# Patient Record
Sex: Male | Born: 1980 | Race: White | Hispanic: No | Marital: Married | Smoking: Never smoker
Health system: Southern US, Community
[De-identification: ages and names within clinical notes are randomized; demographics above are authoritative.]

## PROBLEM LIST (undated history)

## (undated) DIAGNOSIS — I341 Nonrheumatic mitral (valve) prolapse: Secondary | ICD-10-CM

## (undated) DIAGNOSIS — J45990 Exercise induced bronchospasm: Secondary | ICD-10-CM

## (undated) DIAGNOSIS — I493 Ventricular premature depolarization: Secondary | ICD-10-CM

## (undated) DIAGNOSIS — J111 Influenza due to unidentified influenza virus with other respiratory manifestations: Secondary | ICD-10-CM

## (undated) DIAGNOSIS — G43909 Migraine, unspecified, not intractable, without status migrainosus: Secondary | ICD-10-CM

## (undated) DIAGNOSIS — K635 Polyp of colon: Secondary | ICD-10-CM

## (undated) HISTORY — PX: HERNIA REPAIR: SHX51

## (undated) HISTORY — DX: Polyp of colon: K63.5

## (undated) HISTORY — DX: Exercise induced bronchospasm: J45.990

## (undated) HISTORY — DX: Nonrheumatic mitral (valve) prolapse: I34.1

## (undated) HISTORY — PX: COLONOSCOPY: SHX174

## (undated) HISTORY — DX: Migraine, unspecified, not intractable, without status migrainosus: G43.909

## (undated) HISTORY — PX: WISDOM TOOTH EXTRACTION: SHX21

## (undated) HISTORY — DX: Ventricular premature depolarization: I49.3

---

## 2009-01-29 ENCOUNTER — Emergency Department (HOSPITAL_BASED_OUTPATIENT_CLINIC_OR_DEPARTMENT_OTHER): Admission: EM | Admit: 2009-01-29 | Discharge: 2009-01-29 | Payer: Self-pay | Admitting: Emergency Medicine

## 2009-01-29 ENCOUNTER — Ambulatory Visit: Payer: Self-pay | Admitting: Diagnostic Radiology

## 2009-02-02 DIAGNOSIS — R002 Palpitations: Secondary | ICD-10-CM | POA: Insufficient documentation

## 2009-05-21 ENCOUNTER — Emergency Department (HOSPITAL_BASED_OUTPATIENT_CLINIC_OR_DEPARTMENT_OTHER): Admission: EM | Admit: 2009-05-21 | Discharge: 2009-05-21 | Payer: Self-pay | Admitting: Emergency Medicine

## 2009-05-21 ENCOUNTER — Ambulatory Visit: Payer: Self-pay | Admitting: Diagnostic Radiology

## 2011-03-18 LAB — URINALYSIS, ROUTINE W REFLEX MICROSCOPIC
Glucose, UA: NEGATIVE mg/dL
Hgb urine dipstick: NEGATIVE
Ketones, ur: 15 mg/dL — AB
Protein, ur: NEGATIVE mg/dL
Urobilinogen, UA: 1 mg/dL (ref 0.0–1.0)

## 2011-03-26 LAB — COMPREHENSIVE METABOLIC PANEL
Albumin: 4.4 g/dL (ref 3.5–5.2)
Alkaline Phosphatase: 48 U/L (ref 39–117)
BUN: 23 mg/dL (ref 6–23)
Calcium: 9.4 mg/dL (ref 8.4–10.5)
Creatinine, Ser: 1.2 mg/dL (ref 0.4–1.5)
Glucose, Bld: 117 mg/dL — ABNORMAL HIGH (ref 70–99)
Total Protein: 7.1 g/dL (ref 6.0–8.3)

## 2011-03-26 LAB — CBC
HCT: 42.6 % (ref 39.0–52.0)
Hemoglobin: 14.4 g/dL (ref 13.0–17.0)
MCHC: 33.9 g/dL (ref 30.0–36.0)
MCV: 93.4 fL (ref 78.0–100.0)
Platelets: 205 10*3/uL (ref 150–400)
RDW: 11.8 % (ref 11.5–15.5)

## 2011-03-26 LAB — POCT TOXICOLOGY PANEL

## 2011-03-26 LAB — DIFFERENTIAL
Basophils Relative: 1 % (ref 0–1)
Lymphocytes Relative: 22 % (ref 12–46)
Lymphs Abs: 1.6 10*3/uL (ref 0.7–4.0)
Monocytes Absolute: 0.4 10*3/uL (ref 0.1–1.0)
Monocytes Relative: 6 % (ref 3–12)
Neutro Abs: 5.3 10*3/uL (ref 1.7–7.7)
Neutrophils Relative %: 70 % (ref 43–77)

## 2011-03-26 LAB — POCT CARDIAC MARKERS
CKMB, poc: <1 ng/mL — ABNORMAL LOW (ref 1.0–8.0)
Myoglobin, poc: 51 ng/mL (ref 12–200)
Troponin i, poc: <0.05 ng/mL (ref 0.00–0.09)

## 2012-06-22 DIAGNOSIS — R42 Dizziness and giddiness: Secondary | ICD-10-CM | POA: Insufficient documentation

## 2012-06-22 DIAGNOSIS — Z9109 Other allergy status, other than to drugs and biological substances: Secondary | ICD-10-CM

## 2012-06-22 DIAGNOSIS — Z8669 Personal history of other diseases of the nervous system and sense organs: Secondary | ICD-10-CM

## 2012-06-22 DIAGNOSIS — B9689 Other specified bacterial agents as the cause of diseases classified elsewhere: Secondary | ICD-10-CM | POA: Insufficient documentation

## 2012-06-22 DIAGNOSIS — L0889 Other specified local infections of the skin and subcutaneous tissue: Secondary | ICD-10-CM

## 2012-06-22 HISTORY — DX: Other allergy status, other than to drugs and biological substances: Z91.09

## 2012-06-22 HISTORY — DX: Personal history of other diseases of the nervous system and sense organs: Z86.69

## 2012-10-09 LAB — CBC AND DIFFERENTIAL: Hemoglobin: 15.7 g/dL (ref 13.5–17.5)

## 2012-10-09 LAB — HEPATIC FUNCTION PANEL
ALT: 25 U/L (ref 10–40)
AST: 20 U/L (ref 14–40)

## 2012-10-09 LAB — LIPID PANEL
Cholesterol: 185 mg/dL (ref 0–200)
HDL: 48 mg/dL (ref 35–70)
LDL CALC: 119 mg/dL
Triglycerides: 90 mg/dL (ref 40–160)

## 2012-10-09 LAB — TSH: TSH: 1.82 u[IU]/mL (ref 0.41–5.90)

## 2012-10-09 LAB — BASIC METABOLIC PANEL: CREATININE: 1.1 mg/dL (ref 0.6–1.3)

## 2016-03-11 DIAGNOSIS — M9902 Segmental and somatic dysfunction of thoracic region: Secondary | ICD-10-CM | POA: Diagnosis not present

## 2016-03-11 DIAGNOSIS — J301 Allergic rhinitis due to pollen: Secondary | ICD-10-CM | POA: Diagnosis not present

## 2016-03-11 DIAGNOSIS — M47898 Other spondylosis, sacral and sacrococcygeal region: Secondary | ICD-10-CM | POA: Diagnosis not present

## 2016-03-11 DIAGNOSIS — M9904 Segmental and somatic dysfunction of sacral region: Secondary | ICD-10-CM | POA: Diagnosis not present

## 2016-03-11 DIAGNOSIS — M9903 Segmental and somatic dysfunction of lumbar region: Secondary | ICD-10-CM | POA: Diagnosis not present

## 2016-03-11 DIAGNOSIS — J3089 Other allergic rhinitis: Secondary | ICD-10-CM | POA: Diagnosis not present

## 2016-03-12 DIAGNOSIS — M47898 Other spondylosis, sacral and sacrococcygeal region: Secondary | ICD-10-CM | POA: Diagnosis not present

## 2016-03-12 DIAGNOSIS — M9903 Segmental and somatic dysfunction of lumbar region: Secondary | ICD-10-CM | POA: Diagnosis not present

## 2016-03-12 DIAGNOSIS — M9902 Segmental and somatic dysfunction of thoracic region: Secondary | ICD-10-CM | POA: Diagnosis not present

## 2016-03-12 DIAGNOSIS — M9904 Segmental and somatic dysfunction of sacral region: Secondary | ICD-10-CM | POA: Diagnosis not present

## 2016-03-19 DIAGNOSIS — M9902 Segmental and somatic dysfunction of thoracic region: Secondary | ICD-10-CM | POA: Diagnosis not present

## 2016-03-19 DIAGNOSIS — M9904 Segmental and somatic dysfunction of sacral region: Secondary | ICD-10-CM | POA: Diagnosis not present

## 2016-03-19 DIAGNOSIS — M47898 Other spondylosis, sacral and sacrococcygeal region: Secondary | ICD-10-CM | POA: Diagnosis not present

## 2016-03-19 DIAGNOSIS — M9903 Segmental and somatic dysfunction of lumbar region: Secondary | ICD-10-CM | POA: Diagnosis not present

## 2016-03-28 DIAGNOSIS — M47898 Other spondylosis, sacral and sacrococcygeal region: Secondary | ICD-10-CM | POA: Diagnosis not present

## 2016-03-28 DIAGNOSIS — J3089 Other allergic rhinitis: Secondary | ICD-10-CM | POA: Diagnosis not present

## 2016-03-28 DIAGNOSIS — M9902 Segmental and somatic dysfunction of thoracic region: Secondary | ICD-10-CM | POA: Diagnosis not present

## 2016-03-28 DIAGNOSIS — M9904 Segmental and somatic dysfunction of sacral region: Secondary | ICD-10-CM | POA: Diagnosis not present

## 2016-03-28 DIAGNOSIS — J301 Allergic rhinitis due to pollen: Secondary | ICD-10-CM | POA: Diagnosis not present

## 2016-03-28 DIAGNOSIS — M9903 Segmental and somatic dysfunction of lumbar region: Secondary | ICD-10-CM | POA: Diagnosis not present

## 2016-04-01 DIAGNOSIS — M9904 Segmental and somatic dysfunction of sacral region: Secondary | ICD-10-CM | POA: Diagnosis not present

## 2016-04-01 DIAGNOSIS — M47898 Other spondylosis, sacral and sacrococcygeal region: Secondary | ICD-10-CM | POA: Diagnosis not present

## 2016-04-01 DIAGNOSIS — M9902 Segmental and somatic dysfunction of thoracic region: Secondary | ICD-10-CM | POA: Diagnosis not present

## 2016-04-01 DIAGNOSIS — M9903 Segmental and somatic dysfunction of lumbar region: Secondary | ICD-10-CM | POA: Diagnosis not present

## 2016-04-08 DIAGNOSIS — M9903 Segmental and somatic dysfunction of lumbar region: Secondary | ICD-10-CM | POA: Diagnosis not present

## 2016-04-08 DIAGNOSIS — M9904 Segmental and somatic dysfunction of sacral region: Secondary | ICD-10-CM | POA: Diagnosis not present

## 2016-04-08 DIAGNOSIS — M9902 Segmental and somatic dysfunction of thoracic region: Secondary | ICD-10-CM | POA: Diagnosis not present

## 2016-04-08 DIAGNOSIS — M47898 Other spondylosis, sacral and sacrococcygeal region: Secondary | ICD-10-CM | POA: Diagnosis not present

## 2016-04-10 DIAGNOSIS — J3089 Other allergic rhinitis: Secondary | ICD-10-CM | POA: Diagnosis not present

## 2016-04-10 DIAGNOSIS — J301 Allergic rhinitis due to pollen: Secondary | ICD-10-CM | POA: Diagnosis not present

## 2016-04-25 DIAGNOSIS — M9904 Segmental and somatic dysfunction of sacral region: Secondary | ICD-10-CM | POA: Diagnosis not present

## 2016-04-25 DIAGNOSIS — M47898 Other spondylosis, sacral and sacrococcygeal region: Secondary | ICD-10-CM | POA: Diagnosis not present

## 2016-04-25 DIAGNOSIS — M9902 Segmental and somatic dysfunction of thoracic region: Secondary | ICD-10-CM | POA: Diagnosis not present

## 2016-04-25 DIAGNOSIS — M9903 Segmental and somatic dysfunction of lumbar region: Secondary | ICD-10-CM | POA: Diagnosis not present

## 2016-04-26 DIAGNOSIS — J209 Acute bronchitis, unspecified: Secondary | ICD-10-CM | POA: Diagnosis not present

## 2016-04-26 DIAGNOSIS — J0140 Acute pansinusitis, unspecified: Secondary | ICD-10-CM | POA: Diagnosis not present

## 2016-05-02 DIAGNOSIS — M47898 Other spondylosis, sacral and sacrococcygeal region: Secondary | ICD-10-CM | POA: Diagnosis not present

## 2016-05-02 DIAGNOSIS — M9902 Segmental and somatic dysfunction of thoracic region: Secondary | ICD-10-CM | POA: Diagnosis not present

## 2016-05-02 DIAGNOSIS — M9903 Segmental and somatic dysfunction of lumbar region: Secondary | ICD-10-CM | POA: Diagnosis not present

## 2016-05-02 DIAGNOSIS — M9904 Segmental and somatic dysfunction of sacral region: Secondary | ICD-10-CM | POA: Diagnosis not present

## 2016-05-13 DIAGNOSIS — R05 Cough: Secondary | ICD-10-CM | POA: Diagnosis not present

## 2016-05-13 DIAGNOSIS — J209 Acute bronchitis, unspecified: Secondary | ICD-10-CM | POA: Diagnosis not present

## 2016-05-21 DIAGNOSIS — M9903 Segmental and somatic dysfunction of lumbar region: Secondary | ICD-10-CM | POA: Diagnosis not present

## 2016-05-21 DIAGNOSIS — M47898 Other spondylosis, sacral and sacrococcygeal region: Secondary | ICD-10-CM | POA: Diagnosis not present

## 2016-05-21 DIAGNOSIS — M9904 Segmental and somatic dysfunction of sacral region: Secondary | ICD-10-CM | POA: Diagnosis not present

## 2016-05-21 DIAGNOSIS — M9902 Segmental and somatic dysfunction of thoracic region: Secondary | ICD-10-CM | POA: Diagnosis not present

## 2016-06-19 DIAGNOSIS — L738 Other specified follicular disorders: Secondary | ICD-10-CM | POA: Diagnosis not present

## 2016-06-19 DIAGNOSIS — L0889 Other specified local infections of the skin and subcutaneous tissue: Secondary | ICD-10-CM | POA: Diagnosis not present

## 2016-06-20 DIAGNOSIS — J3089 Other allergic rhinitis: Secondary | ICD-10-CM | POA: Diagnosis not present

## 2016-06-20 DIAGNOSIS — J301 Allergic rhinitis due to pollen: Secondary | ICD-10-CM | POA: Diagnosis not present

## 2016-07-04 DIAGNOSIS — M47898 Other spondylosis, sacral and sacrococcygeal region: Secondary | ICD-10-CM | POA: Diagnosis not present

## 2016-07-04 DIAGNOSIS — J301 Allergic rhinitis due to pollen: Secondary | ICD-10-CM | POA: Diagnosis not present

## 2016-07-04 DIAGNOSIS — J3089 Other allergic rhinitis: Secondary | ICD-10-CM | POA: Diagnosis not present

## 2016-07-04 DIAGNOSIS — M9904 Segmental and somatic dysfunction of sacral region: Secondary | ICD-10-CM | POA: Diagnosis not present

## 2016-07-04 DIAGNOSIS — M9903 Segmental and somatic dysfunction of lumbar region: Secondary | ICD-10-CM | POA: Diagnosis not present

## 2016-07-04 DIAGNOSIS — M9902 Segmental and somatic dysfunction of thoracic region: Secondary | ICD-10-CM | POA: Diagnosis not present

## 2016-07-29 DIAGNOSIS — M9904 Segmental and somatic dysfunction of sacral region: Secondary | ICD-10-CM | POA: Diagnosis not present

## 2016-07-29 DIAGNOSIS — J301 Allergic rhinitis due to pollen: Secondary | ICD-10-CM | POA: Diagnosis not present

## 2016-07-29 DIAGNOSIS — J3089 Other allergic rhinitis: Secondary | ICD-10-CM | POA: Diagnosis not present

## 2016-07-29 DIAGNOSIS — M9903 Segmental and somatic dysfunction of lumbar region: Secondary | ICD-10-CM | POA: Diagnosis not present

## 2016-07-29 DIAGNOSIS — M47898 Other spondylosis, sacral and sacrococcygeal region: Secondary | ICD-10-CM | POA: Diagnosis not present

## 2016-07-29 DIAGNOSIS — M9902 Segmental and somatic dysfunction of thoracic region: Secondary | ICD-10-CM | POA: Diagnosis not present

## 2016-08-05 DIAGNOSIS — J4520 Mild intermittent asthma, uncomplicated: Secondary | ICD-10-CM | POA: Diagnosis not present

## 2016-08-05 DIAGNOSIS — J3089 Other allergic rhinitis: Secondary | ICD-10-CM | POA: Diagnosis not present

## 2016-08-05 DIAGNOSIS — H1010 Acute atopic conjunctivitis, unspecified eye: Secondary | ICD-10-CM | POA: Diagnosis not present

## 2016-08-05 DIAGNOSIS — J301 Allergic rhinitis due to pollen: Secondary | ICD-10-CM | POA: Diagnosis not present

## 2016-08-13 DIAGNOSIS — J3089 Other allergic rhinitis: Secondary | ICD-10-CM | POA: Diagnosis not present

## 2016-08-13 DIAGNOSIS — J301 Allergic rhinitis due to pollen: Secondary | ICD-10-CM | POA: Diagnosis not present

## 2016-09-04 DIAGNOSIS — M2241 Chondromalacia patellae, right knee: Secondary | ICD-10-CM | POA: Diagnosis not present

## 2016-09-05 ENCOUNTER — Encounter: Payer: Self-pay | Admitting: Family Medicine

## 2016-09-05 ENCOUNTER — Ambulatory Visit (INDEPENDENT_AMBULATORY_CARE_PROVIDER_SITE_OTHER): Payer: BLUE CROSS/BLUE SHIELD | Admitting: Family Medicine

## 2016-09-05 ENCOUNTER — Other Ambulatory Visit: Payer: Self-pay | Admitting: Family Medicine

## 2016-09-05 VITALS — BP 137/82 | HR 75 | Ht >= 80 in | Wt 223.0 lb

## 2016-09-05 DIAGNOSIS — Z114 Encounter for screening for human immunodeficiency virus [HIV]: Secondary | ICD-10-CM

## 2016-09-05 DIAGNOSIS — I341 Nonrheumatic mitral (valve) prolapse: Secondary | ICD-10-CM | POA: Insufficient documentation

## 2016-09-05 DIAGNOSIS — I493 Ventricular premature depolarization: Secondary | ICD-10-CM

## 2016-09-05 DIAGNOSIS — Z9852 Vasectomy status: Secondary | ICD-10-CM

## 2016-09-05 DIAGNOSIS — Z23 Encounter for immunization: Secondary | ICD-10-CM | POA: Diagnosis not present

## 2016-09-05 DIAGNOSIS — Z0000 Encounter for general adult medical examination without abnormal findings: Secondary | ICD-10-CM

## 2016-09-05 DIAGNOSIS — IMO0001 Reserved for inherently not codable concepts without codable children: Secondary | ICD-10-CM | POA: Insufficient documentation

## 2016-09-05 HISTORY — DX: Ventricular premature depolarization: I49.3

## 2016-09-05 HISTORY — DX: Nonrheumatic mitral (valve) prolapse: I34.1

## 2016-09-05 NOTE — Patient Instructions (Signed)
Thank you for coming in today. Return in 1 year or sooner if needed.  Get fasting labs soon.  You should hear from [facility] soon.

## 2016-09-05 NOTE — Progress Notes (Signed)
Travis Li is a 35 y.o. male who presents to [facility] today to establish care.  He has several minor health issues, but is overall doing very well. Patient works as a Geophysical data processor for [employer].  He is married with a 24 year old son.  He is interested in a referral for vasectomy.   1.  History of migraines:  Well controlled.  Not taking any prophylactic therapy. He was previously on verapamil and rizatriptan which made his headaches worse.    2.  Bilateral knee pain: Secondary to lack of cartilage per patient.  He is seeing an orthopedist for this.  His symptoms are mild at this time.  3.  Health maintenance:  Due for flu shot and HIV screening.  Of note, patient has a family history of diabetes and cholesterol problems.     No past medical history on file. No past surgical history on file. Social History  Substance Use Topics  . Smoking status: Never Smoker  . Smokeless tobacco: Never Used  . Alcohol use Yes   family history includes Diabetes in his father; Hypercholesterolemia in his father and mother.  ROS as above: No headache, visual changes, nausea, vomiting, diarrhea, constipation, dizziness, abdominal pain, skin rash, fevers, chills, night sweats, weight loss, swollen lymph nodes, body aches, joint swelling, muscle aches, chest pain, shortness of breath, mood changes, visual or auditory hallucinations.   Medications: Current Outpatient Prescriptions  Medication Sig Dispense Refill  . Albuterol Sulfate 108 (90 Base) MCG/ACT AEPB Inhale into the lungs.    . fexofenadine (ALLEGRA) 180 MG tablet Take by mouth.     No current facility-administered medications for this visit.    Allergies  Allergen Reactions  . Minocycline Hives    Lips swell     Exam:  BP 137/82   Pulse 75   Ht 6\' 8"  (2.032 m)   Wt 223 lb (101.2 kg)   BMI 24.50 kg/m  Gen: Well  NAD HEENT: EOMI,  MMM Lungs: Normal work of breathing. CTABL Heart: RRR no MRG Abd: NABS, Soft. Nondistended, Nontender Exts: Brisk capillary refill, warm and well perfused.   No results found for this or any previous visit (from the past 24 hour(s)). No results found.    Assessment and Plan: 35 y.o. male presents to establish care and for a wellness visit.  Patient is doing well.  He has no current complaints.  There were several minor health issues addressed:  1.  History of migraines: well controlled without medication.    2.  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 Encouraged patient to maintain good healthy weight to prevent osteoarthritis.  He can follow up with [place] for injections if his pain worsens  3.  Health maintenance: - Flu shot given - CBC, CMP. Lipids, A1c, TSH, Vitamin D - HIV screening   Orders Placed This Encounter  Procedures  . Flu Vaccine QUAD 36+ mos IM  . CBC  . COMPLETE METABOLIC PANEL WITH GFR  . Hemoglobin A1c  . Lipid panel  . TSH  . VITAMIN D 25 Hydroxy (Vit-D Deficiency, Fractures)  . HIV antibody  . Ambulatory referral to Urology    Referral Priority:   Routine    Referral Type:   Consultation    Referral Reason:   Specialty Services Required    Requested Specialty:   Urology    Number of Visits Requested:   1    Discussed  warning signs or symptoms. Please see discharge instructions. Patient expresses understanding.

## 2016-09-11 DIAGNOSIS — J301 Allergic rhinitis due to pollen: Secondary | ICD-10-CM | POA: Diagnosis not present

## 2016-09-11 DIAGNOSIS — J3089 Other allergic rhinitis: Secondary | ICD-10-CM | POA: Diagnosis not present

## 2016-09-18 DIAGNOSIS — M9902 Segmental and somatic dysfunction of thoracic region: Secondary | ICD-10-CM | POA: Diagnosis not present

## 2016-09-18 DIAGNOSIS — M9904 Segmental and somatic dysfunction of sacral region: Secondary | ICD-10-CM | POA: Diagnosis not present

## 2016-09-18 DIAGNOSIS — J301 Allergic rhinitis due to pollen: Secondary | ICD-10-CM | POA: Diagnosis not present

## 2016-09-18 DIAGNOSIS — M9903 Segmental and somatic dysfunction of lumbar region: Secondary | ICD-10-CM | POA: Diagnosis not present

## 2016-09-18 DIAGNOSIS — M47898 Other spondylosis, sacral and sacrococcygeal region: Secondary | ICD-10-CM | POA: Diagnosis not present

## 2016-09-18 DIAGNOSIS — J3089 Other allergic rhinitis: Secondary | ICD-10-CM | POA: Diagnosis not present

## 2016-09-25 DIAGNOSIS — H6693 Otitis media, unspecified, bilateral: Secondary | ICD-10-CM | POA: Diagnosis not present

## 2016-09-25 DIAGNOSIS — J3489 Other specified disorders of nose and nasal sinuses: Secondary | ICD-10-CM | POA: Diagnosis not present

## 2016-09-27 DIAGNOSIS — H65191 Other acute nonsuppurative otitis media, right ear: Secondary | ICD-10-CM | POA: Diagnosis not present

## 2016-10-03 DIAGNOSIS — J301 Allergic rhinitis due to pollen: Secondary | ICD-10-CM | POA: Diagnosis not present

## 2016-10-03 DIAGNOSIS — J3089 Other allergic rhinitis: Secondary | ICD-10-CM | POA: Diagnosis not present

## 2016-10-10 ENCOUNTER — Encounter: Payer: Self-pay | Admitting: Family Medicine

## 2017-01-06 DIAGNOSIS — M9904 Segmental and somatic dysfunction of sacral region: Secondary | ICD-10-CM | POA: Diagnosis not present

## 2017-01-06 DIAGNOSIS — M9902 Segmental and somatic dysfunction of thoracic region: Secondary | ICD-10-CM | POA: Diagnosis not present

## 2017-01-06 DIAGNOSIS — M47898 Other spondylosis, sacral and sacrococcygeal region: Secondary | ICD-10-CM | POA: Diagnosis not present

## 2017-01-06 DIAGNOSIS — M9903 Segmental and somatic dysfunction of lumbar region: Secondary | ICD-10-CM | POA: Diagnosis not present

## 2017-03-11 DIAGNOSIS — M47898 Other spondylosis, sacral and sacrococcygeal region: Secondary | ICD-10-CM | POA: Diagnosis not present

## 2017-03-11 DIAGNOSIS — M9903 Segmental and somatic dysfunction of lumbar region: Secondary | ICD-10-CM | POA: Diagnosis not present

## 2017-03-11 DIAGNOSIS — M9904 Segmental and somatic dysfunction of sacral region: Secondary | ICD-10-CM | POA: Diagnosis not present

## 2017-03-11 DIAGNOSIS — M9902 Segmental and somatic dysfunction of thoracic region: Secondary | ICD-10-CM | POA: Diagnosis not present

## 2017-03-27 DIAGNOSIS — L738 Other specified follicular disorders: Secondary | ICD-10-CM | POA: Diagnosis not present

## 2017-03-27 DIAGNOSIS — L739 Follicular disorder, unspecified: Secondary | ICD-10-CM | POA: Diagnosis not present

## 2017-03-31 DIAGNOSIS — M47898 Other spondylosis, sacral and sacrococcygeal region: Secondary | ICD-10-CM | POA: Diagnosis not present

## 2017-03-31 DIAGNOSIS — M9904 Segmental and somatic dysfunction of sacral region: Secondary | ICD-10-CM | POA: Diagnosis not present

## 2017-03-31 DIAGNOSIS — M9903 Segmental and somatic dysfunction of lumbar region: Secondary | ICD-10-CM | POA: Diagnosis not present

## 2017-03-31 DIAGNOSIS — M9902 Segmental and somatic dysfunction of thoracic region: Secondary | ICD-10-CM | POA: Diagnosis not present

## 2017-04-24 DIAGNOSIS — M47898 Other spondylosis, sacral and sacrococcygeal region: Secondary | ICD-10-CM | POA: Diagnosis not present

## 2017-04-24 DIAGNOSIS — M9902 Segmental and somatic dysfunction of thoracic region: Secondary | ICD-10-CM | POA: Diagnosis not present

## 2017-04-24 DIAGNOSIS — M9904 Segmental and somatic dysfunction of sacral region: Secondary | ICD-10-CM | POA: Diagnosis not present

## 2017-04-24 DIAGNOSIS — M9903 Segmental and somatic dysfunction of lumbar region: Secondary | ICD-10-CM | POA: Diagnosis not present

## 2017-05-19 DIAGNOSIS — M9903 Segmental and somatic dysfunction of lumbar region: Secondary | ICD-10-CM | POA: Diagnosis not present

## 2017-05-19 DIAGNOSIS — M9904 Segmental and somatic dysfunction of sacral region: Secondary | ICD-10-CM | POA: Diagnosis not present

## 2017-05-19 DIAGNOSIS — M47898 Other spondylosis, sacral and sacrococcygeal region: Secondary | ICD-10-CM | POA: Diagnosis not present

## 2017-05-19 DIAGNOSIS — M9902 Segmental and somatic dysfunction of thoracic region: Secondary | ICD-10-CM | POA: Diagnosis not present

## 2017-06-17 DIAGNOSIS — M9904 Segmental and somatic dysfunction of sacral region: Secondary | ICD-10-CM | POA: Diagnosis not present

## 2017-06-17 DIAGNOSIS — M9903 Segmental and somatic dysfunction of lumbar region: Secondary | ICD-10-CM | POA: Diagnosis not present

## 2017-06-17 DIAGNOSIS — M9902 Segmental and somatic dysfunction of thoracic region: Secondary | ICD-10-CM | POA: Diagnosis not present

## 2017-06-17 DIAGNOSIS — M47898 Other spondylosis, sacral and sacrococcygeal region: Secondary | ICD-10-CM | POA: Diagnosis not present

## 2017-07-23 DIAGNOSIS — M47898 Other spondylosis, sacral and sacrococcygeal region: Secondary | ICD-10-CM | POA: Diagnosis not present

## 2017-07-23 DIAGNOSIS — M9902 Segmental and somatic dysfunction of thoracic region: Secondary | ICD-10-CM | POA: Diagnosis not present

## 2017-07-23 DIAGNOSIS — M9904 Segmental and somatic dysfunction of sacral region: Secondary | ICD-10-CM | POA: Diagnosis not present

## 2017-07-23 DIAGNOSIS — M9903 Segmental and somatic dysfunction of lumbar region: Secondary | ICD-10-CM | POA: Diagnosis not present

## 2017-09-01 DIAGNOSIS — M47898 Other spondylosis, sacral and sacrococcygeal region: Secondary | ICD-10-CM | POA: Diagnosis not present

## 2017-09-01 DIAGNOSIS — M9904 Segmental and somatic dysfunction of sacral region: Secondary | ICD-10-CM | POA: Diagnosis not present

## 2017-09-01 DIAGNOSIS — M9903 Segmental and somatic dysfunction of lumbar region: Secondary | ICD-10-CM | POA: Diagnosis not present

## 2017-09-01 DIAGNOSIS — M9902 Segmental and somatic dysfunction of thoracic region: Secondary | ICD-10-CM | POA: Diagnosis not present

## 2017-09-22 DIAGNOSIS — M9903 Segmental and somatic dysfunction of lumbar region: Secondary | ICD-10-CM | POA: Diagnosis not present

## 2017-09-22 DIAGNOSIS — M9904 Segmental and somatic dysfunction of sacral region: Secondary | ICD-10-CM | POA: Diagnosis not present

## 2017-09-22 DIAGNOSIS — M47898 Other spondylosis, sacral and sacrococcygeal region: Secondary | ICD-10-CM | POA: Diagnosis not present

## 2017-09-22 DIAGNOSIS — M9902 Segmental and somatic dysfunction of thoracic region: Secondary | ICD-10-CM | POA: Diagnosis not present

## 2017-09-29 DIAGNOSIS — M9904 Segmental and somatic dysfunction of sacral region: Secondary | ICD-10-CM | POA: Diagnosis not present

## 2017-09-29 DIAGNOSIS — M9903 Segmental and somatic dysfunction of lumbar region: Secondary | ICD-10-CM | POA: Diagnosis not present

## 2017-09-29 DIAGNOSIS — M9902 Segmental and somatic dysfunction of thoracic region: Secondary | ICD-10-CM | POA: Diagnosis not present

## 2017-09-29 DIAGNOSIS — M47898 Other spondylosis, sacral and sacrococcygeal region: Secondary | ICD-10-CM | POA: Diagnosis not present

## 2017-10-11 ENCOUNTER — Encounter: Payer: Self-pay | Admitting: Emergency Medicine

## 2017-10-11 ENCOUNTER — Emergency Department (INDEPENDENT_AMBULATORY_CARE_PROVIDER_SITE_OTHER)
Admission: EM | Admit: 2017-10-11 | Discharge: 2017-10-11 | Disposition: A | Discharge status: Other Acute Inpatient Hospital | Payer: BLUE CROSS/BLUE SHIELD | Source: Home / Self Care | Attending: Family Medicine | Admitting: Family Medicine

## 2017-10-11 DIAGNOSIS — Z883 Allergy status to other anti-infective agents status: Secondary | ICD-10-CM | POA: Diagnosis not present

## 2017-10-11 DIAGNOSIS — J9811 Atelectasis: Secondary | ICD-10-CM | POA: Diagnosis not present

## 2017-10-11 DIAGNOSIS — R079 Chest pain, unspecified: Secondary | ICD-10-CM

## 2017-10-11 DIAGNOSIS — R5383 Other fatigue: Secondary | ICD-10-CM

## 2017-10-11 DIAGNOSIS — R058 Other specified cough: Secondary | ICD-10-CM

## 2017-10-11 DIAGNOSIS — Z79899 Other long term (current) drug therapy: Secondary | ICD-10-CM | POA: Diagnosis not present

## 2017-10-11 DIAGNOSIS — J984 Other disorders of lung: Secondary | ICD-10-CM | POA: Diagnosis not present

## 2017-10-11 DIAGNOSIS — R918 Other nonspecific abnormal finding of lung field: Secondary | ICD-10-CM | POA: Diagnosis not present

## 2017-10-11 DIAGNOSIS — R05 Cough: Secondary | ICD-10-CM

## 2017-10-11 DIAGNOSIS — I341 Nonrheumatic mitral (valve) prolapse: Secondary | ICD-10-CM | POA: Diagnosis not present

## 2017-10-11 NOTE — ED Triage Notes (Signed)
Patient presents to [facility] with C/O non productive cough while flying home today. He states that his chest hurt, that he had a headache and nasal drainage. Denies fever. History of Asthma, he states that he has missed taking allergy medication while on vacation.

## 2017-10-11 NOTE — ED Provider Notes (Signed)
[name]    CSN: [id number] Arrival date & time: 10/11/17  1643     History   Chief Complaint Chief Complaint  Patient presents with  . Cough  . Chest Pain    HPI Travis Li is a 36 y.o. male.   HPI  Travis Li is a 36 y.o. male presenting to UC with wife c/o centralized chest pain that started while he was flying back from [place] today, associated dry cough.  Chest pain is described as intermittent sharp in nature.  3/10 at worst.  Associated HA and nasal drainage.  He does have a hx of asthma and allergies. He missed taking his allergy medication while on vacation but used his inhaler with minimal relief during the flight. Denies hx of blood clots. No leg pain or swelling.  He did have a stress test about 7-8 years ago due to palpitations but was advised "everything was fine."  Pt's wife encouraged him to be evaluated for the cough and chest pain because he "slept all day yesterday" which is unusual for him.  Pt leaves for [place] on Monday 10/13/17 for business.    History reviewed. No pertinent past medical history.  Patient Active Problem List   Diagnosis Date Noted  . Mitral valve prolapse 09/05/2016  . PVC's (premature ventricular contractions) 09/05/2016  . Vasectomy planned 09/05/2016  . Chronic bacterial skin infection 06/22/2012  . Dizziness 06/22/2012  . Environmental allergies 06/22/2012  . History of migraine headaches 06/22/2012  . Personal history of other diseases of the nervous system and sense organs 06/22/2012  . Palpitations 02/02/2009    History reviewed. No pertinent surgical history.     Home Medications    Prior to Admission medications   Medication Sig Start Date End Date Taking? Authorizing Provider  Albuterol Sulfate 108 (90 Base) MCG/ACT AEPB Inhale into the lungs. 01/26/16 01/25/17  [provider]  fexofenadine (ALLEGRA) 180 MG tablet Take by mouth.    [provider]    Family History Family  History  Problem Relation Age of Onset  . Hypercholesterolemia Mother   . Diabetes Father   . Hypercholesterolemia Father     Social History Social History  Substance Use Topics  . Smoking status: Never Smoker  . Smokeless tobacco: Never Used  . Alcohol use Yes     Allergies   Minocycline   Review of Systems Review of Systems  Constitutional: Positive for fatigue. Negative for chills, diaphoresis and fever.  HENT: Positive for congestion and rhinorrhea. Negative for sore throat.   Respiratory: Positive for cough and chest tightness ( centralized). Negative for shortness of breath and wheezing.   Cardiovascular: Positive for chest pain. Negative for palpitations and leg swelling.  Gastrointestinal: Negative for diarrhea, nausea and vomiting.  Neurological: Negative for dizziness, light-headedness and headaches.     Physical Exam Triage Vital Signs ED Triage Vitals  Enc Vitals Group     BP 10/11/17 1704 134/87     Pulse Rate 10/11/17 1704 86     Resp 10/11/17 1704 16     Temp 10/11/17 1704 98.4 F (36.9 C)     Temp Source 10/11/17 1704 Oral     SpO2 10/11/17 1704 96 %     Weight 10/11/17 1705 215 lb (97.5 kg)     Height 10/11/17 1705 6\' 7"  (2.007 m)     Head Circumference --      Peak Flow --      Pain Score --  Pain Loc --      Pain Edu? --      Excl. in [place]? --    No data found.   Updated Vital Signs BP 134/87 (BP Location: Left Arm)   Pulse 86   Temp 98.4 F (36.9 C) (Oral)   Resp 16   Ht 6\' 7"  (2.007 m)   Wt 215 lb (97.5 kg)   SpO2 96%   BMI 24.22 kg/m   Visual Acuity Right Eye Distance:   Left Eye Distance:   Bilateral Distance:    Right Eye Near:   Left Eye Near:    Bilateral Near:     Physical Exam  Constitutional: He is oriented to person, place, and time. He appears well-developed and well-nourished. No distress.  HENT:  Head: Normocephalic and atraumatic.  Mouth/Throat: Oropharynx is clear and moist.  Eyes: EOM are normal.    Neck: Normal range of motion. Neck supple.  Cardiovascular: Normal rate and regular rhythm.   Pulmonary/Chest: Effort normal and breath sounds normal. No respiratory distress. He has no wheezes. He has no rales. He exhibits no tenderness.  Musculoskeletal: Normal range of motion.  Neurological: He is alert and oriented to person, place, and time.  Skin: Skin is warm and dry. He is not diaphoretic.  Psychiatric: He has a normal mood and affect. His behavior is normal.  Nursing note and vitals reviewed.    UC Treatments / Results  Labs (all labs ordered are listed, but only abnormal results are displayed) Labs Reviewed - No data to display  EKG  EKG Interpretation None       Radiology No results found.  Procedures Procedures (including critical care time)  Medications Ordered in UC Medications - No data to display   Initial Impression / Assessment and Plan / UC Course  I have reviewed the triage vital signs and the nursing notes.  Pertinent labs & imaging results that were available during my care of the patient were reviewed by me and considered in my medical decision making (see chart for details).     Pt c/o chest pain and dry cough that started while he was on a flight back home today. Pt travels a lot for work.  Pt also has hx of palpitations and MVP. Vitals: WNL and lungs: CTAB However, due to symptoms starting mid-flight and some cardiac hx and wife reporting pt more fatigued yesterday than normal, recommended pt go to emergency department for further evaluation. Pt and wife agreeable. Pt stable to go to [place] with wife driving to [facility].   Final Clinical Impressions(s) / UC Diagnoses   Final diagnoses:  Chest pain in adult  Dry cough  Fatigue, unspecified type    New Prescriptions Discharge Medication List as of 10/11/2017  5:07 PM       Controlled Substance Prescriptions [place] Controlled Substance Registry consulted? Not  Applicable   Tyrell Antonio 10/11/17 1745

## 2017-11-13 ENCOUNTER — Other Ambulatory Visit: Payer: Self-pay

## 2017-11-13 ENCOUNTER — Emergency Department (INDEPENDENT_AMBULATORY_CARE_PROVIDER_SITE_OTHER)
Admission: EM | Admit: 2017-11-13 | Discharge: 2017-11-13 | Disposition: A | Discharge status: Home / Self Care | Payer: BLUE CROSS/BLUE SHIELD | Source: Home / Self Care | Attending: Family Medicine | Admitting: Family Medicine

## 2017-11-13 DIAGNOSIS — J069 Acute upper respiratory infection, unspecified: Secondary | ICD-10-CM | POA: Diagnosis not present

## 2017-11-13 MED ORDER — AZITHROMYCIN 250 MG PO TABS: 250.0000 mg | ORAL_TABLET | Freq: Every day | ORAL | 0 refills | Status: DC

## 2017-11-13 MED ORDER — BENZONATATE 100 MG PO CAPS: 100.0000 mg | ORAL_CAPSULE | Freq: Three times a day (TID) | ORAL | 0 refills | Status: DC

## 2017-11-13 NOTE — ED Triage Notes (Signed)
Started a week ago tomorrow with a cough, the last few days productive with greenish mucous.  Coughing at night, denies fever.

## 2017-11-13 NOTE — ED Provider Notes (Signed)
[name]    CSN: [id number] Arrival date & time: 11/13/17  1935     History   Chief Complaint Chief Complaint  Patient presents with  . Cough    HPI Travis Li is a 36 y.o. male.   HPI Travis Li is a 36 y.o. male presenting to UC with c/o 1 week of gradually worsening cough, congestion with greens sputum production over the last 2-3 days.He has not tried anything for his symptoms. He does travel often for work and traveled to [place] recently for Thanksgiving and got sick shortly after that.  Denies fever, chills, n/v/d.    History reviewed. No pertinent past medical history.  Patient Active Problem List   Diagnosis Date Noted  . Mitral valve prolapse 09/05/2016  . PVC's (premature ventricular contractions) 09/05/2016  . Vasectomy planned 09/05/2016  . Chronic bacterial skin infection 06/22/2012  . Dizziness 06/22/2012  . Environmental allergies 06/22/2012  . History of migraine headaches 06/22/2012  . Personal history of other diseases of the nervous system and sense organs 06/22/2012  . Palpitations 02/02/2009    History reviewed. No pertinent surgical history.     Home Medications    Prior to Admission medications   Medication Sig Start Date End Date Taking? Authorizing Provider  Albuterol Sulfate 108 (90 Base) MCG/ACT AEPB Inhale into the lungs. 01/26/16 01/25/17  [provider]  azithromycin (ZITHROMAX) 250 MG tablet Take 1 tablet (250 mg total) by mouth daily. Take first 2 tablets together, then 1 every day until finished. 11/13/17   Noe Gens, PA-C  benzonatate (TESSALON) 100 MG capsule Take 1-2 capsules (100-200 mg total) by mouth every 8 (eight) hours. 11/13/17   Noe Gens, PA-C  fexofenadine (ALLEGRA) 180 MG tablet Take by mouth.    [provider]    Family History Family History  Problem Relation Age of Onset  . Hypercholesterolemia Mother   . Diabetes Father   . Hypercholesterolemia Father      Social History Social History   Tobacco Use  . Smoking status: Never Smoker  . Smokeless tobacco: Never Used  Substance Use Topics  . Alcohol use: Yes  . Drug use: No     Allergies   Minocycline   Review of Systems Review of Systems  Constitutional: Negative for chills and fever.  HENT: Positive for congestion, ear pain (bilateral pressure), postnasal drip, rhinorrhea and sore throat. Negative for trouble swallowing and voice change.   Respiratory: Positive for cough. Negative for shortness of breath.   Cardiovascular: Negative for chest pain and palpitations.  Gastrointestinal: Negative for abdominal pain, diarrhea, nausea and vomiting.  Musculoskeletal: Negative for arthralgias, back pain and myalgias.  Skin: Negative for rash.  Neurological: Positive for headaches. Negative for dizziness and light-headedness.     Physical Exam Triage Vital Signs ED Triage Vitals  Enc Vitals Group     BP 11/13/17 1945 (!) 138/91     Pulse Rate 11/13/17 1945 68     Resp --      Temp 11/13/17 1945 (!) 97.5 F (36.4 C)     Temp Source 11/13/17 1945 Oral     SpO2 11/13/17 1945 95 %     Weight 11/13/17 1945 228 lb (103.4 kg)     Height 11/13/17 1945 6\' 8"  (2.032 m)     Head Circumference --      Peak Flow --      Pain Score 11/13/17 1946 0     Pain Loc --  Pain Edu? --      Excl. in [place]? --    No data found.  Updated Vital Signs BP (!) 138/91 (BP Location: Right Arm)   Pulse 68   Temp (!) 97.5 F (36.4 C) (Oral)   Ht 6\' 8"  (2.032 m)   Wt 228 lb (103.4 kg)   SpO2 95%   BMI 25.05 kg/m   Visual Acuity Right Eye Distance:   Left Eye Distance:   Bilateral Distance:    Right Eye Near:   Left Eye Near:    Bilateral Near:     Physical Exam  Constitutional: He is oriented to person, place, and time. He appears well-developed and well-nourished. No distress.  HENT:  Head: Normocephalic and atraumatic.  Right Ear: Tympanic membrane normal.  Left Ear: Tympanic  membrane normal.  Nose: Mucosal edema present. Right sinus exhibits no maxillary sinus tenderness and no frontal sinus tenderness. Left sinus exhibits no maxillary sinus tenderness and no frontal sinus tenderness.  Mouth/Throat: Uvula is midline, oropharynx is clear and moist and mucous membranes are normal.  Eyes: EOM are normal.  Neck: Normal range of motion. Neck supple.  Cardiovascular: Normal rate and regular rhythm.  Pulmonary/Chest: Effort normal and breath sounds normal. No stridor. No respiratory distress. He has no wheezes. He has no rales.  Musculoskeletal: Normal range of motion.  Lymphadenopathy:    He has no cervical adenopathy.  Neurological: He is alert and oriented to person, place, and time.  Skin: Skin is warm and dry. He is not diaphoretic.  Psychiatric: He has a normal mood and affect. His behavior is normal.  Nursing note and vitals reviewed.    UC Treatments / Results  Labs (all labs ordered are listed, but only abnormal results are displayed) Labs Reviewed - No data to display  EKG  EKG Interpretation None       Radiology No results found.  Procedures Procedures (including critical care time)  Medications Ordered in UC Medications - No data to display   Initial Impression / Assessment and Plan / UC Course  I have reviewed the triage vital signs and the nursing notes.  Pertinent labs & imaging results that were available during my care of the patient were reviewed by me and considered in my medical decision making (see chart for details).     Worsening URI symptoms for 1 week. Due to frequent travel, will cover for atypical bacteria.  F/u with PCP in 1 week if not improving.   Final Clinical Impressions(s) / UC Diagnoses   Final diagnoses:  Upper respiratory tract infection, unspecified type    ED Discharge Orders        Ordered    azithromycin (ZITHROMAX) 250 MG tablet  Daily     11/13/17 2000    benzonatate (TESSALON) 100 MG capsule   Every 8 hours     11/13/17 2000       Controlled Substance Prescriptions [place] Controlled Substance Registry consulted? Not Applicable   Tyrell Antonio 11/13/17 2013

## 2017-11-13 NOTE — Discharge Instructions (Signed)
°  You may take 500mg acetaminophen every 4-6 hours or in combination with ibuprofen 400-600mg every 6-8 hours as needed for pain, inflammation, and fever. ° °Be sure to drink at least eight 8oz glasses of water to stay well hydrated and get at least 8 hours of sleep at night, preferably more while sick.  ° °Please take antibiotics as prescribed and be sure to complete entire course even if you start to feel better to ensure infection does not come back. ° °

## 2017-11-15 ENCOUNTER — Telehealth: Payer: Self-pay | Admitting: Emergency Medicine

## 2017-11-15 NOTE — Telephone Encounter (Signed)
LM for patient, advised to call back with questions or concerns. APeterman, CMA

## 2017-12-30 ENCOUNTER — Telehealth: Payer: Self-pay | Admitting: Family Medicine

## 2017-12-30 DIAGNOSIS — Z0000 Encounter for general adult medical examination without abnormal findings: Secondary | ICD-10-CM

## 2017-12-30 NOTE — Telephone Encounter (Signed)
Patient called scheduled physical for 01/30/18 and request to get basic labs done before appointment. Adv pt pcp will be out of office for a couple weeks and will send to nurse to put labs in. Pt req a call bac to know that labs have been ordered so he can go downstairs. Thanks

## 2017-12-31 NOTE — Telephone Encounter (Signed)
Labs been added called patient  And left a vm to advise. Rhonda Cunningham,CMA

## 2018-01-16 DIAGNOSIS — Z0000 Encounter for general adult medical examination without abnormal findings: Secondary | ICD-10-CM | POA: Diagnosis not present

## 2018-01-18 LAB — HEMOGLOBIN A1C
Hgb A1c MFr Bld: 5.2 % of total Hgb (ref <5.7)
Mean Plasma Glucose: 103 (calc)
eAG (mmol/L): 5.7 (calc)

## 2018-01-18 LAB — LIPID PANEL
Cholesterol: 210 mg/dL — ABNORMAL HIGH (ref <200)
HDL: 44 mg/dL (ref >40)
LDL Cholesterol (Calc): 136 mg/dL (calc) — ABNORMAL HIGH
Non-HDL Cholesterol (Calc): 166 mg/dL (calc) — ABNORMAL HIGH (ref <130)
Total CHOL/HDL Ratio: 4.8 (calc) (ref <5.0)
Triglycerides: 164 mg/dL — ABNORMAL HIGH (ref <150)

## 2018-01-18 LAB — COMPLETE METABOLIC PANEL WITH GFR
AG Ratio: 1.9 (calc) (ref 1.0–2.5)
ALBUMIN MSPROF: 4.9 g/dL (ref 3.6–5.1)
ALT: 26 U/L (ref 9–46)
AST: 19 U/L (ref 10–40)
Alkaline phosphatase (APISO): 48 U/L (ref 40–115)
BUN: 20 mg/dL (ref 7–25)
CO2: 29 mmol/L (ref 20–32)
Calcium: 10.1 mg/dL (ref 8.6–10.3)
Chloride: 103 mmol/L (ref 98–110)
Creat: 1.11 mg/dL (ref 0.60–1.35)
GFR, EST NON AFRICAN AMERICAN: 84 mL/min/{1.73_m2} (ref >=60)
GFR, Est African American: 98 mL/min/{1.73_m2} (ref >=60)
GLOBULIN: 2.6 g/dL (ref 1.9–3.7)
GLUCOSE: 91 mg/dL (ref 65–99)
Potassium: 4.4 mmol/L (ref 3.5–5.3)
Sodium: 140 mmol/L (ref 135–146)
TOTAL PROTEIN: 7.5 g/dL (ref 6.1–8.1)
Total Bilirubin: 0.8 mg/dL (ref 0.2–1.2)

## 2018-01-18 LAB — TSH: TSH: 1.38 m[IU]/L (ref 0.40–4.50)

## 2018-01-18 LAB — CBC
HEMATOCRIT: 47.3 % (ref 38.5–50.0)
HEMOGLOBIN: 16.6 g/dL (ref 13.2–17.1)
MCH: 31.7 pg (ref 27.0–33.0)
MCHC: 35.1 g/dL (ref 32.0–36.0)
MCV: 90.4 fL (ref 80.0–100.0)
MPV: 10.3 fL (ref 7.5–12.5)
Platelets: 234 10*3/uL (ref 140–400)
RBC: 5.23 10*6/uL (ref 4.20–5.80)
RDW: 11.9 % (ref 11.0–15.0)
WBC: 5.7 10*3/uL (ref 3.8–10.8)

## 2018-01-18 LAB — VITAMIN D 25 HYDROXY (VIT D DEFICIENCY, FRACTURES): Vit D, 25-Hydroxy: 36 ng/mL (ref 30–100)

## 2018-01-18 LAB — HIV ANTIBODY (ROUTINE TESTING W REFLEX): HIV 1&2 Ab, 4th Generation: NONREACTIVE

## 2018-01-30 ENCOUNTER — Encounter: Payer: Self-pay | Admitting: Family Medicine

## 2018-01-30 ENCOUNTER — Ambulatory Visit (INDEPENDENT_AMBULATORY_CARE_PROVIDER_SITE_OTHER): Payer: BLUE CROSS/BLUE SHIELD | Admitting: Family Medicine

## 2018-01-30 VITALS — BP 118/81 | HR 82 | Ht >= 80 in | Wt 224.0 lb

## 2018-01-30 DIAGNOSIS — Z0000 Encounter for general adult medical examination without abnormal findings: Secondary | ICD-10-CM

## 2018-01-30 DIAGNOSIS — Z6824 Body mass index (BMI) 24.0-24.9, adult: Secondary | ICD-10-CM

## 2018-01-30 DIAGNOSIS — E785 Hyperlipidemia, unspecified: Secondary | ICD-10-CM

## 2018-01-30 DIAGNOSIS — J45990 Exercise induced bronchospasm: Secondary | ICD-10-CM | POA: Diagnosis not present

## 2018-01-30 HISTORY — DX: Hyperlipidemia, unspecified: E78.5

## 2018-01-30 HISTORY — DX: Exercise induced bronchospasm: J45.990

## 2018-01-30 NOTE — Patient Instructions (Signed)
Thank you for coming in today. Recheck yearly.  Labs look good.  Think about Krill oil daily.  Continue exercise and self care.  Use a bike helmet.  Return sooner if needed.

## 2018-01-30 NOTE — Progress Notes (Signed)
Travis Li is a 37 y.o. male who presents to [facility]: [facility] today for well adult visit.  [name] is doing well.  He notes increased stress at work but notes that he is handling it pretty well.  He denies any symptoms today no fevers chills nausea vomiting or diarrhea.  He notes that he is trying to eat a healthy diet and exercise regularly.  He takes only Human resources officer for allergies.  He has exercise-induced asthma but does not require the use of albuterol inhaler.  He was considering a vasectomy a few years ago but is holding off on that now.  He denies any significant depression symptoms.  He feels satisfied at home and at work.  He notes that his mother was treated for endometrial cancer earlier this year.   Past Medical History:  Diagnosis Date  . 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 Mitral valve prolapse 09/05/2016  . PVC's (premature ventricular contractions) 09/05/2016   History reviewed. No pertinent surgical history. Social History   Tobacco Use  . Smoking status: Never Smoker  . Smokeless tobacco: Never Used  Substance Use Topics  . Alcohol use: Yes   family history includes Cancer in his mother; Diabetes in his father; Hypercholesterolemia in his father and mother.  ROS as above:  Medications: Current Outpatient Medications  Medication Sig Dispense Refill  . fexofenadine (ALLEGRA) 180 MG tablet Take by mouth.     No current facility-administered medications for this visit.    Allergies  Allergen Reactions  . Minocycline Hives    Lips swell    Health Maintenance Health Maintenance  Topic Date Due  . TETANUS/TDAP  07/15/2021  . INFLUENZA VACCINE  Completed  . HIV Screening  Completed     Exam:  BP 118/81   Pulse 82   Ht 6\' 8"  (2.032 m)   Wt 224 lb (101.6 kg)   BMI 24.61 kg/m  Gen: Well NAD HEENT: EOMI,  MMM Lungs: Normal work of breathing.  CTABL Heart: RRR no MRG Abd: NABS, Soft. Nondistended, Nontender Exts: Brisk capillary refill, warm and well perfused.  Psych: Alert and oriented normal speech thought process and affect. Skin: Few benign-appearing nondysplastic appearing nevi on back   Depression screen [facility] 2/9 01/30/2018  Decreased Interest 0  Down, Depressed, Hopeless 0  PHQ - 2 Score 0  Altered sleeping 0  Tired, decreased energy 0  Change in appetite 0  Feeling bad or failure about yourself  0  Trouble concentrating 0  Moving slowly or fidgety/restless 0  Suicidal thoughts 0  PHQ-9 Score 0  Difficult doing work/chores Not difficult at all     Chemistry      Component Value Date/Time   NA 140 01/16/2018 0809   K 4.4 01/16/2018 0809   CL 103 01/16/2018 0809   CO2 29 01/16/2018 0809   BUN 20 01/16/2018 0809   CREATININE 1.11 01/16/2018 0809      Component Value Date/Time   CALCIUM 10.1 01/16/2018 0809   ALKPHOS 48 01/29/2009 2002   AST 19 01/16/2018 0809   ALT 26 01/16/2018 0809   BILITOT 0.8 01/16/2018 0809     Lab Results  Component Value Date   CHOL 210 (H) 01/16/2018   HDL 44 01/16/2018   LDLCALC 119 10/09/2012   TRIG 164 (H) 01/16/2018   CHOLHDL 4.8 01/16/2018   Lab Results  Component Value Date   WBC 5.7 01/16/2018   HGB 16.6 01/16/2018  HCT 47.3 01/16/2018   MCV 90.4 01/16/2018   PLT 234 01/16/2018      Assessment and Plan: 38 y.o. male with well adult doing reasonably well.  Only issue is mild dyslipidemia.  Recommend continued careful diet exercise and healthy lifestyle.  Additionally recommend over-the-counter omega-3 fatty acids.  Otherwise recheck yearly.  Return sooner if needed.   No orders of the defined types were placed in this encounter.  No orders of the defined types were placed in this encounter.    Discussed warning signs or symptoms. Please see discharge instructions. Patient expresses understanding.

## 2018-04-20 DIAGNOSIS — L03012 Cellulitis of left finger: Secondary | ICD-10-CM | POA: Diagnosis not present

## 2018-05-04 DIAGNOSIS — L0390 Cellulitis, unspecified: Secondary | ICD-10-CM | POA: Diagnosis not present

## 2018-05-04 DIAGNOSIS — T07XXXA Unspecified multiple injuries, initial encounter: Secondary | ICD-10-CM | POA: Diagnosis not present

## 2018-05-04 DIAGNOSIS — W57XXXA Bitten or stung by nonvenomous insect and other nonvenomous arthropods, initial encounter: Secondary | ICD-10-CM | POA: Diagnosis not present

## 2018-06-10 DIAGNOSIS — R0789 Other chest pain: Secondary | ICD-10-CM | POA: Diagnosis not present

## 2018-09-02 ENCOUNTER — Encounter: Payer: Self-pay | Admitting: Family Medicine

## 2018-09-02 ENCOUNTER — Ambulatory Visit (INDEPENDENT_AMBULATORY_CARE_PROVIDER_SITE_OTHER): Payer: BLUE CROSS/BLUE SHIELD | Admitting: Family Medicine

## 2018-09-02 VITALS — BP 131/72 | HR 67 | Temp 97.6°F | Ht 79.0 in | Wt 224.0 lb

## 2018-09-02 DIAGNOSIS — J0101 Acute recurrent maxillary sinusitis: Secondary | ICD-10-CM | POA: Diagnosis not present

## 2018-09-02 MED ORDER — AZITHROMYCIN 250 MG PO TABS: 250.0000 mg | ORAL_TABLET | Freq: Every day | ORAL | 0 refills | Status: DC

## 2018-09-02 MED ORDER — IPRATROPIUM BROMIDE 0.06 % NA SOLN: 2.0000 | NASAL | 6 refills | Status: DC | PRN

## 2018-09-02 MED ORDER — BENZONATATE 200 MG PO CAPS: 200.0000 mg | ORAL_CAPSULE | Freq: Three times a day (TID) | ORAL | 0 refills | Status: DC | PRN

## 2018-09-02 MED ORDER — PREDNISONE 10 MG PO TABS: 30.0000 mg | ORAL_TABLET | Freq: Every day | ORAL | 0 refills | Status: DC

## 2018-09-02 NOTE — Patient Instructions (Signed)
Thank you for coming in today. Continue over the counter medicine.  Take prednisone for 5 days.  Use the nasal spray as needed.  Use tessalon pearles for cough as needed.  Fill and take azithromycin if worsening.  Call or go to the emergency room if you get worse, have trouble breathing, have chest pains, or palpitations.    Sinusitis, Adult Sinusitis is soreness and inflammation of your sinuses. Sinuses are hollow spaces in the bones around your face. They are located:  Around your eyes.  In the middle of your forehead.  Behind your nose.  In your cheekbones.  Your sinuses and nasal passages are lined with a stringy fluid (mucus). Mucus normally drains out of your sinuses. When your nasal tissues get inflamed or swollen, the mucus can get trapped or blocked so air cannot flow through your sinuses. This lets bacteria, viruses, and funguses grow, and that leads to infection. Follow these instructions at home: Medicines  Take, use, or apply over-the-counter and prescription medicines only as told by your doctor. These may include nasal sprays.  If you were prescribed an antibiotic medicine, take it as told by your doctor. Do not stop taking the antibiotic even if you start to feel better. Hydrate and Humidify  Drink enough water to keep your pee (urine) clear or pale yellow.  Use a cool mist humidifier to keep the humidity level in your home above 50%.  Breathe in steam for 10-15 minutes, 3-4 times a day or as told by your doctor. You can do this in the bathroom while a hot shower is running.  Try not to spend time in cool or dry air. Rest  Rest as much as possible.  Sleep with your head raised (elevated).  Make sure to get enough sleep each night. General instructions  Put a warm, moist washcloth on your face 3-4 times a day or as told by your doctor. This will help with discomfort.  Wash your hands often with soap and water. If there is no soap and water, use hand  sanitizer.  Do not smoke. Avoid being around people who are smoking (secondhand smoke).  Keep all follow-up visits as told by your doctor. This is important. Contact a doctor if:  You have a fever.  Your symptoms get worse.  Your symptoms do not get better within 10 days. Get help right away if:  You have a very bad headache.  You cannot stop throwing up (vomiting).  You have pain or swelling around your face or eyes.  You have trouble seeing.  You feel confused.  Your neck is stiff.  You have trouble breathing. This information is not intended to replace advice given to you by your health care provider. Make sure you discuss any questions you have with your health care provider. Document Released: 05/13/2008 Document Revised: 07/21/2016 Document Reviewed: 09/20/2015 Elsevier Interactive Patient Education  [employer].

## 2018-09-02 NOTE — Progress Notes (Signed)
Travis Li is a 37 y.o. male who presents to [place]: [place] today for nasal congestion pressure fatigue and cough.  Symptoms present for about 2 days now.  He has tried some over-the-counter medications which helped a little.  He is worried because in about a week he is traveling to [place] and worried about getting worse or sick on the airplane.  No vomiting diarrhea chest pain or palpitations.  He notes sick contacts at work.  He notes his symptoms are consistent with prior episodes of sinus infections.   ROS as above:  Exam:  BP 131/72   Pulse 67   Temp 97.6 F (36.4 C) (Oral)   Ht 6\' 7"  (2.007 m)   Wt 224 lb (101.6 kg)   SpO2 100%   BMI 25.23 kg/m  Wt Readings from Last 5 Encounters:  09/02/18 224 lb (101.6 kg)  01/30/18 224 lb (101.6 kg)  11/13/17 228 lb (103.4 kg)  10/11/17 215 lb (97.5 kg)  09/05/16 223 lb (101.2 kg)    Gen: Well NAD HEENT: EOMI,  MMM inflamed nasal turbinates bilaterally.  Posterior pharynx with mild cobblestoning.  Normal tympanic membranes bilaterally.  Tender palpation maxillary sinus bilaterally. Lungs: Normal work of breathing. CTABL Heart: RRR no MRG Abd: NABS, Soft. Nondistended, Nontender Exts: Brisk capillary refill, warm and well perfused.     Assessment and Plan: 37 y.o. male with sinusitis likely viral at this point.  Plan for symptomatic management with prednisone Tessalon Perles and Atrovent nasal spray.  Continue over-the-counter medications.  Printed back-up prescription of oral azithromycin antibiotic to use in case patient worsens.  Contact or return to clinic if not improving or worsening prior to trip.  Return sooner if needed.   No orders of the defined types were placed in this encounter.  Meds ordered this encounter  Medications  . predniSONE (DELTASONE) 10 MG tablet    Sig: Take 3 tablets (30 mg total) by  mouth daily with breakfast.    Dispense:  15 tablet    Refill:  0  . benzonatate (TESSALON) 200 MG capsule    Sig: Take 1 capsule (200 mg total) by mouth 3 (three) times daily as needed for cough.    Dispense:  45 capsule    Refill:  0  . ipratropium (ATROVENT) 0.06 % nasal spray    Sig: Place 2 sprays into both nostrils every 4 (four) hours as needed.    Dispense:  10 mL    Refill:  6  . azithromycin (ZITHROMAX) 250 MG tablet    Sig: Take 1 tablet (250 mg total) by mouth daily. Take first 2 tablets together, then 1 every day until finished.    Dispense:  6 tablet    Refill:  0     Historical information moved to improve visibility of documentation.  Past Medical History:  Diagnosis Date  . 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 Mitral valve prolapse 09/05/2016  . PVC's (premature ventricular contractions) 09/05/2016   No past surgical history on file. Social History   Tobacco Use  . Smoking status: Never Smoker  . Smokeless tobacco: Never Used  Substance Use Topics  . Alcohol use: Yes   family history includes Cancer in his mother; Diabetes in his father; Hypercholesterolemia in his father and mother.  Medications: Current Outpatient Medications  Medication Sig Dispense Refill  . fexofenadine (ALLEGRA) 180 MG tablet Take 180 mg by mouth daily.     Marland Kitchen  Multiple Vitamin (MULTIVITAMIN WITH MINERALS) TABS tablet Take 1 tablet by mouth daily.    . Omega-3 Fatty Acids (FISH OIL) 1000 MG CAPS Take 1 capsule by mouth daily.    Marland Kitchen azithromycin (ZITHROMAX) 250 MG tablet Take 1 tablet (250 mg total) by mouth daily. Take first 2 tablets together, then 1 every day until finished. 6 tablet 0  . benzonatate (TESSALON) 200 MG capsule Take 1 capsule (200 mg total) by mouth 3 (three) times daily as needed for cough. 45 capsule 0  . ipratropium (ATROVENT) 0.06 % nasal spray Place 2 sprays into both nostrils every 4 (four) hours as needed. 10 mL 6  . predniSONE (DELTASONE) 10 MG tablet Take 3  tablets (30 mg total) by mouth daily with breakfast. 15 tablet 0   No current facility-administered medications for this visit.    Allergies  Allergen Reactions  . Minocycline Hives    Lips swell     Discussed warning signs or symptoms. Please see discharge instructions. Patient expresses understanding.

## 2019-01-10 DIAGNOSIS — Z791 Long term (current) use of non-steroidal anti-inflammatories (NSAID): Secondary | ICD-10-CM | POA: Diagnosis not present

## 2019-01-10 DIAGNOSIS — Z883 Allergy status to other anti-infective agents status: Secondary | ICD-10-CM | POA: Diagnosis not present

## 2019-01-10 DIAGNOSIS — R079 Chest pain, unspecified: Secondary | ICD-10-CM | POA: Diagnosis not present

## 2019-01-10 DIAGNOSIS — Z79899 Other long term (current) drug therapy: Secondary | ICD-10-CM | POA: Diagnosis not present

## 2019-01-10 DIAGNOSIS — I10 Essential (primary) hypertension: Secondary | ICD-10-CM | POA: Diagnosis not present

## 2019-01-10 DIAGNOSIS — R0789 Other chest pain: Secondary | ICD-10-CM | POA: Diagnosis not present

## 2019-01-10 DIAGNOSIS — I341 Nonrheumatic mitral (valve) prolapse: Secondary | ICD-10-CM | POA: Diagnosis not present

## 2019-01-10 LAB — BASIC METABOLIC PANEL
Creatinine: 1.1 (ref 0.6–1.3)
GLUCOSE: 100
Potassium: 3.5 (ref 3.4–5.3)
Sodium: 143 (ref 137–147)

## 2019-01-10 LAB — HEPATIC FUNCTION PANEL
ALK PHOS: 54 (ref 25–125)
ALT: 22 (ref 10–40)
AST: 18 (ref 14–40)
BILIRUBIN, TOTAL: 0.3

## 2019-01-10 LAB — CBC AND DIFFERENTIAL
HCT: 42 (ref 41–53)
HEMOGLOBIN: 14.7 (ref 13.5–17.5)
PLATELETS: 228 (ref 150–399)

## 2019-01-12 ENCOUNTER — Encounter: Payer: Self-pay | Admitting: Family Medicine

## 2019-01-12 ENCOUNTER — Ambulatory Visit (INDEPENDENT_AMBULATORY_CARE_PROVIDER_SITE_OTHER): Payer: BLUE CROSS/BLUE SHIELD | Admitting: Family Medicine

## 2019-01-12 VITALS — BP 134/81 | HR 67 | Ht 79.0 in | Wt 222.0 lb

## 2019-01-12 DIAGNOSIS — R079 Chest pain, unspecified: Secondary | ICD-10-CM

## 2019-01-12 DIAGNOSIS — I493 Ventricular premature depolarization: Secondary | ICD-10-CM | POA: Diagnosis not present

## 2019-01-12 DIAGNOSIS — I10 Essential (primary) hypertension: Secondary | ICD-10-CM

## 2019-01-12 HISTORY — DX: Essential (primary) hypertension: I10

## 2019-01-12 MED ORDER — OMEPRAZOLE 40 MG PO CPDR: 40.0000 mg | DELAYED_RELEASE_CAPSULE | Freq: Every day | ORAL | 3 refills | Status: DC

## 2019-01-12 MED ORDER — LISINOPRIL 5 MG PO TABS: 5.0000 mg | ORAL_TABLET | Freq: Every day | ORAL | 1 refills | Status: DC

## 2019-01-12 NOTE — Progress Notes (Signed)
Travis Li is a 38 y.o. male who presents to [place]: [place] today for follow-up recent ED visit.  Patient was seen in the emergency department on February 2 for episode of chest pain.  He was hypertensive during the visit, however troponin, d-dimer, chest x-ray and EKG were normal.  He was discharged and advised to follow-up with me in the near future.  He notes that he had traveled internationally recently prior to the episode of chest pain.  He denies any leg swelling.  In the interim he feels well.  He notes a bit of residual chest soreness.  He denies any exertional symptoms.  He denies any pain or dysphasia with swallowing.  No vomiting or diarrhea.  He notes that about 10 years ago he had an episode of chest pain and lesions and had work-up at [facility] cardiology including stress test and Holter monitor that were reportedly normal.  Additionally he notes his blood pressure has been elevated in the past.  He notes a family history of hypertension.  ROS as above:  Exam:  BP 134/81   Pulse 67   Ht 6\' 7"  (2.007 m)   Wt 222 lb (100.7 kg)   BMI 25.01 kg/m  Wt Readings from Last 5 Encounters:  01/12/19 222 lb (100.7 kg)  09/02/18 224 lb (101.6 kg)  01/30/18 224 lb (101.6 kg)  11/13/17 228 lb (103.4 kg)  10/11/17 215 lb (97.5 kg)    Gen: Well NAD HEENT: EOMI,  MMM Lungs: Normal work of breathing. CTABL Heart: RRR no MRG Abd: NABS, Soft. Nondistended, Nontender Exts: Brisk capillary refill, warm and well perfused.   Lab and Radiology Results Results for orders placed or performed in visit on 01/12/19 (from the past 72 hour(s))  CBC and differential     Status: None   Collection Time: 01/10/19 12:00 AM  Result Value Ref Range   Hemoglobin 14.7 13.5 - 17.5   HCT 42 41 - 53   Platelets 228 150 - 270  Basic metabolic panel     Status: None   Collection Time:  01/10/19 12:00 AM  Result Value Ref Range   Glucose 100    Creatinine 1.1 0.6 - 1.3   Potassium 3.5 3.4 - 5.3   Sodium 143 137 - 147  Hepatic function panel     Status: None   Collection Time: 01/10/19 12:00 AM  Result Value Ref Range   Alkaline Phosphatase 54 25 - 125   ALT 22 10 - 40   AST 18 14 - 40   Bilirubin, Total 0.3    No results found.    Assessment and Plan: 38 y.o. male with  Chest pain: Unclear etiology.  Concern for esophagitis versus chest wall pain versus cardiac etiology.  Reasonable to follow back up with cardiology for stress test plan to refer back to [facility] cardiology as he has an existing care relationship.  Plan to start omeprazole and recheck in 1 month.  Hypertension: Blood pressure elevated today.  Based on recent review blood pressure has been persistently elevated.  Plan to start low-dose lisinopril recheck in 1 month.  We will recheck fasting labs at that point.   Orders Placed This Encounter  Procedures  . CBC and differential    This external order was created through the Results Console.  . Basic metabolic panel    This external order was created through the Results Console.  . Hepatic function  panel    This external order was created through the Results Console.  . Ambulatory referral to Cardiology    Referral Priority:   Routine    Referral Type:   Consultation    Referral Reason:   Specialty Services Required    Requested Specialty:   Cardiology    Number of Visits Requested:   1   Meds ordered this encounter  Medications  . omeprazole (PRILOSEC) 40 MG capsule    Sig: Take 1 capsule (40 mg total) by mouth daily.    Dispense:  30 capsule    Refill:  3  . lisinopril (PRINIVIL,ZESTRIL) 5 MG tablet    Sig: Take 1 tablet (5 mg total) by mouth daily.    Dispense:  30 tablet    Refill:  1     Historical information moved to improve visibility of documentation.  Past Medical History:  Diagnosis Date  . Exercise-induced asthma  01/30/2018  . Mitral valve prolapse 09/05/2016  . PVC's (premature ventricular contractions) 09/05/2016   History reviewed. No pertinent surgical history. Social History   Tobacco Use  . Smoking status: Never Smoker  . Smokeless tobacco: Never Used  Substance Use Topics  . Alcohol use: Yes   family history includes Cancer in his mother; Diabetes in his father; Hypercholesterolemia in his father and mother; Hypertension in his mother.  Medications: Current Outpatient Medications  Medication Sig Dispense Refill  . fexofenadine (ALLEGRA) 180 MG tablet Take 180 mg by mouth daily.     . Multiple Vitamin (MULTIVITAMIN WITH MINERALS) TABS tablet Take 1 tablet by mouth daily.    . Omega-3 Fatty Acids (FISH OIL) 1000 MG CAPS Take 1 capsule by mouth daily.    Marland Kitchen lisinopril (PRINIVIL,ZESTRIL) 5 MG tablet Take 1 tablet (5 mg total) by mouth daily. 30 tablet 1  . omeprazole (PRILOSEC) 40 MG capsule Take 1 capsule (40 mg total) by mouth daily. 30 capsule 3   No current facility-administered medications for this visit.    Allergies  Allergen Reactions  . Minocycline Hives    Lips swell     Discussed warning signs or symptoms. Please see discharge instructions. Patient expresses understanding.

## 2019-01-12 NOTE — Patient Instructions (Signed)
Thank you for coming in today. You should hear from Cardiology soon.  Start omeprazole for acid reflux.   Start lisinopril 5mg  daily for blood pressure.   Recheck in 1 month.   Return sooner if needed.   Return fasting at next visit.   Lisinopril tablets What is this medicine? LISINOPRIL (lyse IN oh pril) is an ACE inhibitor. This medicine is used to treat high blood pressure and heart failure. It is also used to protect the heart immediately after a heart attack. This medicine may be used for other purposes; ask your health care provider or pharmacist if you have questions. COMMON BRAND NAME(S): Prinivil, Zestril What should I tell my health care provider before I take this medicine? They need to know if you have any of these conditions: -diabetes -heart or blood vessel disease -kidney disease -low blood pressure -previous swelling of the tongue, face, or lips with difficulty breathing, difficulty swallowing, hoarseness, or tightening of the throat -an unusual or allergic reaction to lisinopril, other ACE inhibitors, insect venom, foods, dyes, or preservatives -pregnant or trying to get pregnant -breast-feeding How should I use this medicine? Take this medicine by mouth with a glass of water. Follow the directions on your prescription label. You may take this medicine with or without food. If it upsets your stomach, take it with food. Take your medicine at regular intervals. Do not take it more often than directed. Do not stop taking except on your doctor's advice. Talk to your pediatrician regarding the use of this medicine in children. Special care may be needed. While this drug may be prescribed for children as young as 88 years of age for selected conditions, precautions do apply. Overdosage: If you think you have taken too much of this medicine contact a poison control center or emergency room at once. NOTE: This medicine is only for you. Do not share this medicine with  others. What if I miss a dose? If you miss a dose, take it as soon as you can. If it is almost time for your next dose, take only that dose. Do not take double or extra doses. What may interact with this medicine? Do not take this medicine with any of the following medications: -hymenoptera venom -sacubitril; valsartan This medicines may also interact with the following medications: -aliskiren -angiotensin receptor blockers, like losartan or valsartan -certain medicines for diabetes -diuretics -everolimus -gold compounds -lithium -NSAIDs, medicines for pain and inflammation, like ibuprofen or naproxen -potassium salts or supplements -salt substitutes -sirolimus -temsirolimus This list may not describe all possible interactions. Give your health care provider a list of all the medicines, herbs, non-prescription drugs, or dietary supplements you use. Also tell them if you smoke, drink alcohol, or use illegal drugs. Some items may interact with your medicine. What should I watch for while using this medicine? Visit your doctor or health care professional for regular check ups. Check your blood pressure as directed. Ask your doctor what your blood pressure should be, and when you should contact him or her. Do not treat yourself for coughs, colds, or pain while you are using this medicine without asking your doctor or health care professional for advice. Some ingredients may increase your blood pressure. Women should inform their doctor if they wish to become pregnant or think they might be pregnant. There is a potential for serious side effects to an unborn child. Talk to your health care professional or pharmacist for more information. Check with your doctor or health care professional  if you get an attack of severe diarrhea, nausea and vomiting, or if you sweat a lot. The loss of too much body fluid can make it dangerous for you to take this medicine. You may get drowsy or dizzy. Do not  drive, use machinery, or do anything that needs mental alertness until you know how this drug affects you. Do not stand or sit up quickly, especially if you are an older patient. This reduces the risk of dizzy or fainting spells. Alcohol can make you more drowsy and dizzy. Avoid alcoholic drinks. Avoid salt substitutes unless you are told otherwise by your doctor or health care professional. What side effects may I notice from receiving this medicine? Side effects that you should report to your doctor or health care professional as soon as possible: -allergic reactions like skin rash, itching or hives, swelling of the hands, feet, face, lips, throat, or tongue -breathing problems -signs and symptoms of kidney injury like trouble passing urine or change in the amount of urine -signs and symptoms of increased potassium like muscle weakness; chest pain; or fast, irregular heartbeat -signs and symptoms of liver injury like dark yellow or brown urine; general ill feeling or flu-like symptoms; light-colored stools; loss of appetite; nausea; right upper belly pain; unusually weak or tired; yellowing of the eyes or skin -signs and symptoms of low blood pressure like dizziness; feeling faint or lightheaded, falls; unusually weak or tired -stomach pain with or without nausea and vomiting Side effects that usually do not require medical attention (report to your doctor or health care professional if they continue or are bothersome): -changes in taste -cough -dizziness -fever -headache -sensitivity to light This list may not describe all possible side effects. Call your doctor for medical advice about side effects. You may report side effects to FDA at 1-800-FDA-1088. Where should I keep my medicine? Keep out of the reach of children. Store at room temperature between 15 and 30 degrees C (59 and 86 degrees F). Protect from moisture. Keep container tightly closed. Throw away any unused medicine after the  expiration date. NOTE: This sheet is a summary. It may not cover all possible information. If you have questions about this medicine, talk to your doctor, pharmacist, or health care provider.  2019 Elsevier/Gold Standard (2016-01-15 12:52:35)

## 2019-01-20 ENCOUNTER — Encounter: Payer: Self-pay | Admitting: Family Medicine

## 2019-01-20 ENCOUNTER — Ambulatory Visit (INDEPENDENT_AMBULATORY_CARE_PROVIDER_SITE_OTHER): Payer: BLUE CROSS/BLUE SHIELD | Admitting: Family Medicine

## 2019-01-20 VITALS — BP 134/62 | HR 79 | Ht 79.0 in | Wt 224.0 lb

## 2019-01-20 DIAGNOSIS — T887XXA Unspecified adverse effect of drug or medicament, initial encounter: Secondary | ICD-10-CM | POA: Diagnosis not present

## 2019-01-20 DIAGNOSIS — I10 Essential (primary) hypertension: Secondary | ICD-10-CM | POA: Diagnosis not present

## 2019-01-20 DIAGNOSIS — R5383 Other fatigue: Secondary | ICD-10-CM

## 2019-01-20 NOTE — Progress Notes (Signed)
Travis Li is a 38 y.o. male who presents to [place]: [place] today for medication side effect.  [name] was prescribed lisinopril for hypertension on February 4.  He developed fatigue mental fogginess and lightheadedness and general whole body unwellness when she started taking lisinopril.  He has been taking 5 mg at bedtime.  He notes his symptoms have continued.  He last took the lisinopril yesterday.  He would like to discontinue the lisinopril if possible.  He denies any runny nose cough congestion fevers or chills shortness of breath.  He was seen on February 4 for an episode of chest pain.  He had work-up in the emergency department that was unremarkable.  He has yet to schedule with cardiology for subsequent stress testing and follow-up.  He notes his chest pain is significantly improved.  He did not check his blood pressure when he felt bad.  He does not have a home blood pressure cuff.  ROS as above:  Exam:  BP 134/62   Pulse 79   Ht 6\' 7"  (2.007 m)   Wt 224 lb (101.6 kg)   BMI 25.23 kg/m  Wt Readings from Last 5 Encounters:  01/20/19 224 lb (101.6 kg)  01/12/19 222 lb (100.7 kg)  09/02/18 224 lb (101.6 kg)  01/30/18 224 lb (101.6 kg)  11/13/17 228 lb (103.4 kg)    Gen: Well NAD HEENT: EOMI,  MMM Lungs: Normal work of breathing. CTABL Heart: RRR no MRG Abd: NABS, Soft. Nondistended, Nontender Exts: Brisk capillary refill, warm and well perfused.   Lab and Radiology Results   Chemistry      Component Value Date/Time   NA 143 01/10/2019   K 3.5 01/10/2019   CL 103 01/16/2018 0809   CO2 29 01/16/2018 0809   BUN 20 01/16/2018 0809   CREATININE 1.1 01/10/2019   CREATININE 1.11 01/16/2018 0809   GLU 100 01/10/2019      Component Value Date/Time   CALCIUM 10.1 01/16/2018 0809   ALKPHOS 54 01/10/2019   AST 18 01/10/2019   ALT 22 01/10/2019   BILITOT 0.8 01/16/2018 0809        Assessment and Plan: 38 y.o. male with fatigue likely secondary to lisinopril.  His symptoms are somewhat nonspecific and not typical for lisinopril.  Regardless he certainly has correlation of symptoms with the lisinopril.  I think it is reasonable to discontinue the lisinopril.  Plan for limited work-up with CBC and metabolic panel.  Will check creatinine and potassium.  Recheck if not improving off of lisinopril.  Additionally recommend home blood pressure log.  Additionally we discussed hypertension management strategies including regular exercise and a low-sodium diet.  We will continue to follow along.  PDMP not reviewed this encounter. Orders Placed This Encounter  Procedures  . COMPLETE METABOLIC PANEL WITH GFR  . CBC with Differential/Platelet   No orders of the defined types were placed in this encounter.    Historical information moved to improve visibility of documentation.  Past Medical History:  Diagnosis Date  . 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 Mitral valve prolapse 09/05/2016  . PVC's (premature ventricular contractions) 09/05/2016   No past surgical history on file. Social History   Tobacco Use  . Smoking status: Never Smoker  . Smokeless tobacco: Never Used  Substance Use Topics  . Alcohol use: Yes   family history includes Cancer in his mother; Diabetes in his father; Hypercholesterolemia in his father and mother;  Hypertension in his mother.  Medications: Current Outpatient Medications  Medication Sig Dispense Refill  . fexofenadine (ALLEGRA) 180 MG tablet Take 180 mg by mouth daily.     . Multiple Vitamin (MULTIVITAMIN WITH MINERALS) TABS tablet Take 1 tablet by mouth daily.    . Omega-3 Fatty Acids (FISH OIL) 1000 MG CAPS Take 1 capsule by mouth daily.    Marland Kitchen omeprazole (PRILOSEC) 40 MG capsule Take 1 capsule (40 mg total) by mouth daily. 30 capsule 3   No current facility-administered medications for this visit.     Allergies  Allergen Reactions  . Minocycline Hives    Lips swell  . Lisinopril Other (See Comments)    Fatigue     Discussed warning signs or symptoms. Please see discharge instructions. Patient expresses understanding.

## 2019-01-20 NOTE — Patient Instructions (Signed)
Thank you for coming in today. Make sure to follow up with cardiology.   STOP lisinopril.   Get labs now.   Let me know if you do not feel better.   Call or go to the emergency room if you get worse, have trouble breathing, have chest pains, or palpitations.    Sent referral with ov notes and insurance to [facility] at [phone] and [phone]. They will call and schedule with patient for good appointment time - CF  Get an automatic blood pressure cuff that goes on the upper arm.  Keep track of blood pressure especially if you feel bad.   Low sodium diet and exercise will help control blood pressure.    DASH Eating Plan DASH stands for "Dietary Approaches to Stop Hypertension." The DASH eating plan is a healthy eating plan that has been shown to reduce high blood pressure (hypertension). It may also reduce your risk for type 2 diabetes, heart disease, and stroke. The DASH eating plan may also help with weight loss. What are tips for following this plan?  General guidelines  Avoid eating more than 2,300 mg (milligrams) of salt (sodium) a day. If you have hypertension, you may need to reduce your sodium intake to 1,500 mg a day.  Limit alcohol intake to no more than 1 drink a day for nonpregnant women and 2 drinks a day for men. One drink equals 12 oz of beer, 5 oz of wine, or 1 oz of hard liquor.  Work with your health care provider to maintain a healthy body weight or to lose weight. Ask what an ideal weight is for you.  Get at least 30 minutes of exercise that causes your heart to beat faster (aerobic exercise) most days of the week. Activities may include walking, swimming, or biking.  Work with your health care provider or diet and nutrition specialist (dietitian) to adjust your eating plan to your individual calorie needs. Reading food labels   Check food labels for the amount of sodium per serving. Choose foods with less than 5 percent  of the Daily Value of sodium. Generally, foods with less than 300 mg of sodium per serving fit into this eating plan.  To find whole grains, look for the word "whole" as the first word in the ingredient list. Shopping  Buy products labeled as "low-sodium" or "no salt added."  Buy fresh foods. Avoid canned foods and premade or frozen meals. Cooking  Avoid adding salt when cooking. Use salt-free seasonings or herbs instead of table salt or sea salt. Check with your health care provider or pharmacist before using salt substitutes.  Do not fry foods. Cook foods using healthy methods such as baking, boiling, grilling, and broiling instead.  Cook with heart-healthy oils, such as olive, canola, soybean, or sunflower oil. Meal planning  Eat a balanced diet that includes: ? 5 or more servings of fruits and vegetables each day. At each meal, try to fill half of your plate with fruits and vegetables. ? Up to 6-8 servings of whole grains each day. ? Less than 6 oz of lean meat, poultry, or fish each day. A 3-oz serving of meat is about the same size as a deck of cards. One egg equals 1 oz. ? 2 servings of low-fat dairy each day. ? A serving of nuts, seeds, or beans 5 times each week. ? Heart-healthy fats. Healthy fats called Omega-3 fatty acids are found in foods such as flaxseeds and coldwater fish,  like sardines, salmon, and mackerel.  Limit how much you eat of the following: ? Canned or prepackaged foods. ? Food that is high in trans fat, such as fried foods. ? Food that is high in saturated fat, such as fatty meat. ? Sweets, desserts, sugary drinks, and other foods with added sugar. ? Full-fat dairy products.  Do not salt foods before eating.  Try to eat at least 2 vegetarian meals each week.  Eat more home-cooked food and less restaurant, buffet, and fast food.  When eating at a restaurant, ask that your food be prepared with less salt or no salt, if possible. What foods are  recommended? The items listed may not be a complete list. Talk with your dietitian about what dietary choices are best for you. Grains Whole-grain or whole-wheat bread. Whole-grain or whole-wheat pasta. Brown rice. [place]row. Bulgur. Whole-grain and low-sodium cereals. Pita bread. Low-fat, low-sodium crackers. Whole-wheat flour tortillas. Vegetables Fresh or frozen vegetables (raw, steamed, roasted, or grilled). Low-sodium or reduced-sodium tomato and vegetable juice. Low-sodium or reduced-sodium tomato sauce and tomato paste. Low-sodium or reduced-sodium canned vegetables. Fruits All fresh, dried, or frozen fruit. Canned fruit in natural juice (without added sugar). Meat and other protein foods Skinless chicken or [place]. Ground chicken or [place]. Pork with fat trimmed off. Fish and seafood. Egg whites. Dried beans, peas, or lentils. Unsalted nuts, nut butters, and seeds. Unsalted canned beans. Lean cuts of beef with fat trimmed off. Low-sodium, lean deli meat. Dairy Low-fat (1%) or fat-free (skim) milk. Fat-free, low-fat, or reduced-fat cheeses. Nonfat, low-sodium ricotta or cottage cheese. Low-fat or nonfat yogurt. Low-fat, low-sodium cheese. Fats and oils Soft margarine without trans fats. Vegetable oil. Low-fat, reduced-fat, or light mayonnaise and salad dressings (reduced-sodium). Canola, safflower, olive, soybean, and sunflower oils. Avocado. Seasoning and other foods Herbs. Spices. Seasoning mixes without salt. Unsalted popcorn and pretzels. Fat-free sweets. What foods are not recommended? The items listed may not be a complete list. Talk with your dietitian about what dietary choices are best for you. Grains Baked goods made with fat, such as croissants, muffins, or some breads. Dry pasta or rice meal packs. Vegetables Creamed or fried vegetables. Vegetables in a cheese sauce. Regular canned vegetables (not low-sodium or reduced-sodium). Regular canned tomato sauce and paste (not  low-sodium or reduced-sodium). Regular tomato and vegetable juice (not low-sodium or reduced-sodium). Angie Fava. Olives. Fruits Canned fruit in a light or heavy syrup. Fried fruit. Fruit in cream or butter sauce. Meat and other protein foods Fatty cuts of meat. Ribs. Fried meat. Berniece Salines. Sausage. Bologna and other processed lunch meats. Salami. Fatback. Hotdogs. Bratwurst. Salted nuts and seeds. Canned beans with added salt. Canned or smoked fish. Whole eggs or egg yolks. Chicken or [place] with skin. Dairy Whole or 2% milk, cream, and half-and-half. Whole or full-fat cream cheese. Whole-fat or sweetened yogurt. Full-fat cheese. Nondairy creamers. Whipped toppings. Processed cheese and cheese spreads. Fats and oils Butter. Stick margarine. Lard. Shortening. Ghee. Bacon fat. Tropical oils, such as coconut, palm kernel, or palm oil. Seasoning and other foods Salted popcorn and pretzels. Onion salt, garlic salt, seasoned salt, table salt, and sea salt. Worcestershire sauce. Tartar sauce. Barbecue sauce. Teriyaki sauce. Soy sauce, including reduced-sodium. Steak sauce. Canned and packaged gravies. Fish sauce. Oyster sauce. Cocktail sauce. Horseradish that you find on the shelf. Ketchup. Mustard. Meat flavorings and tenderizers. Bouillon cubes. Hot sauce and Tabasco sauce. Premade or packaged marinades. Premade or packaged taco seasonings. Relishes. Regular salad dressings. Where to find more information:  National Heart, Lung, and Blood Institute: https://wilson-eaton.com/  American Heart Association: www.heart.org Summary  The DASH eating plan is a healthy eating plan that has been shown to reduce high blood pressure (hypertension). It may also reduce your risk for type 2 diabetes, heart disease, and stroke.  With the DASH eating plan, you should limit salt (sodium) intake to 2,300 mg a day. If you have hypertension, you may need to reduce your sodium intake to 1,500 mg a day.  When on the DASH eating plan,  aim to eat more fresh fruits and vegetables, whole grains, lean proteins, low-fat dairy, and heart-healthy fats.  Work with your health care provider or diet and nutrition specialist (dietitian) to adjust your eating plan to your individual calorie needs. This information is not intended to replace advice given to you by your health care provider. Make sure you discuss any questions you have with your health care provider. Document Released: 11/14/2011 Document Revised: 11/18/2016 Document Reviewed: 11/18/2016 Elsevier Interactive Patient Education  2019 [employer].

## 2019-01-21 LAB — CBC WITH DIFFERENTIAL/PLATELET
Absolute Monocytes: 413 cells/uL (ref 200–950)
Basophils Absolute: 42 cells/uL (ref 0–200)
Basophils Relative: 0.6 %
Eosinophils Absolute: 308 cells/uL (ref 15–500)
Eosinophils Relative: 4.4 %
HEMATOCRIT: 47 % (ref 38.5–50.0)
Hemoglobin: 16.2 g/dL (ref 13.2–17.1)
LYMPHS ABS: 1414 {cells}/uL (ref 850–3900)
MCH: 32 pg (ref 27.0–33.0)
MCHC: 34.5 g/dL (ref 32.0–36.0)
MCV: 92.7 fL (ref 80.0–100.0)
MPV: 10.6 fL (ref 7.5–12.5)
Monocytes Relative: 5.9 %
NEUTROS PCT: 68.9 %
Neutro Abs: 4823 cells/uL (ref 1500–7800)
Platelets: 256 10*3/uL (ref 140–400)
RBC: 5.07 10*6/uL (ref 4.20–5.80)
RDW: 11.8 % (ref 11.0–15.0)
Total Lymphocyte: 20.2 %
WBC: 7 10*3/uL (ref 3.8–10.8)

## 2019-01-21 LAB — COMPLETE METABOLIC PANEL WITH GFR
AG Ratio: 1.9 (calc) (ref 1.0–2.5)
ALT: 26 U/L (ref 9–46)
AST: 16 U/L (ref 10–40)
Albumin: 4.6 g/dL (ref 3.6–5.1)
Alkaline phosphatase (APISO): 49 U/L (ref 36–130)
BUN: 21 mg/dL (ref 7–25)
CALCIUM: 10.2 mg/dL (ref 8.6–10.3)
CHLORIDE: 102 mmol/L (ref 98–110)
CO2: 31 mmol/L (ref 20–32)
CREATININE: 1.15 mg/dL (ref 0.60–1.35)
GFR, Est African American: 93 mL/min/{1.73_m2} (ref >=60)
GFR, Est Non African American: 80 mL/min/{1.73_m2} (ref >=60)
GLUCOSE: 89 mg/dL (ref 65–139)
Globulin: 2.4 g/dL (calc) (ref 1.9–3.7)
POTASSIUM: 4.8 mmol/L (ref 3.5–5.3)
SODIUM: 140 mmol/L (ref 135–146)
TOTAL PROTEIN: 7 g/dL (ref 6.1–8.1)
Total Bilirubin: 0.8 mg/dL (ref 0.2–1.2)

## 2019-02-09 ENCOUNTER — Encounter: Payer: Self-pay | Admitting: Family Medicine

## 2019-02-09 DIAGNOSIS — I493 Ventricular premature depolarization: Secondary | ICD-10-CM

## 2019-02-09 DIAGNOSIS — R079 Chest pain, unspecified: Secondary | ICD-10-CM

## 2019-02-09 DIAGNOSIS — R5383 Other fatigue: Secondary | ICD-10-CM

## 2019-02-10 ENCOUNTER — Ambulatory Visit: Payer: BLUE CROSS/BLUE SHIELD | Admitting: Family Medicine

## 2019-02-21 ENCOUNTER — Encounter: Payer: Self-pay | Admitting: Family Medicine

## 2019-02-22 ENCOUNTER — Ambulatory Visit (INDEPENDENT_AMBULATORY_CARE_PROVIDER_SITE_OTHER): Payer: BLUE CROSS/BLUE SHIELD | Admitting: Family Medicine

## 2019-02-22 ENCOUNTER — Encounter: Payer: Self-pay | Admitting: Family Medicine

## 2019-02-22 ENCOUNTER — Other Ambulatory Visit: Payer: Self-pay

## 2019-02-22 VITALS — BP 140/92 | Temp 98.2°F | Wt 215.0 lb

## 2019-02-22 DIAGNOSIS — J45990 Exercise induced bronchospasm: Secondary | ICD-10-CM

## 2019-02-22 DIAGNOSIS — Z6824 Body mass index (BMI) 24.0-24.9, adult: Secondary | ICD-10-CM | POA: Diagnosis not present

## 2019-02-22 DIAGNOSIS — Z0000 Encounter for general adult medical examination without abnormal findings: Secondary | ICD-10-CM | POA: Diagnosis not present

## 2019-02-22 DIAGNOSIS — I10 Essential (primary) hypertension: Secondary | ICD-10-CM | POA: Diagnosis not present

## 2019-02-22 LAB — LIPID PANEL W/REFLEX DIRECT LDL
Cholesterol: 164 mg/dL (ref <200)
HDL: 43 mg/dL (ref >=40)
LDL Cholesterol (Calc): 104 mg/dL (calc) — ABNORMAL HIGH
Non-HDL Cholesterol (Calc): 121 mg/dL (calc) (ref <130)
Total CHOL/HDL Ratio: 3.8 (calc) (ref <5.0)
Triglycerides: 79 mg/dL (ref <150)

## 2019-02-22 MED ORDER — ALBUTEROL SULFATE HFA 108 (90 BASE) MCG/ACT IN AERS: 2.0000 | INHALATION_SPRAY | Freq: Four times a day (QID) | RESPIRATORY_TRACT | 0 refills | Status: DC | PRN

## 2019-02-22 NOTE — Patient Instructions (Signed)
Thank you for coming in today.  Follow up with Cardiology,  Continue blood pressure log.  Get fasting lab today.  Recheck as needed

## 2019-02-22 NOTE — Progress Notes (Signed)
Travis Li is a 38 y.o. male who presents to [facility] today for well adult visit.  Patient with Hx of HTN, exercise-induced asthma, and migraines is feeling well with no complaints. Patient has been watching his diet and limiting his salt consumption. He has been performing resistance exercises approx 3 days a week. Patient reports a 10 lb weight loss. Patient has a stress test scheduled on 03/01/2019 to follow up on chest pain on 01/10/2019. Patient has not had any recurrent chest pain and evaluation at ER was normal. Patient was somewhat hypertensive in office at 140/92, at home measurements have been normal. Migraines occur quarterly and patient manages them effectively with Tylenol and caffeine.   ROS as above:  Past Medical History:  Diagnosis Date  . 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 Mitral valve prolapse 09/05/2016  . PVC's (premature ventricular contractions) 09/05/2016   No past surgical history on file. Social History   Tobacco Use  . Smoking status: Never Smoker  . Smokeless tobacco: Never Used  Substance Use Topics  . Alcohol use: Yes   family history includes Cancer in his mother; Diabetes in his father; Hypercholesterolemia in his father and mother; Hypertension in his mother.  Medications: Current Outpatient Medications  Medication Sig Dispense Refill  . albuterol (PROVENTIL HFA;VENTOLIN HFA) 108 (90 Base) MCG/ACT inhaler Inhale 2 puffs into the lungs every 6 (six) hours as needed for wheezing or shortness of breath. 1 Inhaler 0  . fexofenadine (ALLEGRA) 180 MG tablet Take 180 mg by mouth daily.     . Multiple Vitamin (MULTIVITAMIN WITH MINERALS) TABS tablet Take 1 tablet by mouth daily.    . Omega-3 Fatty Acids (FISH OIL) 1000 MG CAPS Take 1 capsule by mouth daily.     No current facility-administered medications for this visit.     Allergies  Allergen Reactions  . Minocycline Hives    Lips swell  . Lisinopril Other (See Comments)    Fatigue    Health Maintenance Health Maintenance  Topic Date Due  . INFLUENZA VACCINE  06/03/2019 (Originally 07/09/2018)  . TETANUS/TDAP  07/15/2021  . HIV Screening  Completed     Exam:  BP (!) 140/92   Temp 98.2 F (36.8 C) (Oral)   Wt 215 lb (97.5 kg)   BMI 24.22 kg/m  Wt Readings from Last 5 Encounters:  02/22/19 215 lb (97.5 kg)  01/20/19 224 lb (101.6 kg)  01/12/19 222 lb (100.7 kg)  09/02/18 224 lb (101.6 kg)  01/30/18 224 lb (101.6 kg)      Gen: Well NAD HEENT: EOMI,  MMM Lungs: Normal work of breathing. CTABL Heart: RRR no MRG Abd: NABS, Soft. Nondistended, Nontender Exts: Brisk capillary refill, warm and well perfused.  Psych: Patient reports good affect, denies mood changes and anhedonia.  Depression screen [facility] 2/9 02/22/2019 01/30/2018  Decreased Interest 0 0  Down, Depressed, Hopeless 0 0  PHQ - 2 Score 0 0  Altered sleeping - 0  Tired, decreased energy - 0  Change in appetite - 0  Feeling bad or failure about yourself  - 0  Trouble concentrating - 0  Moving slowly or fidgety/restless - 0  Suicidal thoughts - 0  PHQ-9 Score - 0  Difficult doing work/chores - Not difficult at all       Lab and Radiology Results No results found for this or any previous visit (from the past 72 hour(s)). No results found.  Assessment and Plan: 38 y.o. male with Hx of HTN, exercise-induced asthma, and migraines is here for a screening physical exam. Patient is feeling well with no complaints. Patient has a stress test scheduled on 03/01/2019 to follow up on past episode of chest pain on 01/10/2019. Patient was somewhat hypertensive in office at 140/92, at home measurements have been normal. Plan to follow-up on BP during stress test to assess for need for BP medication, consider HCTZ due to reaction to ACEi. Prescribed Albuterol inhaler refill to use PRN  for exercise-induced asthma.   PDMP not reviewed this encounter. Orders Placed This Encounter  Procedures  . Lipid Panel w/reflex Direct LDL   Meds ordered this encounter  Medications  . albuterol (PROVENTIL HFA;VENTOLIN HFA) 108 (90 Base) MCG/ACT inhaler    Sig: Inhale 2 puffs into the lungs every 6 (six) hours as needed for wheezing or shortness of breath.    Dispense:  1 Inhaler    Refill:  0     Discussed warning signs or symptoms. Please see discharge instructions. Patient expresses understanding.  I personally was present and performed or re-performed the history, physical exam and medical decision-making activities of this service and have verified that the service and findings are accurately documented in the student's note. ___________________________________________ Lynne Leader M.D., ABFM., CAQSM. Primary Care and Sports Medicine Adjunct Instructor of [employer]

## 2019-03-01 ENCOUNTER — Ambulatory Visit (INDEPENDENT_AMBULATORY_CARE_PROVIDER_SITE_OTHER): Payer: BLUE CROSS/BLUE SHIELD | Admitting: Cardiology

## 2019-03-01 ENCOUNTER — Other Ambulatory Visit: Payer: Self-pay

## 2019-03-01 ENCOUNTER — Encounter: Payer: Self-pay | Admitting: Cardiology

## 2019-03-01 VITALS — BP 122/68 | HR 69 | Ht 79.0 in | Wt 215.0 lb

## 2019-03-01 DIAGNOSIS — Z01812 Encounter for preprocedural laboratory examination: Secondary | ICD-10-CM

## 2019-03-01 DIAGNOSIS — I10 Essential (primary) hypertension: Secondary | ICD-10-CM | POA: Diagnosis not present

## 2019-03-01 DIAGNOSIS — R0789 Other chest pain: Secondary | ICD-10-CM

## 2019-03-01 NOTE — Patient Instructions (Addendum)
Medication Instructions:  Your physician recommends that you continue on your current medications as directed. Please refer to the Current Medication list given to you today.  If you need a refill on your cardiac medications before your next appointment, please call your pharmacy.   Lab work: NONE If you have labs (blood work) drawn today and your tests are completely normal, you will receive your results only by: Marland Kitchen MyChart Message (if you have MyChart) OR . A paper copy in the mail If you have any lab test that is abnormal or we need to change your treatment, we will call you to review the results.  Testing/Procedures: An EKG was performed today.  Your physician has requested that you have an echocardiogram. Echocardiography is a painless test that uses sound waves to create images of your heart. It provides your doctor with information about the size and shape of your heart and how well your heart's chambers and valves are working. This procedure takes approximately one hour. There are no restrictions for this procedure.   You have been referred for an exercise tolerance test to be done at our [facility] [place] [place] office. You will be contacted to schedule this appointment.  Follow-Up: At [facility], you and your health needs are our priority.  As part of our continuing mission to provide you with exceptional heart care, we have created designated Provider Care Teams.  These Care Teams include your primary Cardiologist (physician) and Advanced Practice Providers (APPs -  Physician Assistants and Nurse Practitioners) who all work together to provide you with the care you need, when you need it. You will need a follow up appointment in 4 months.    Any Other Special Instructions Will Be Listed Below  Exercise Stress Test An exercise stress test is a test to check how your heart works during exercise. You will need to walk on a treadmill or ride an exercise bike for this test. An  electrocardiogram (ECG) will record your heartbeat when you are at rest and when you are exercising. You may have an ultrasound or nuclear test after the exercise test. The test is done to check for coronary artery disease (CAD). It is also done to:  See how well you can exercise.  Watch for high blood pressure during exercise.  Test how well you can exercise after treatment.  Check the blood flow to your arms and legs. If your test result is not normal, more testing may be needed. What happens before the procedure?  Follow instructions from your doctor about what you cannot eat or drink. ? Do not have any drinks or foods that have caffeine in them for 24 hours before the test, or as told by your doctor. This includes coffee, tea (even decaf tea), sodas, chocolate, and cocoa.  Ask your doctor about changing or stopping your normal medicines. This is important if you: ? Take diabetes medicines. ? Take beta-blocker medicines. ? Wear a nitroglycerin patch.  If you use an inhaler, bring it with you to the test.  Do not put lotions, powders, creams, or oils on your chest before the test.  Wear comfortable shoes and clothing.  Do not use any products that have nicotine or tobacco in them, such as cigarettes and e-cigarettes. Stop using them at least 4 hours before the test. If you need help quitting, ask your doctor. What happens during the procedure?  Patches (electrodes) will be put on your chest.  Wires will be connected to the patches.  The wires will send signals to a machine to record your heartbeat.  Your heart rate will be watched while you are resting and while you are exercising. Your blood pressure will also be watched during the test.  You will walk on a treadmill or use a stationary bike. If you cannot use these, you may be asked to turn a crank with your hands.  The activity will get harder and will raise your heart rate.  You may be asked to breathe into a tube a few  times during the test. This measures the gases that you breathe out.  You will be asked how you are feeling throughout the test.  You will exercise until your heart reaches a target heart rate. You will stop early if: ? You feel dizzy. ? You have chest pain. ? You are out of breath. ? Your blood pressure is too high or too low. ? You have an irregular heartbeat. ? You have pain or aching in your arms or legs. The procedure may vary among doctors and hospitals. What happens after the procedure?  Your blood pressure, heart rate, breathing rate, and blood oxygen level will be watched after the test.  You may return to your normal diet and activities as told by your doctor.  It is up to you to get the results of your test. Ask your doctor, or the department that is doing the test, when your results will be ready. Summary  An exercise stress test is a test to check how your heart works during exercise.  This test is done to check for coronary artery disease.  Your heart rate will be watched while you are resting and while you are exercising.  Follow instructions from your doctor about what you cannot eat or drink before the test. This information is not intended to replace advice given to you by your health care provider. Make sure you discuss any questions you have with your health care provider. Document Released: 05/13/2008 Document Revised: 02/25/2017 Document Reviewed: 02/25/2017 Elsevier Interactive Patient Education  2019 [employer].    Echocardiogram An echocardiogram is a procedure that uses painless sound waves (ultrasound) to produce an image of the heart. Images from an echocardiogram can provide important information about:  Signs of coronary artery disease (CAD).  Aneurysm detection. An aneurysm is a weak or damaged part of an artery wall that bulges out from the normal force of blood pumping through the body.  Heart size and shape. Changes in the size or shape of  the heart can be associated with certain conditions, including heart failure, aneurysm, and CAD.  Heart muscle function.  Heart valve function.  Signs of a past heart attack.  Fluid buildup around the heart.  Thickening of the heart muscle.  A tumor or infectious growth around the heart valves. Tell a health care provider about:  Any allergies you have.  All medicines you are taking, including vitamins, herbs, eye drops, creams, and over-the-counter medicines.  Any blood disorders you have.  Any surgeries you have had.  Any medical conditions you have.  Whether you are pregnant or may be pregnant. What are the risks? Generally, this is a safe procedure. However, problems may occur, including:  Allergic reaction to dye (contrast) that may be used during the procedure. What happens before the procedure? No specific preparation is needed. You may eat and drink normally. What happens during the procedure?   An IV tube may be inserted into one of your  veins.  You may receive contrast through this tube. A contrast is an injection that improves the quality of the pictures from your heart.  A gel will be applied to your chest.  A wand-like tool (transducer) will be moved over your chest. The gel will help to transmit the sound waves from the transducer.  The sound waves will harmlessly bounce off of your heart to allow the heart images to be captured in real-time motion. The images will be recorded on a computer. The procedure may vary among health care providers and hospitals. What happens after the procedure?  You may return to your normal, everyday life, including diet, activities, and medicines, unless your health care provider tells you not to do that. Summary  An echocardiogram is a procedure that uses painless sound waves (ultrasound) to produce an image of the heart.  Images from an echocardiogram can provide important information about the size and shape of your  heart, heart muscle function, heart valve function, and fluid buildup around your heart.  You do not need to do anything to prepare before this procedure. You may eat and drink normally.  After the echocardiogram is completed, you may return to your normal, everyday life, unless your health care provider tells you not to do that. This information is not intended to replace advice given to you by your health care provider. Make sure you discuss any questions you have with your health care provider. Document Released: 11/22/2000 Document Revised: 12/28/2016 Document Reviewed: 12/28/2016 Elsevier Interactive Patient Education  2019 [employer].

## 2019-03-01 NOTE — Progress Notes (Signed)
Cardiology Office Note:    Date:  03/01/2019   ID:  Travis Li, DOB 02-15-1981, MRN [id number]  PCP:  Gregor Hams, MD  Cardiologist:  Jenean Lindau, MD   Referring MD: Gregor Hams, MD    ASSESSMENT:    1. Chest discomfort   2. Essential hypertension    PLAN:    In order of problems listed above:  1. Primary prevention stressed with the patient.  Importance of compliance with diet and medication stressed and he vocalized understanding.  His blood pressure is stable.  Diet was discussed for dyslipidemia.  His last lipids seem to be okay.  He is exercise pattern is good 2. In view of the above symptomatology he will have an exercise stress echo.  Echocardiogram will be done to assess murmur heard on a schedule.  That murmur appears significant.  He did not mention of any murmur history.  In view of the current coronavirus situation we will postpone this for the next few weeks.  He knows to call us if he has any significant symptoms.  He symptoms appear atypical for coronary etiology and he was reassured about it.  He knows to go to the nearest emergency room for any significant symptoms. 3. Patient will be seen in follow-up appointment in 4 months or earlier if the patient has any concerns   Medication Adjustments/Labs and Tests Ordered: Current medicines are reviewed at length with the patient today.  Concerns regarding medicines are outlined above.  Orders Placed This Encounter  Procedures   Exercise Tolerance Test   EKG 12-Lead   ECHOCARDIOGRAM COMPLETE   No orders of the defined types were placed in this encounter.    History of Present Illness:    Travis Li is a 38 y.o. male who is being seen today for the evaluation of chest discomfort at the request of Gregor Hams, MD.  Patient is a pleasant 38 year old male.  He has no significant past medical history.  He tells me that he has history of dyslipidemia.  The patient mentions to me that he had an episode of  chest discomfort when he was watching the Super Bowl.  This went up to the neck.  No orthopnea or PND.  Subsequently he is started exercising on a regular basis aerobic he has no symptoms.  At the time of my evaluation, the patient is alert awake oriented and in no distress.  Past Medical History:  Diagnosis Date   Exercise-induced asthma 01/30/2018   Mitral valve prolapse 09/05/2016   PVC's (premature ventricular contractions) 09/05/2016    History reviewed. No pertinent surgical history.  Current Medications: Current Meds  Medication Sig   albuterol (PROVENTIL HFA;VENTOLIN HFA) 108 (90 Base) MCG/ACT inhaler Inhale 2 puffs into the lungs every 6 (six) hours as needed for wheezing or shortness of breath.   fexofenadine (ALLEGRA) 180 MG tablet Take 180 mg by mouth daily.    Multiple Vitamin (MULTIVITAMIN WITH MINERALS) TABS tablet Take 1 tablet by mouth daily.   Omega-3 Fatty Acids (FISH OIL) 1000 MG CAPS Take 1 capsule by mouth daily.     Allergies:   Minocycline and Lisinopril   Social History   Socioeconomic History   Marital status: Married    Spouse name: Not on file   Number of children: Not on file   Years of education: Not on file   Highest education level: Not on file  Occupational History   Not on file  Social Needs  Financial resource strain: Not on file   Food insecurity:    Worry: Not on file    Inability: Not on file   Transportation needs:    Medical: Not on file    Non-medical: Not on file  Tobacco Use   Smoking status: Never Smoker   Smokeless tobacco: Never Used  Substance and Sexual Activity   Alcohol use: Yes   Drug use: No   Sexual activity: Yes    Partners: Female    Birth control/protection: Condom  Lifestyle   Physical activity:    Days per week: Not on file    Minutes per session: Not on file   Stress: Not on file  Relationships   Social connections:    Talks on phone: Not on file    Gets together: Not on file     Attends religious service: Not on file    Active member of club or organization: Not on file    Attends meetings of clubs or organizations: Not on file    Relationship status: Not on file  Other Topics Concern   Not on file  Social History Narrative   Not on file     Family History: The patient's family history includes Cancer in his mother; Diabetes in his father; Hypercholesterolemia in his father and mother; Hypertension in his mother.  ROS:   Please see the history of present illness.    All other systems reviewed and are negative.  EKGs/Labs/Other Studies Reviewed:    The following studies were reviewed today: EKG reveals sinus rhythm and nonspecific ST-T changes   Recent Labs: 01/20/2019: ALT 26; BUN 21; Creat 1.15; Hemoglobin 16.2; Platelets 256; Potassium 4.8; Sodium 140  Recent Lipid Panel    Component Value Date/Time   CHOL 164 02/22/2019 0807   TRIG 79 02/22/2019 0807   HDL 43 02/22/2019 0807   CHOLHDL 3.8 02/22/2019 0807   LDLCALC 104 (H) 02/22/2019 0807    Physical Exam:    VS:  BP 122/68 (BP Location: Left Arm, Patient Position: Sitting, Cuff Size: Normal)    Pulse 69    Ht 6\' 7"  (2.007 m)    Wt 215 lb (97.5 kg)    SpO2 99%    BMI 24.22 kg/m     Wt Readings from Last 3 Encounters:  03/01/19 215 lb (97.5 kg)  02/22/19 215 lb (97.5 kg)  01/20/19 224 lb (101.6 kg)     GEN: Patient is in no acute distress HEENT: Normal NECK: No JVD; No carotid bruits LYMPHATICS: No lymphadenopathy CARDIAC: S1 S2 regular, 2/6 systolic murmur at the apex. RESPIRATORY:  Clear to auscultation without rales, wheezing or rhonchi  ABDOMEN: Soft, non-tender, non-distended MUSCULOSKELETAL:  No edema; No deformity  SKIN: Warm and dry NEUROLOGIC:  Alert and oriented x 3 PSYCHIATRIC:  Normal affect    Signed, Jenean Lindau, MD  03/01/2019 9:39 AM    [facility]

## 2019-03-11 ENCOUNTER — Other Ambulatory Visit (HOSPITAL_BASED_OUTPATIENT_CLINIC_OR_DEPARTMENT_OTHER): Payer: Self-pay | Admitting: Family Medicine

## 2019-03-11 ENCOUNTER — Ambulatory Visit (INDEPENDENT_AMBULATORY_CARE_PROVIDER_SITE_OTHER): Payer: BLUE CROSS/BLUE SHIELD | Admitting: Family Medicine

## 2019-03-11 ENCOUNTER — Ambulatory Visit (HOSPITAL_BASED_OUTPATIENT_CLINIC_OR_DEPARTMENT_OTHER)
Admission: RE | Admit: 2019-03-11 | Discharge: 2019-03-11 | Disposition: A | Discharge status: Home / Self Care | Payer: BLUE CROSS/BLUE SHIELD | Source: Ambulatory Visit | Attending: Family Medicine | Admitting: Family Medicine

## 2019-03-11 ENCOUNTER — Encounter (HOSPITAL_BASED_OUTPATIENT_CLINIC_OR_DEPARTMENT_OTHER): Payer: Self-pay

## 2019-03-11 ENCOUNTER — Other Ambulatory Visit: Payer: Self-pay

## 2019-03-11 ENCOUNTER — Other Ambulatory Visit: Payer: Self-pay | Admitting: Family Medicine

## 2019-03-11 ENCOUNTER — Encounter: Payer: Self-pay | Admitting: Family Medicine

## 2019-03-11 VITALS — BP 129/77 | HR 72 | Ht 79.0 in | Wt 214.0 lb

## 2019-03-11 DIAGNOSIS — R1084 Generalized abdominal pain: Secondary | ICD-10-CM | POA: Insufficient documentation

## 2019-03-11 DIAGNOSIS — R109 Unspecified abdominal pain: Secondary | ICD-10-CM | POA: Diagnosis not present

## 2019-03-11 DIAGNOSIS — R1032 Left lower quadrant pain: Secondary | ICD-10-CM

## 2019-03-11 DIAGNOSIS — K388 Other specified diseases of appendix: Secondary | ICD-10-CM | POA: Diagnosis not present

## 2019-03-11 LAB — POCT URINALYSIS DIPSTICK
Bilirubin, UA: NEGATIVE
Blood, UA: NEGATIVE
Glucose, UA: NEGATIVE
Ketones, UA: NEGATIVE
Leukocytes, UA: NEGATIVE
Nitrite, UA: NEGATIVE
Protein, UA: NEGATIVE
Spec Grav, UA: 1.02 (ref 1.010–1.025)
Urobilinogen, UA: 0.2 E.U./dL
pH, UA: 7 (ref 5.0–8.0)

## 2019-03-11 MED ORDER — CIPROFLOXACIN HCL 500 MG PO TABS: 500.0000 mg | ORAL_TABLET | Freq: Two times a day (BID) | ORAL | 0 refills | Status: DC

## 2019-03-11 MED ORDER — IOHEXOL 300 MG/ML  SOLN
100.0000 mL | Freq: Once | INTRAMUSCULAR | Status: AC | PRN
  Administered 2019-03-11: 100 mL via INTRAVENOUS

## 2019-03-11 MED ORDER — METRONIDAZOLE 500 MG PO TABS: 500.0000 mg | ORAL_TABLET | Freq: Two times a day (BID) | ORAL | 0 refills | Status: DC

## 2019-03-11 MED FILL — CIPROFLOXACIN HCL 500 MG TA: 500 | 7 days supply | Qty: 14 | Fill #0

## 2019-03-11 MED FILL — metroNIDAZOLE 500 MG TABS: 500 | 7 days supply | Qty: 14 | Fill #0

## 2019-03-11 NOTE — Patient Instructions (Addendum)
Thank you for coming in today. Go to xray downstairs and get the oral contrast and drink it.  Go to [facility] and check into radiology and get medicine filled.  Keep me updated.  I will talk with you tomorrow morning.  If you get acutely worse go to the emergency room  If your belly pain worsens, or you have high fever, bad vomiting, blood in your stool or black tarry stool go to the Emergency Room.   My cell phone is [phone]  Diverticulitis  Diverticulitis is infection or inflammation of small pouches (diverticula) in the colon that form due to a condition called diverticulosis. Diverticula can trap stool (feces) and bacteria, causing infection and inflammation. Diverticulitis may cause severe stomach pain and diarrhea. It may lead to tissue damage in the colon that causes bleeding. The diverticula may also burst (rupture) and cause infected stool to enter other areas of the abdomen. Complications of diverticulitis can include:  Bleeding.  Severe infection.  Severe pain.  Rupture (perforation) of the colon.  Blockage (obstruction) of the colon. What are the causes? This condition is caused by stool becoming trapped in the diverticula, which allows bacteria to grow in the diverticula. This leads to inflammation and infection. What increases the risk? You are more likely to develop this condition if:  You have diverticulosis. The risk for diverticulosis increases if: ? You are overweight or obese. ? You use tobacco products. ? You do not get enough exercise.  You eat a diet that does not include enough fiber. High-fiber foods include fruits, vegetables, beans, nuts, and whole grains. What are the signs or symptoms? Symptoms of this condition may include:  Pain and tenderness in the abdomen. The pain is normally located on the left side of the abdomen, but it may occur in other areas.  Fever and chills.  Bloating.  Cramping.  Nausea.  Vomiting.  Changes  in bowel routines.  Blood in your stool. How is this diagnosed? This condition is diagnosed based on:  Your medical history.  A physical exam.  Tests to make sure there is nothing else causing your condition. These tests may include: ? Blood tests. ? Urine tests. ? Imaging tests of the abdomen, including X-rays, ultrasounds, MRIs, or CT scans. How is this treated? Most cases of this condition are mild and can be treated at home. Treatment may include:  Taking over-the-counter pain medicines.  Following a clear liquid diet.  Taking antibiotic medicines by mouth.  Rest. More severe cases may need to be treated at a hospital. Treatment may include:  Not eating or drinking.  Taking prescription pain medicine.  Receiving antibiotic medicines through an IV tube.  Receiving fluids and nutrition through an IV tube.  Surgery. When your condition is under control, your health care provider may recommend that you have a colonoscopy. This is an exam to look at the entire large intestine. During the exam, a lubricated, bendable tube is inserted into the anus and then passed into the rectum, colon, and other parts of the large intestine. A colonoscopy can show how severe your diverticula are and whether something else may be causing your symptoms. Follow these instructions at home: Medicines  Take over-the-counter and prescription medicines only as told by your health care provider. These include fiber supplements, probiotics, and stool softeners.  If you were prescribed an antibiotic medicine, take it as told by your health care provider. Do not stop taking the antibiotic even if you start to feel better.  Do not drive or use heavy machinery while taking prescription pain medicine. General instructions   Follow a full liquid diet or another diet as directed by your health care provider. After your symptoms improve, your health care provider may tell you to change your diet. He or  she may recommend that you eat a diet that contains at least 25 g (25 grams) of fiber daily. Fiber makes it easier to pass stool. Healthy sources of fiber include: ? Berries. One cup contains 4-8 grams of fiber. ? Beans or lentils. One half cup contains 5-8 grams of fiber. ? Green vegetables. One cup contains 4 grams of fiber.  Exercise for at least 30 minutes, 3 times each week. You should exercise hard enough to raise your heart rate and break a sweat.  Keep all follow-up visits as told by your health care provider. This is important. You may need a colonoscopy. Contact a health care provider if:  Your pain does not improve.  You have a hard time drinking or eating food.  Your bowel movements do not return to normal. Get help right away if:  Your pain gets worse.  Your symptoms do not get better with treatment.  Your symptoms suddenly get worse.  You have a fever.  You vomit more than one time.  You have stools that are bloody, black, or tarry. Summary  Diverticulitis is infection or inflammation of small pouches (diverticula) in the colon that form due to a condition called diverticulosis. Diverticula can trap stool (feces) and bacteria, causing infection and inflammation.  You are at higher risk for this condition if you have diverticulosis and you eat a diet that does not include enough fiber.  Most cases of this condition are mild and can be treated at home. More severe cases may need to be treated at a hospital.  When your condition is under control, your health care provider may recommend that you have an exam called a colonoscopy. This exam can show how severe your diverticula are and whether something else may be causing your symptoms. This information is not intended to replace advice given to you by your health care provider. Make sure you discuss any questions you have with your health care provider. Document Released: 09/04/2005 Document Revised: 12/28/2016  Document Reviewed: 12/28/2016 Elsevier Interactive Patient Education  2019 [employer].

## 2019-03-11 NOTE — Progress Notes (Signed)
Travis Li is a 38 y.o. male who presents to [place]: [place] today for left lower quadrant abdominal pain.  Travis Li was in his normal state of health when he developed mild to moderate left lower quadrant abdominal pain.  The pain became worse today and is become severe.  He notes difficulty walking at times because of the pain.  He denies any fevers chills cough congestion nausea vomiting or diarrhea.  He denies any exposures to people suspected of or known to have COVID-19.  He denies any history of diverticulitis or appendicitis.  He has no abdominal surgical history.  Is not tried much treatment yet for his pain.   ROS as above:  Exam:  BP 129/77   Pulse 72   Ht 6\' 7"  (2.007 m)   Wt 214 lb (97.1 kg)   BMI 24.11 kg/m  Wt Readings from Last 5 Encounters:  03/11/19 214 lb (97.1 kg)  03/01/19 215 lb (97.5 kg)  02/22/19 215 lb (97.5 kg)  01/20/19 224 lb (101.6 kg)  01/12/19 222 lb (100.7 kg)    Gen: Well NAD HEENT: EOMI,  MMM Lungs: Normal work of breathing. CTABL Heart: RRR no MRG Abd: NABS, Soft. Nondistended, tender to palpation left lower quadrant.  Guarding with palpation rebound also present.  Negative psoas sign. Exts: Brisk capillary refill, warm and well perfused.   Lab and Radiology Results Results for orders placed or performed in visit on 03/11/19 (from the past 72 hour(s))  POCT Urinalysis Dipstick     Status: None   Collection Time: 03/11/19  4:08 PM  Result Value Ref Range   Color, UA yellow    Clarity, UA clear    Glucose, UA Negative Negative   Bilirubin, UA negative    Ketones, UA negative    Spec Grav, UA 1.020 1.010 - 1.025   Blood, UA negative    pH, UA 7.0 5.0 - 8.0   Protein, UA Negative Negative   Urobilinogen, UA 0.2 0.2 or 1.0 E.U./dL   Nitrite, UA negative    Leukocytes, UA Negative Negative   Appearance     Odor     No  results found.    Assessment and Plan: 38 y.o. male with left lower quadrant abdominal pain worsening over 1 day.  Highly concerning for diverticulitis or other severe acute abdominal process.  Plan for stat CT scan of the abdomen.  We will go ahead and start empiric treatment now with Cipro and Flagyl and recheck tomorrow morning.  Precautions reviewed.  If worsening next step would be emergency room.  Contact information provided to patient.  PDMP not reviewed this encounter. Orders Placed This Encounter  Procedures  . CT ABDOMEN PELVIS W CONTRAST    Standing Status:   Future    Standing Expiration Date:   06/09/2020    Order Specific Question:   ** REASON FOR EXAM (FREE TEXT)    Answer:   eval left lower quadrent abd pain    Order Specific Question:   If indicated for the ordered procedure, I authorize the administration of contrast media per Radiology protocol    Answer:   Yes    Order Specific Question:   Preferred imaging location?    Answer:   [facility] Specific Question:   Is Oral Contrast requested for this exam?    Answer:   Yes, Per Radiology protocol    Order Specific  Question:   Radiology Contrast Protocol - do NOT remove file path    Answer:   [email or web address]  . POCT Urinalysis Dipstick   Meds ordered this encounter  Medications  . DISCONTD: ciprofloxacin (CIPRO) 500 MG tablet    Sig: Take 1 tablet (500 mg total) by mouth 2 (two) times daily.    Dispense:  14 tablet    Refill:  0  . DISCONTD: metroNIDAZOLE (FLAGYL) 500 MG tablet    Sig: Take 1 tablet (500 mg total) by mouth 2 (two) times daily.    Dispense:  14 tablet    Refill:  0  . ciprofloxacin (CIPRO) 500 MG tablet    Sig: Take 1 tablet (500 mg total) by mouth 2 (two) times daily.    Dispense:  14 tablet    Refill:  0  . metroNIDAZOLE (FLAGYL) 500 MG tablet    Sig: Take 1 tablet (500 mg total) by mouth 2 (two) times daily.    Dispense:  14 tablet    Refill:  0      Historical information moved to improve visibility of documentation.  Past Medical History:  Diagnosis Date  . 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 Mitral valve prolapse 09/05/2016  . PVC's (premature ventricular contractions) 09/05/2016   No past surgical history on file. Social History   Tobacco Use  . Smoking status: Never Smoker  . Smokeless tobacco: Never Used  Substance Use Topics  . Alcohol use: Yes   family history includes Cancer in his mother; Diabetes in his father; Hypercholesterolemia in his father and mother; Hypertension in his mother.  Medications: Current Outpatient Medications  Medication Sig Dispense Refill  . albuterol (PROVENTIL HFA;VENTOLIN HFA) 108 (90 Base) MCG/ACT inhaler Inhale 2 puffs into the lungs every 6 (six) hours as needed for wheezing or shortness of breath. 1 Inhaler 0  . fexofenadine (ALLEGRA) 180 MG tablet Take 180 mg by mouth daily.     . Multiple Vitamin (MULTIVITAMIN WITH MINERALS) TABS tablet Take 1 tablet by mouth daily.    . Omega-3 Fatty Acids (FISH OIL) 1000 MG CAPS Take 1 capsule by mouth daily.    . ciprofloxacin (CIPRO) 500 MG tablet Take 1 tablet (500 mg total) by mouth 2 (two) times daily. 14 tablet 0  . metroNIDAZOLE (FLAGYL) 500 MG tablet Take 1 tablet (500 mg total) by mouth 2 (two) times daily. 14 tablet 0   No current facility-administered medications for this visit.    Allergies  Allergen Reactions  . Minocycline Hives    Lips swell  . Lisinopril Other (See Comments)    Fatigue     Discussed warning signs or symptoms. Please see discharge instructions. Patient expresses understanding.

## 2019-03-12 ENCOUNTER — Other Ambulatory Visit: Payer: BLUE CROSS/BLUE SHIELD

## 2019-06-25 ENCOUNTER — Telehealth: Payer: Self-pay

## 2019-06-25 ENCOUNTER — Ambulatory Visit (HOSPITAL_COMMUNITY): Payer: BC Managed Care – PPO | Attending: Internal Medicine

## 2019-06-25 ENCOUNTER — Other Ambulatory Visit: Payer: Self-pay

## 2019-06-25 DIAGNOSIS — R0789 Other chest pain: Secondary | ICD-10-CM | POA: Insufficient documentation

## 2019-06-25 NOTE — Telephone Encounter (Signed)
CVD19 screening ordered left vm with instruction for patient.

## 2019-06-25 NOTE — Telephone Encounter (Signed)
Left message for patient to call back for echo results 

## 2019-06-25 NOTE — Telephone Encounter (Signed)
-----   Message from Marcine Matar sent at 06/24/2019 11:56 AM EDT ----- Regarding: COVID Test Good Day,   The patient Travis Li is schedule at [facility] for ETT/GXT on 7/31 @ 8am   The patient is aware of COVID test and self isolate before the ETT/GXT can be perform.   Can you please call the patient and order/schedule him for the COVID testing.   Jari Sportsman

## 2019-06-25 NOTE — Telephone Encounter (Signed)
-----   Message from Jenean Lindau, MD sent at 06/25/2019  2:29 PM EDT ----- The results of the study is unremarkable. Please inform patient. I will discuss in detail at next appointment. Cc  primary care/referring physician Jenean Lindau, MD 06/25/2019 2:29 PM

## 2019-06-25 NOTE — Addendum Note (Signed)
Addended by: Beckey Rutter on: 06/25/2019 12:13 PM   Modules accepted: Orders

## 2019-06-29 NOTE — Telephone Encounter (Signed)
Patient informed of results and covid screening protocols. Copy of result sent to Dr. Georgina Snell per Dr. Docia Furl request.

## 2019-07-02 ENCOUNTER — Telehealth: Payer: Self-pay | Admitting: Cardiology

## 2019-07-02 NOTE — Telephone Encounter (Signed)
Virtual Visit Pre-Appointment Phone Call  "(Name), I am calling you today to discuss your upcoming appointment. We are currently trying to limit exposure to the virus that causes COVID-19 by seeing patients at home rather than in the office."  1. "What is the BEST phone number to call the day of the visit?" - include this in appointment notes  2. Do you have or have access to (through a family member/friend) a smartphone with video capability that we can use for your visit?" a. If yes - list this number in appt notes as cell (if different from BEST phone #) and list the appointment type as a VIDEO visit in appointment notes b. If no - list the appointment type as a PHONE visit in appointment notes  Confirm consent - "In the setting of the current Covid19 crisis, you are scheduled for a (phone or video) visit with your provider on (date) at (time).  Just as we do with many in-office visits, in order for you to participate in this visit, we must obtain consent.  If you'd like, I can send this to your mychart (if signed up) or email for you to review.  Otherwise, I can obtain your verbal consent now.  All virtual visits are billed to your insurance company just like a normal visit would be.  By agreeing to a virtual visit, we'd like you to understand that the technology does not allow for your provider to perform an examination, and thus may limit your provider's ability to fully assess your condition. If your provider identifies any concerns that need to be evaluated in person, we will make arrangements to do so.  Finally, though the technology is pretty good, we cannot assure that it will always work on either your or our end, and in the setting of a video visit, we may have to convert it to a phone-only visit.  In either situation, we cannot ensure that we have a secure connection.  Are you willing to proceed?" STAFF: Did the patient verbally acknowledge consent to telehealth visit? Document  YES/NO here: yes 3. Advise patient to be prepared - "Two hours prior to your appointment, go ahead and check your blood pressure, pulse, oxygen saturation, and your weight (if you have the equipment to check those) and write them all down. When your visit starts, your provider will ask you for this information. If you have an Apple Watch or Kardia device, please plan to have heart rate information ready on the day of your appointment. Please have a pen and paper handy nearby the day of the visit as well."  4. Give patient instructions for MyChart download to smartphone OR Doximity/Doxy.me as below if video visit (depending on what platform provider is using)  5. Inform patient they will receive a phone call 15 minutes prior to their appointment time (may be from unknown caller ID) so they should be prepared to answer    TELEPHONE CALL NOTE  Travis Li has been deemed a candidate for a follow-up tele-health visit to limit community exposure during the Covid-19 pandemic. I spoke with the patient via phone to ensure availability of phone/video source, confirm preferred email & phone number, and discuss instructions and expectations.  I reminded Travis Li to be prepared with any vital sign and/or heart rhythm information that could potentially be obtained via home monitoring, at the time of his visit. I reminded Travis Li to expect a phone call prior to his visit.  Travis Li  Judeth Horn 07/02/2019 12:04 PM   INSTRUCTIONS FOR DOWNLOADING THE MYCHART APP TO SMARTPHONE  - The patient must first make sure to have activated MyChart and know their login information - If Apple, go to [employer] and type in MyChart in the search bar and download the app. If Android, ask patient to go to [employer] and type in [place] in the search bar and download the app. The app is free but as with any other app downloads, their phone may require them to verify saved payment information or Apple/Android password.    - The patient will need to then log into the app with their MyChart username and password, and select [facility] as their healthcare provider to link the account. When it is time for your visit, go to the MyChart app, find appointments, and click Begin Video Visit. Be sure to Select Allow for your device to access the Microphone and Camera for your visit. You will then be connected, and your provider will be with you shortly.  **If they have any issues connecting, or need assistance please contact MyChart service desk [phone])**  **If using a computer, in order to ensure the best quality for their visit they will need to use either of the following Internet Browsers: [employer], or Google Chrome**  IF USING DOXIMITY or DOXY.ME - The patient will receive a link just prior to their visit by text.     FULL LENGTH CONSENT FOR TELE-HEALTH VISIT   I hereby voluntarily request, consent and authorize [facility] and its employed or contracted physicians, physician assistants, nurse practitioners or other licensed health care professionals (the Practitioner), to provide me with telemedicine health care services (the Services") as deemed necessary by the treating Practitioner. I acknowledge and consent to receive the Services by the Practitioner via telemedicine. I understand that the telemedicine visit will involve communicating with the Practitioner through live audiovisual communication technology and the disclosure of certain medical information by electronic transmission. I acknowledge that I have been given the opportunity to request an in-person assessment or other available alternative prior to the telemedicine visit and am voluntarily participating in the telemedicine visit.  I understand that I have the right to withhold or withdraw my consent to the use of telemedicine in the course of my care at any time, without affecting my right to future care or treatment, and that  the Practitioner or I may terminate the telemedicine visit at any time. I understand that I have the right to inspect all information obtained and/or recorded in the course of the telemedicine visit and may receive copies of available information for a reasonable fee.  I understand that some of the potential risks of receiving the Services via telemedicine include:   Delay or interruption in medical evaluation due to technological equipment failure or disruption;  Information transmitted may not be sufficient (e.g. poor resolution of images) to allow for appropriate medical decision making by the Practitioner; and/or   In rare instances, security protocols could fail, causing a breach of personal health information.  Furthermore, I acknowledge that it is my responsibility to provide information about my medical history, conditions and care that is complete and accurate to the best of my ability. I acknowledge that Practitioner's advice, recommendations, and/or decision may be based on factors not within their control, such as incomplete or inaccurate data provided by me or distortions of diagnostic images or specimens that may result from electronic transmissions. I understand that the practice  of medicine is not an Chief Strategy Officer and that Practitioner makes no warranties or guarantees regarding treatment outcomes. I acknowledge that I will receive a copy of this consent concurrently upon execution via email to the email address I last provided but may also request a printed copy by calling the office of [facility].    I understand that my insurance will be billed for this visit.   I have read or had this consent read to me.  I understand the contents of this consent, which adequately explains the benefits and risks of the Services being provided via telemedicine.   I have been provided ample opportunity to ask questions regarding this consent and the Services and have had my questions answered to  my satisfaction.  I give my informed consent for the services to be provided through the use of telemedicine in my medical care  By participating in this telemedicine visit I agree to the above.

## 2019-07-05 ENCOUNTER — Other Ambulatory Visit (HOSPITAL_COMMUNITY): Payer: BC Managed Care – PPO

## 2019-07-06 ENCOUNTER — Other Ambulatory Visit (HOSPITAL_COMMUNITY)
Admission: RE | Admit: 2019-07-06 | Discharge: 2019-07-06 | Disposition: A | Discharge status: Home / Self Care | Payer: BC Managed Care – PPO | Source: Ambulatory Visit | Attending: Cardiology | Admitting: Cardiology

## 2019-07-06 DIAGNOSIS — Z20828 Contact with and (suspected) exposure to other viral communicable diseases: Secondary | ICD-10-CM | POA: Insufficient documentation

## 2019-07-06 LAB — SARS CORONAVIRUS 2 (TAT 6-24 HRS): SARS Coronavirus 2: NEGATIVE

## 2019-07-07 ENCOUNTER — Telehealth (HOSPITAL_COMMUNITY): Payer: Self-pay | Admitting: *Deleted

## 2019-07-07 ENCOUNTER — Ambulatory Visit: Payer: BLUE CROSS/BLUE SHIELD | Admitting: Cardiology

## 2019-07-07 NOTE — Telephone Encounter (Signed)
Close encounter 

## 2019-07-09 ENCOUNTER — Ambulatory Visit (HOSPITAL_COMMUNITY)
Admission: RE | Admit: 2019-07-09 | Discharge: 2019-07-09 | Disposition: A | Discharge status: Home / Self Care | Payer: BC Managed Care – PPO | Source: Ambulatory Visit | Attending: Cardiology | Admitting: Cardiology

## 2019-07-09 ENCOUNTER — Other Ambulatory Visit: Payer: Self-pay

## 2019-07-09 DIAGNOSIS — R0789 Other chest pain: Secondary | ICD-10-CM | POA: Diagnosis not present

## 2019-07-09 LAB — EXERCISE TOLERANCE TEST
Estimated workload: 13.7 METS
Exercise duration (min): 12 min
Exercise duration (sec): 12 s
MPHR: 181 {beats}/min
Peak HR: 164 {beats}/min
Percent HR: 90 %
Rest HR: 78 {beats}/min

## 2019-07-13 ENCOUNTER — Telehealth: Payer: Self-pay

## 2019-07-13 NOTE — Telephone Encounter (Signed)
Results for exercise stress test and CVD 19 screening relayed to patient. No further questions. Copy sent to Dr. Georgina Snell per Dr. Docia Furl request.

## 2019-07-13 NOTE — Telephone Encounter (Signed)
-----   Message from Jenean Lindau, MD sent at 07/10/2019 11:56 PM EDT ----- The results of the study is unremarkable. Please inform patient. I will discuss in detail at next appointment. [facility]c  primary care/referring physician Jenean Lindau, MD 07/10/2019 11:56 PM

## 2019-07-20 ENCOUNTER — Encounter: Payer: Self-pay | Admitting: Cardiology

## 2019-07-20 ENCOUNTER — Telehealth (INDEPENDENT_AMBULATORY_CARE_PROVIDER_SITE_OTHER): Payer: BC Managed Care – PPO | Admitting: Cardiology

## 2019-07-20 VITALS — BP 156/84 | HR 70 | Ht 79.0 in | Wt 223.0 lb

## 2019-07-20 DIAGNOSIS — E785 Hyperlipidemia, unspecified: Secondary | ICD-10-CM

## 2019-07-20 DIAGNOSIS — I493 Ventricular premature depolarization: Secondary | ICD-10-CM

## 2019-07-20 DIAGNOSIS — I10 Essential (primary) hypertension: Secondary | ICD-10-CM

## 2019-07-20 NOTE — Progress Notes (Signed)
Virtual Visit via Video Note   This visit type was conducted due to national recommendations for restrictions regarding the COVID-19 Pandemic (e.g. social distancing) in an effort to limit this patient's exposure and mitigate transmission in our community.  Due to his co-morbid illnesses, this patient is at least at moderate risk for complications without adequate follow up.  This format is felt to be most appropriate for this patient at this time.  All issues noted in this document were discussed and addressed.  A limited physical exam was performed with this format.  Please refer to the patient's chart for his consent to telehealth for [facility].   Date:  07/20/2019   ID:  Travis Li, DOB [birth year], MRN [id number]  Patient Location: Home Provider Location: Office  PCP:  Gregor Hams, MD  Cardiologist:  No primary care provider on file.  Electrophysiologist:  None   Evaluation Performed:  Follow-Up Visit  Chief Complaint: Essential hypertension and chest discomfort  History of Present Illness:    Travis Li is a 38 y.o. male with past medical history of elevated blood pressure without a diagnosis of hypertension.  He was evaluated for mitral valve prolapse.  Echocardiogram did not reveal any evidence of mitral valve prolapse.  His blood pressure subsequently was fine but then he mentions to me that he is under significant amount of stress at work and has led a sedentary lifestyle and is been not very compliant with food.  At the time of my evaluation, the patient is alert awake oriented and in no distress.  The patient does not have symptoms concerning for COVID-19 infection (fever, chills, cough, or new shortness of breath).    Past Medical History:  Diagnosis Date  . 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 Mitral valve prolapse 09/05/2016  . PVC's (premature ventricular contractions) 09/05/2016   No past surgical history on file.   Current Meds  Medication Sig  .  albuterol (PROVENTIL HFA;VENTOLIN HFA) 108 (90 Base) MCG/ACT inhaler Inhale 2 puffs into the lungs every 6 (six) hours as needed for wheezing or shortness of breath.  . fexofenadine (ALLEGRA) 180 MG tablet Take 180 mg by mouth daily.   . Multiple Vitamin (MULTIVITAMIN WITH MINERALS) TABS tablet Take 1 tablet by mouth daily.  . Omega-3 Fatty Acids (FISH OIL) 1000 MG CAPS Take 1 capsule by mouth daily.     Allergies:   Minocycline and Lisinopril   Social History   Tobacco Use  . Smoking status: Never Smoker  . Smokeless tobacco: Never Used  Substance Use Topics  . Alcohol use: Yes  . Drug use: No     Family Hx: The patient's family history includes Cancer in his mother; Diabetes in his father; Hypercholesterolemia in his father and mother; Hypertension in his mother.  ROS:   Please see the history of present illness.    None significant All other systems reviewed and are negative.   Prior CV studies:   The following studies were reviewed today:  SUMMARY   LVEF 60-65%, mild LVH, normal wall motion, normal diastolic function, mild mitral leaflet thickening and elongation with end-systolic bowing (no frank prolapse) and trace to mild MR, normal biatrial size, normal IVC, no pericardial effusion  Labs/Other Tests and Data Reviewed:    EKG:  No ECG reviewed.  Recent Labs: 01/20/2019: ALT 26; BUN 21; Creat 1.15; Hemoglobin 16.2; Platelets 256; Potassium 4.8; Sodium 140   Recent Lipid Panel Lab Results  Component Value Date/Time   CHOL 164  02/22/2019 08:07 AM   TRIG 79 02/22/2019 08:07 AM   HDL 43 02/22/2019 08:07 AM   CHOLHDL 3.8 02/22/2019 08:07 AM   LDLCALC 104 (H) 02/22/2019 08:07 AM    Wt Readings from Last 3 Encounters:  07/20/19 223 lb (101.2 kg)  03/11/19 214 lb (97.1 kg)  03/01/19 215 lb (97.5 kg)     Objective:    Vital Signs:  Ht 6\' 7"  (2.007 m)   Wt 223 lb (101.2 kg)   BMI 25.12 kg/m    VITAL SIGNS:  reviewed  ASSESSMENT & PLAN:    1. Elevated  blood pressure without a diagnosis of essential hypertension: I advised the patient about compliance with diet and exercise.  He will keep a track of his blood pressures twice a day and send it in the mail for me.  I will review this and start him on medications as necessary.  Diet was discussed salt intake issues were discussed. 2. Stress test report and blood work from last time was reviewed including lipids and questions were answered to his satisfaction 3. Patient will be seen in follow-up appointment in 6 months or earlier if the patient has any concerns   COVID-19 Education: The signs and symptoms of COVID-19 were discussed with the patient and how to seek care for testing (follow up with PCP or arrange E-visit).  The importance of social distancing was discussed today.  Time:   Today, I have spent 15 minutes with the patient with telehealth technology discussing the above problems.     Medication Adjustments/Labs and Tests Ordered: Current medicines are reviewed at length with the patient today.  Concerns regarding medicines are outlined above.   Tests Ordered: No orders of the defined types were placed in this encounter.   Medication Changes: No orders of the defined types were placed in this encounter.   Follow Up:  Virtual Visit or In Person in 6 month(s)  Signed, Jenean Lindau, MD  07/20/2019 3:37 PM    [place]

## 2019-07-22 NOTE — Patient Instructions (Signed)

## 2019-08-09 DIAGNOSIS — Z20828 Contact with and (suspected) exposure to other viral communicable diseases: Secondary | ICD-10-CM | POA: Diagnosis not present

## 2019-08-27 ENCOUNTER — Emergency Department (INDEPENDENT_AMBULATORY_CARE_PROVIDER_SITE_OTHER)
Admission: EM | Admit: 2019-08-27 | Discharge: 2019-08-27 | Disposition: A | Discharge status: Home / Self Care | Payer: BC Managed Care – PPO | Source: Home / Self Care

## 2019-08-27 ENCOUNTER — Other Ambulatory Visit: Payer: Self-pay

## 2019-08-27 DIAGNOSIS — J019 Acute sinusitis, unspecified: Secondary | ICD-10-CM

## 2019-08-27 DIAGNOSIS — J029 Acute pharyngitis, unspecified: Secondary | ICD-10-CM

## 2019-08-27 DIAGNOSIS — B9689 Other specified bacterial agents as the cause of diseases classified elsewhere: Secondary | ICD-10-CM

## 2019-08-27 DIAGNOSIS — R0981 Nasal congestion: Secondary | ICD-10-CM | POA: Diagnosis not present

## 2019-08-27 DIAGNOSIS — R05 Cough: Secondary | ICD-10-CM

## 2019-08-27 DIAGNOSIS — J069 Acute upper respiratory infection, unspecified: Secondary | ICD-10-CM | POA: Diagnosis not present

## 2019-08-27 LAB — POC SARS CORONAVIRUS 2 AG -  ED: SARS Coronavirus 2 Ag: NEGATIVE

## 2019-08-27 MED ORDER — FLUTICASONE PROPIONATE 50 MCG/ACT NA SUSP: 2.0000 | Freq: Every day | NASAL | 2 refills | Status: DC

## 2019-08-27 MED ORDER — AMOXICILLIN 500 MG PO CAPS: 500.0000 mg | ORAL_CAPSULE | Freq: Three times a day (TID) | ORAL | 0 refills | Status: DC

## 2019-08-27 NOTE — ED Provider Notes (Signed)
[name]    CSN: [id number] Arrival date & time: 08/27/19  1112      History   Chief Complaint Chief Complaint  Patient presents with  . Sinus Issues    HPI Travis Li is a 38 y.o. male.   HPI Travis Li is a 38 y.o. male presenting to UC with c/o 6 days of worsening sinus congestion that has developed into a mildly productive cough and sinus pressure and pain. Mild sore throat. He has used nyquil and Mucinex with mild relief. Denies fever, chills, n/v/d. He notes he did recently return from [place] and has been quarantining in his house separate from the rest of his family and would like to be tested for Covid but believes symptoms are otherwise typical of prior sinus infections.    Past Medical History:  Diagnosis Date  . 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 Mitral valve prolapse 09/05/2016  . PVC's (premature ventricular contractions) 09/05/2016    Patient Active Problem List   Diagnosis Date Noted  . Essential hypertension 01/12/2019  . 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 Dyslipidemia 01/30/2018  . Mitral valve prolapse 09/05/2016  . PVC's (premature ventricular contractions) 09/05/2016  . Environmental allergies 06/22/2012  . History of migraine headaches 06/22/2012    No past surgical history on file.     Home Medications    Prior to Admission medications   Medication Sig Start Date End Date Taking? Authorizing Provider  albuterol (PROVENTIL HFA;VENTOLIN HFA) 108 (90 Base) MCG/ACT inhaler Inhale 2 puffs into the lungs every 6 (six) hours as needed for wheezing or shortness of breath. 02/22/19   Gregor Hams, MD  amoxicillin (AMOXIL) 500 MG capsule Take 1 capsule (500 mg total) by mouth 3 (three) times daily. 08/27/19   Noe Gens, PA-C  fexofenadine (ALLEGRA) 180 MG tablet Take 180 mg by mouth daily.     [provider]  fluticasone (FLONASE) 50 MCG/ACT nasal spray Place 2 sprays into both nostrils daily. 08/27/19   Noe Gens, PA-C  Multiple Vitamin (MULTIVITAMIN WITH MINERALS) TABS tablet Take 1 tablet by mouth daily.    [provider]  Omega-3 Fatty Acids (FISH OIL) 1000 MG CAPS Take 1 capsule by mouth daily.    [provider]    Family History Family History  Problem Relation Age of Onset  . Hypercholesterolemia Mother   . Cancer Mother        Endometrial cancer  . Hypertension Mother   . Diabetes Father   . Hypercholesterolemia Father     Social History Social History   Tobacco Use  . Smoking status: Never Smoker  . Smokeless tobacco: Never Used  Substance Use Topics  . Alcohol use: Yes  . Drug use: No     Allergies   Minocycline and Lisinopril   Review of Systems Review of Systems  Constitutional: Negative for chills and fever.  HENT: Positive for congestion, sinus pressure, sinus pain and sore throat. Negative for ear pain, trouble swallowing and voice change.   Respiratory: Positive for cough. Negative for shortness of breath.   Cardiovascular: Negative for chest pain and palpitations.  Gastrointestinal: Negative for abdominal pain, diarrhea, nausea and vomiting.  Musculoskeletal: Negative for arthralgias, back pain and myalgias.  Skin: Negative for rash.  Neurological: Positive for headaches (frontal). Negative for dizziness and light-headedness.     Physical Exam Triage Vital Signs ED Triage Vitals  Enc Vitals Group     BP 08/27/19 1150 (!) 145/90  Pulse Rate 08/27/19 1150 84     Resp 08/27/19 1150 18     Temp 08/27/19 1150 98.6 F (37 C)     Temp Source 08/27/19 1150 Oral     SpO2 08/27/19 1150 96 %     Weight --      Height 08/27/19 1151 6\' 7"  (2.007 m)     Head Circumference --      Peak Flow --      Pain Score 08/27/19 1151 0     Pain Loc --      Pain Edu? --      Excl. in [employer]? --    No data found.  Updated Vital Signs BP (!) 145/90 (BP Location: Left Arm)   Pulse 84   Temp 98.6 F (37 C) (Oral)   Resp 18   Ht 6\' 7"  (2.007  m)   SpO2 96%   BMI 25.12 kg/m   Visual Acuity Right Eye Distance:   Left Eye Distance:   Bilateral Distance:    Right Eye Near:   Left Eye Near:    Bilateral Near:     Physical Exam Vitals signs and nursing note reviewed.  Constitutional:      Appearance: Normal appearance. He is well-developed.  HENT:     Head: Normocephalic and atraumatic.     Right Ear: Tympanic membrane normal.     Left Ear: Tympanic membrane normal.     Nose: Mucosal edema present.     Right Sinus: Maxillary sinus tenderness and frontal sinus tenderness present.     Left Sinus: Maxillary sinus tenderness and frontal sinus tenderness present.     Mouth/Throat:     Lips: Pink.     Mouth: Mucous membranes are moist.     Pharynx: Oropharynx is clear. Uvula midline.  Neck:     Musculoskeletal: Normal range of motion.  Cardiovascular:     Rate and Rhythm: Normal rate and regular rhythm.  Pulmonary:     Effort: Pulmonary effort is normal. No respiratory distress.     Breath sounds: Normal breath sounds. No stridor. No wheezing, rhonchi or rales.  Musculoskeletal: Normal range of motion.  Skin:    General: Skin is warm and dry.  Neurological:     Mental Status: He is alert and oriented to person, place, and time.  Psychiatric:        Behavior: Behavior normal.      UC Treatments / Results  Labs (all labs ordered are listed, but only abnormal results are displayed) Labs Reviewed  POC SARS CORONAVIRUS 2 ED    EKG   Radiology No results found.  Procedures Procedures (including critical care time)  Medications Ordered in UC Medications - No data to display  Initial Impression / Assessment and Plan / UC Course  I have reviewed the triage vital signs and the nursing notes.  Pertinent labs & imaging results that were available during my care of the patient were reviewed by me and considered in my medical decision making (see chart for details).     Rapid Covid: NEGATIVE Will tx as  bacterial sinusitis AVS provided.  Final Clinical Impressions(s) / UC Diagnoses   Final diagnoses:  Acute upper respiratory infection  Acute bacterial sinusitis     Discharge Instructions      You may take 500mg  acetaminophen every 4-6 hours or in combination with ibuprofen 400-600mg  every 6-8 hours as needed for pain, inflammation, and fever.  Be sure to well hydrated with clear  liquids and get at least 8 hours of sleep at night, preferably more while sick.   Please follow up with family medicine in 1 week if needed.  Please take antibiotics as prescribed and be sure to complete entire course even if you start to feel better to ensure infection does not come back.     ED Prescriptions    Medication Sig Dispense Auth. Provider   amoxicillin (AMOXIL) 500 MG capsule Take 1 capsule (500 mg total) by mouth 3 (three) times daily. 21 capsule Gerarda Fraction, Jermey Closs O, PA-C   fluticasone (FLONASE) 50 MCG/ACT nasal spray Place 2 sprays into both nostrils daily. 16 g Noe Gens, [place]     PDMP not reviewed this encounter.   Noe Gens, [place] 08/28/19 1042

## 2019-08-27 NOTE — Discharge Instructions (Signed)
You may take 500mg acetaminophen every 4-6 hours or in combination with ibuprofen 400-600mg every 6-8 hours as needed for pain, inflammation, and fever. ° °Be sure to well hydrated with clear liquids and get at least 8 hours of sleep at night, preferably more while sick.  ° °Please follow up with family medicine in 1 week if needed. ° °Please take antibiotics as prescribed and be sure to complete entire course even if you start to feel better to ensure infection does not come back. ° °

## 2019-08-27 NOTE — ED Triage Notes (Signed)
Pt c/o sinus congestion since Sunday. Taking mucinex and nyquil prn. Hx of seasonal allergies. Productive cough.

## 2019-10-30 ENCOUNTER — Telehealth: Payer: BC Managed Care – PPO | Admitting: Nurse Practitioner

## 2019-10-30 DIAGNOSIS — J0100 Acute maxillary sinusitis, unspecified: Secondary | ICD-10-CM

## 2019-10-30 MED ORDER — FLUTICASONE PROPIONATE 50 MCG/ACT NA SUSP: 2.0000 | Freq: Every day | NASAL | 6 refills | Status: DC

## 2019-10-30 NOTE — Progress Notes (Signed)
We are sorry that you are not feeling well.  Here is how we plan to help!  Based on what you have shared with me it looks like you have sinusitis.  Sinusitis is inflammation and infection in the sinus cavities of the head.  Based on your presentation I believe you most likely have Acute Viral Sinusitis.This is an infection most likely caused by a virus. There is not specific treatment for viral sinusitis other than to help you with the symptoms until the infection runs its course.  You may use an oral decongestant such as Mucinex D or if you have glaucoma or high blood pressure use plain Mucinex. Saline nasal spray help and can safely be used as often as needed for congestion, I have prescribed: Fluticasone nasal spray two sprays in each nostril once a day : You can go to one of the  testing sites listed below, while they are opened (see hours). You do not need a doctors order to be tested for covid.You do need to self-isolate until your results return and if positive 14 days from when your symptoms started and until you are 3 days symptom free.   Testing Locations (Monday - Friday, 10a.m. - 3:30 p.m.)   [place]: [facility] Entrance), [address] [place], [place]: [address], [place], [place], [place] (entrance off [address] (Closed each Monday): [address] [place], [place] - the short stay covered drive at [facility] (Use the [employer] entrance to [facility] next to [facility].) -    * if you are not improving you may need to be tested for covid. Here is information about testing Some authorities believe that zinc sprays or the use of Echinacea may shorten the course of your symptoms.  Sinus infections are not as easily transmitted as other respiratory infection, however we still recommend that you avoid close contact with loved  ones, especially the very young and elderly.  Remember to wash your hands thoroughly throughout the day as this is the number one way to prevent the spread of infection!  Home Care:  Only take medications as instructed by your medical team.  Do not take these medications with alcohol.  A steam or ultrasonic humidifier can help congestion.  You can place a towel over your head and breathe in the steam from hot water coming from a faucet.  Avoid close contacts especially the very young and the elderly.  Cover your mouth when you cough or sneeze.  Always remember to wash your hands.  Get Help Right Away If:  You develop worsening fever or sinus pain.  You develop a severe head ache or visual changes.  Your symptoms persist after you have completed your treatment plan.  Make sure you  Understand these instructions.  Will watch your condition.  Will get help right away if you are not doing well or get worse.  Your e-visit answers were reviewed by a board certified advanced clinical practitioner to complete your personal care plan.  Depending on the condition, your plan could have included both over the counter or prescription medications.  If there is a problem please reply  once you have received a response from your provider.  Your safety is important to [place].  If you have drug allergies check your prescription carefully.    You can use MyChart to ask  questions about today's visit, request a non-urgent call back, or ask for a work or school excuse for 24 hours related to this e-Visit. If it has been greater than 24 hours you will need to follow up with your provider, or enter a new e-Visit to address those concerns.  You will get an e-mail in the next two days asking about your experience.  I hope that your e-visit has been valuable and will speed your recovery. Thank you for using e-visits.  5-10 minutes spent reviewing and documenting in chart.

## 2020-02-08 ENCOUNTER — Encounter: Payer: Self-pay | Admitting: Cardiology

## 2020-02-08 ENCOUNTER — Ambulatory Visit (INDEPENDENT_AMBULATORY_CARE_PROVIDER_SITE_OTHER): Payer: BC Managed Care – PPO | Admitting: Cardiology

## 2020-02-08 ENCOUNTER — Other Ambulatory Visit: Payer: Self-pay

## 2020-02-08 VITALS — BP 134/90 | HR 79 | Ht 79.0 in | Wt 222.0 lb

## 2020-02-08 DIAGNOSIS — E785 Hyperlipidemia, unspecified: Secondary | ICD-10-CM | POA: Diagnosis not present

## 2020-02-08 DIAGNOSIS — Z1329 Encounter for screening for other suspected endocrine disorder: Secondary | ICD-10-CM

## 2020-02-08 DIAGNOSIS — I341 Nonrheumatic mitral (valve) prolapse: Secondary | ICD-10-CM

## 2020-02-08 DIAGNOSIS — I493 Ventricular premature depolarization: Secondary | ICD-10-CM | POA: Diagnosis not present

## 2020-02-08 DIAGNOSIS — I10 Essential (primary) hypertension: Secondary | ICD-10-CM

## 2020-02-08 NOTE — Patient Instructions (Signed)
Medication Instructions:  No medication changes *If you need a refill on your cardiac medications before your next appointment, please call your pharmacy*   Lab Work: You need to have labs done when you are fasting.  You can come Monday through Friday 8:30 am to 12:00 pm and 1:15 to 4:30. You do not need to make an appointment as the order has already been placed. The labs you are going to have done are BMET, CBC, TSH, LFT and Lipids. If you have labs (blood work) drawn today and your tests are completely normal, you will receive your results only by: Marland Kitchen MyChart Message (if you have MyChart) OR . A paper copy in the mail If you have any lab test that is abnormal or we need to change your treatment, we will call you to review the results.   Testing/Procedures: None ordered   Follow-Up: At [facility], you and your health needs are our priority.  As part of our continuing mission to provide you with exceptional heart care, we have created designated Provider Care Teams.  These Care Teams include your primary Cardiologist (physician) and Advanced Practice Providers (APPs -  Physician Assistants and Nurse Practitioners) who all work together to provide you with the care you need, when you need it.  We recommend signing up for the patient portal called "MyChart".  Sign up information is provided on this After Visit Summary.  MyChart is used to connect with patients for Virtual Visits (Telemedicine).  Patients are able to view lab/test results, encounter notes, upcoming appointments, etc.  Non-urgent messages can be sent to your provider as well.   To learn more about what you can do with MyChart, go to [email or web address].    Your next appointment:   9 month(s)  The format for your next appointment:   In Person  Provider:   Jyl Heinz, MD   Other Instructions NA

## 2020-02-08 NOTE — Progress Notes (Signed)
Cardiology Office Note:    Date:  02/08/2020   ID:  Travis Li, DOB 1981/01/17, MRN [id number]  PCP:  Gregor Hams, MD  Cardiologist:  Jenean Lindau, MD   Referring MD: Gregor Hams, MD    ASSESSMENT:    1. PVC's (premature ventricular contractions)   2. Thyroid disorder screen   3. Dyslipidemia   4. Mitral valve prolapse   5. Essential hypertension    PLAN:    In order of problems listed above:  1. Palpitations: He has had history of PVCs.  These have resolved.  He does not feel palpitations anymore 2. PVCs: Clinically resolution has been obtained I think.  He has no more symptoms of this sort 3. Mitral valve prolapse: This did not become evident on his echo only the mitral valve was bowing.  I discussed this with him and he is assured about it. 4. He will be back in the next few days for fasting blood work including lipids and will be seen in follow-up appointment in 9 months or earlier if he has any concerns.   Medication Adjustments/Labs and Tests Ordered: Current medicines are reviewed at length with the patient today.  Concerns regarding medicines are outlined above.  Orders Placed This Encounter  Procedures  . Basic Metabolic Panel (BMET)  . CBC w/Diff  . TSH  . Hepatic function panel  . Lipid Profile   No orders of the defined types were placed in this encounter.    Chief Complaint  Patient presents with  . Follow-up     History of Present Illness:    Travis Li is a 39 y.o. male.  Patient has past medical history of palpitations and was given the diagnosis of mitral valve prolapse in the remote past.  Echocardiogram revealed mitral valve bowing with no prolapse.  He is doing fine at this time.  He denies any chest pain orthopnea or PND.  At the time of my evaluation, the patient is alert awake oriented and in no distress.  He is exercising on a very regular basis and meticulously.  Past Medical History:  Diagnosis Date  . Exercise-induced  asthma 01/30/2018  . Mitral valve prolapse 09/05/2016  . PVC's (premature ventricular contractions) 09/05/2016    History reviewed. No pertinent surgical history.  Current Medications: Current Meds  Medication Sig  . albuterol (PROVENTIL HFA;VENTOLIN HFA) 108 (90 Base) MCG/ACT inhaler Inhale 2 puffs into the lungs every 6 (six) hours as needed for wheezing or shortness of breath.  . fexofenadine (ALLEGRA) 180 MG tablet Take 180 mg by mouth daily.   . Multiple Vitamin (MULTIVITAMIN WITH MINERALS) TABS tablet Take 1 tablet by mouth daily.  . Omega-3 Fatty Acids (FISH OIL) 1000 MG CAPS Take 1 capsule by mouth daily.     Allergies:   Minocycline and Lisinopril   Social History   Socioeconomic History  . Marital status: Married    Spouse name: Not on file  . Number of children: Not on file  . Years of education: Not on file  . Highest education level: Not on file  Occupational History  . Not on file  Tobacco Use  . Smoking status: Never Smoker  . Smokeless tobacco: Never Used  Substance and Sexual Activity  . Alcohol use: Yes  . Drug use: No  . Sexual activity: Yes    Partners: Female    Birth control/protection: Condom  Other Topics Concern  . Not on file  Social History Narrative  .  Not on file   Social Determinants of Health   Financial Resource Strain:   . Difficulty of Paying Living Expenses: Not on file  Food Insecurity:   . Worried About Charity fundraiser in the Last Year: Not on file  . Ran Out of Food in the Last Year: Not on file  Transportation Needs:   . Lack of Transportation (Medical): Not on file  . Lack of Transportation (Non-Medical): Not on file  Physical Activity:   . Days of Exercise per Week: Not on file  . Minutes of Exercise per Session: Not on file  Stress:   . Feeling of Stress : Not on file  Social Connections:   . Frequency of Communication with Friends and Family: Not on file  . Frequency of Social Gatherings with Friends and Family:  Not on file  . Attends Religious Services: Not on file  . Active Member of Clubs or Organizations: Not on file  . Attends Archivist Meetings: Not on file  . Marital Status: Not on file     Family History: The patient's family history includes Cancer in his mother; Diabetes in his father; Hypercholesterolemia in his father and mother; Hypertension in his mother.  ROS:   Please see the history of present illness.    All other systems reviewed and are negative.  EKGs/Labs/Other Studies Reviewed:    The following studies were reviewed today: I discussed my findings with the patient at extensive length   Recent Labs: No results found for requested labs within last 8760 hours.  Recent Lipid Panel    Component Value Date/Time   CHOL 164 02/22/2019 0807   TRIG 79 02/22/2019 0807   HDL 43 02/22/2019 0807   CHOLHDL 3.8 02/22/2019 0807   LDLCALC 104 (H) 02/22/2019 0807    Physical Exam:    VS:  BP 134/90   Pulse 79   Ht 6\' 7"  (2.007 m)   Wt 222 lb (100.7 kg)   SpO2 96%   BMI 25.01 kg/m     Wt Readings from Last 3 Encounters:  02/08/20 222 lb (100.7 kg)  07/20/19 223 lb (101.2 kg)  03/11/19 214 lb (97.1 kg)     GEN: Patient is in no acute distress HEENT: Normal NECK: No JVD; No carotid bruits LYMPHATICS: No lymphadenopathy CARDIAC: Hear sounds regular, 2/6 systolic murmur at the apex. RESPIRATORY:  Clear to auscultation without rales, wheezing or rhonchi  ABDOMEN: Soft, non-tender, non-distended MUSCULOSKELETAL:  No edema; No deformity  SKIN: Warm and dry NEUROLOGIC:  Alert and oriented x 3 PSYCHIATRIC:  Normal affect   Signed, Jenean Lindau, MD  02/08/2020 1:44 PM    [facility]

## 2020-02-15 DIAGNOSIS — E785 Hyperlipidemia, unspecified: Secondary | ICD-10-CM | POA: Diagnosis not present

## 2020-02-15 DIAGNOSIS — I10 Essential (primary) hypertension: Secondary | ICD-10-CM | POA: Diagnosis not present

## 2020-02-15 DIAGNOSIS — Z1329 Encounter for screening for other suspected endocrine disorder: Secondary | ICD-10-CM | POA: Diagnosis not present

## 2020-02-15 DIAGNOSIS — I341 Nonrheumatic mitral (valve) prolapse: Secondary | ICD-10-CM | POA: Diagnosis not present

## 2020-02-16 ENCOUNTER — Encounter: Payer: Self-pay | Admitting: *Deleted

## 2020-02-16 ENCOUNTER — Telehealth: Payer: Self-pay | Admitting: *Deleted

## 2020-02-16 LAB — BASIC METABOLIC PANEL
BUN/Creatinine Ratio: 16 (ref 9–20)
BUN: 20 mg/dL (ref 6–20)
CO2: 24 mmol/L (ref 20–29)
Calcium: 10.3 mg/dL — ABNORMAL HIGH (ref 8.7–10.2)
Chloride: 100 mmol/L (ref 96–106)
Creatinine, Ser: 1.24 mg/dL (ref 0.76–1.27)
GFR calc Af Amer: 84 mL/min/{1.73_m2} (ref >59)
GFR calc non Af Amer: 73 mL/min/{1.73_m2} (ref >59)
Glucose: 100 mg/dL — ABNORMAL HIGH (ref 65–99)
Potassium: 4.8 mmol/L (ref 3.5–5.2)
Sodium: 145 mmol/L — ABNORMAL HIGH (ref 134–144)

## 2020-02-16 LAB — HEPATIC FUNCTION PANEL
ALT: 31 IU/L (ref 0–44)
AST: 23 IU/L (ref 0–40)
Albumin: 4.9 g/dL (ref 4.0–5.0)
Alkaline Phosphatase: 70 IU/L (ref 39–117)
Bilirubin Total: 0.6 mg/dL (ref 0.0–1.2)
Bilirubin, Direct: 0.13 mg/dL (ref 0.00–0.40)
Total Protein: 7.3 g/dL (ref 6.0–8.5)

## 2020-02-16 LAB — LIPID PANEL
Chol/HDL Ratio: 4.4 ratio (ref 0.0–5.0)
Cholesterol, Total: 189 mg/dL (ref 100–199)
HDL: 43 mg/dL (ref >39)
LDL Chol Calc (NIH): 120 mg/dL — ABNORMAL HIGH (ref 0–99)
Triglycerides: 144 mg/dL (ref 0–149)
VLDL Cholesterol Cal: 26 mg/dL (ref 5–40)

## 2020-02-16 LAB — CBC WITH DIFFERENTIAL/PLATELET
Basophils Absolute: 0 10*3/uL (ref 0.0–0.2)
Basos: 1 %
EOS (ABSOLUTE): 0.2 10*3/uL (ref 0.0–0.4)
Eos: 3 %
Hematocrit: 49.3 % (ref 37.5–51.0)
Hemoglobin: 16.3 g/dL (ref 13.0–17.7)
Immature Grans (Abs): 0 10*3/uL (ref 0.0–0.1)
Immature Granulocytes: 1 %
Lymphocytes Absolute: 1.6 10*3/uL (ref 0.7–3.1)
Lymphs: 29 %
MCH: 31.4 pg (ref 26.6–33.0)
MCHC: 33.1 g/dL (ref 31.5–35.7)
MCV: 95 fL (ref 79–97)
Monocytes Absolute: 0.5 10*3/uL (ref 0.1–0.9)
Monocytes: 9 %
Neutrophils Absolute: 3.2 10*3/uL (ref 1.4–7.0)
Neutrophils: 57 %
Platelets: 231 10*3/uL (ref 150–450)
RBC: 5.19 x10E6/uL (ref 4.14–5.80)
RDW: 11.8 % (ref 11.6–15.4)
WBC: 5.4 10*3/uL (ref 3.4–10.8)

## 2020-02-16 LAB — TSH: TSH: 2.66 u[IU]/mL (ref 0.450–4.500)

## 2020-02-16 NOTE — Telephone Encounter (Signed)
-----   Message from Jenean Lindau, MD sent at 02/16/2020  9:28 AM EST ----- The results of the study is unremarkable. Please inform patient. I will discuss in detail at next appointment. Cc  primary care/referring physician Jenean Lindau, MD 02/16/2020 9:28 AM

## 2020-02-16 NOTE — Telephone Encounter (Signed)
Left message per dpr with lab results and that they were on MY CHART to view. Pt to call with any questions and copy sent to pCP.

## 2020-05-09 ENCOUNTER — Encounter: Payer: Self-pay | Admitting: Family Medicine

## 2020-05-09 ENCOUNTER — Ambulatory Visit (INDEPENDENT_AMBULATORY_CARE_PROVIDER_SITE_OTHER): Payer: BC Managed Care – PPO | Admitting: Family Medicine

## 2020-05-09 DIAGNOSIS — B353 Tinea pedis: Secondary | ICD-10-CM

## 2020-05-09 HISTORY — DX: Tinea pedis: B35.3

## 2020-05-09 MED ORDER — FLUCONAZOLE 150 MG PO TABS: 150.0000 mg | ORAL_TABLET | ORAL | 0 refills | Status: DC

## 2020-05-09 MED ORDER — CEPHALEXIN 500 MG PO CAPS: 500.0000 mg | ORAL_CAPSULE | Freq: Four times a day (QID) | ORAL | 0 refills | Status: AC

## 2020-05-09 NOTE — Patient Instructions (Signed)
Great to meet you today! Start fluconazole once weekly for 3 weeks.  Start cephalexin 500mg  four times per day for 1 week.      Athlete's Foot  Athlete's foot (tinea pedis) is a fungal infection of the skin on your feet. It often occurs on the skin that is between or underneath the toes. It can also occur on the soles of your feet. The infection can spread from person to person (is contagious). It can also spread when a person's bare feet come in contact with the fungus on shower floors or on items such as shoes. What are the causes? This condition is caused by a fungus that grows in warm, moist places. You can get athlete's foot by sharing shoes, shower stalls, towels, and wet floors with someone who is infected. Not washing your feet or changing your socks often enough can also lead to athlete's foot. What increases the risk? This condition is more likely to develop in:  Men.  People who have a weak body defense system (immune system).  People who have diabetes.  People who use public showers, such as at a gym.  People who wear heavy-duty shoes, such as Environmental manager.  Seasons with warm, humid weather. What are the signs or symptoms? Symptoms of this condition include:  Itchy areas between your toes or on the soles of your feet.  White, flaky, or scaly areas between your toes or on the soles of your feet.  Very itchy small blisters between your toes or on the soles of your feet.  Small cuts in your skin. These cuts can become infected.  Thick or discolored toenails. How is this diagnosed? This condition may be diagnosed with a physical exam and a review of your medical history. Your health care provider may also take a skin or toenail sample to examine under a microscope. How is this treated? This condition is treated with antifungal medicines. These may be applied as powders, ointments, or creams. In severe cases, an oral antifungal medicine may be  given. Follow these instructions at home: Medicines  Apply or take over-the-counter and prescription medicines only as told by your health care provider.  Apply your antifungal medicine as told by your health care provider. Do not stop using the antifungal even if your condition improves. Foot care  Do not scratch your feet.  Keep your feet dry: ? Wear cotton or wool socks. Change your socks every day or if they become wet. ? Wear shoes that allow air to flow, such as sandals or canvas tennis shoes.  Wash and dry your feet, including the area between your toes. Also, wash and dry your feet: ? Every day or as told by your health care provider. ? After exercising. General instructions  Do not let others use towels, shoes, nail clippers, or other personal items that touch your feet.  Protect your feet by wearing sandals in wet areas, such as locker rooms and shared showers.  Keep all follow-up visits as told by your health care provider. This is important.  If you have diabetes, keep your blood sugar under control. Contact a health care provider if:  You have a fever.  You have swelling, soreness, warmth, or redness in your foot.  Your feet are not getting better with treatment.  Your symptoms get worse.  You have new symptoms. Summary  Athlete's foot (tinea pedis) is a fungal infection of the skin on your feet. It often occurs on skin that is  between or underneath the toes.  This condition is caused by a fungus that grows in warm, moist places.  Symptoms include white, flaky, or scaly areas between your toes or on the soles of your feet.  This condition is treated with antifungal medicines.  Keep your feet clean. Always dry them thoroughly. This information is not intended to replace advice given to you by your health care provider. Make sure you discuss any questions you have with your health care provider. Document Revised: 11/20/2017 Document Reviewed:  09/15/2017 Elsevier Patient Education  [place].

## 2020-05-09 NOTE — Assessment & Plan Note (Signed)
Recurrent tinea, now with maceration and cracking of webbing not responsive to OTC topical antifungals.  Will treat with fluconazole 150mg  weekly x3 weeks.  Add cephalexin as he appears to also have bacterial infection as well.  Instructed to let me know if not resolving with this.

## 2020-05-09 NOTE — Progress Notes (Signed)
Travis Li - 39 y.o. male MRN [id number]  Date of birth: Jun 21, 1981  Subjective Chief Complaint  Patient presents with  . Rash    HPI Robbert Bendall is a 39 y.o. male here today with complain of rash and pain between toes.  Current symptoms started a couple of weeks ago and if located between 4th and 5th digit of R foot.  He has had some redness with pain.  He also reports intermittent numb/tingly feeling to area.  He has has problems with athletes foot for several years and reports that this comes and goes.  Typically managed with OTC powders or creams, however has not resolved with this treatment this time.  He denies fever, chills, fatigue.   ROS:  A comprehensive ROS was completed and negative except as noted per HPI  Allergies  Allergen Reactions  . Minocycline Hives    Lips swell  . Lisinopril Other (See Comments)    Fatigue    Past Medical History:  Diagnosis Date  . 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 Mitral valve prolapse 09/05/2016  . PVC's (premature ventricular contractions) 09/05/2016    History reviewed. No pertinent surgical history.  Social History   Socioeconomic History  . Marital status: Married    Spouse name: Not on file  . Number of children: Not on file  . Years of education: Not on file  . Highest education level: Not on file  Occupational History  . Not on file  Tobacco Use  . Smoking status: Never Smoker  . Smokeless tobacco: Never Used  Substance and Sexual Activity  . Alcohol use: Yes  . Drug use: No  . Sexual activity: Yes    Partners: Female    Birth control/protection: Condom  Other Topics Concern  . Not on file  Social History Narrative  . Not on file   Social Determinants of Health   Financial Resource Strain:   . Difficulty of Paying Living Expenses:   Food Insecurity:   . Worried About Charity fundraiser in the Last Year:   . Arboriculturist in the Last Year:   Transportation Needs:   . Film/video editor  (Medical):   Marland Kitchen Lack of Transportation (Non-Medical):   Physical Activity:   . Days of Exercise per Week:   . Minutes of Exercise per Session:   Stress:   . Feeling of Stress :   Social Connections:   . Frequency of Communication with Friends and Family:   . Frequency of Social Gatherings with Friends and Family:   . Attends Religious Services:   . Active Member of Clubs or Organizations:   . Attends Archivist Meetings:   Marland Kitchen Marital Status:     Family History  Problem Relation Age of Onset  . Hypercholesterolemia Mother   . Cancer Mother        Endometrial cancer  . Hypertension Mother   . Diabetes Father   . Hypercholesterolemia Father     Health Maintenance  Topic Date Due  . INFLUENZA VACCINE  07/09/2020  . TETANUS/TDAP  07/15/2021  . COVID-19 Vaccine  Completed  . HIV Screening  Completed     ----------------------------------------------------------------------------------------------------------------------------------------------------------------------------------------------------------------- Physical Exam BP 124/82 (BP Location: Left Arm, Patient Position: Sitting, Cuff Size: Normal)   Pulse 68   Ht 6\' 7"  (2.007 m)   Wt 222 lb 9.6 oz (101 kg)   SpO2 100%   BMI 25.08 kg/m   Physical Exam Constitutional:      Appearance:  Normal appearance.  Musculoskeletal:     Cervical back: Neck supple.  Skin:    Comments: Interdigital maceration between 4th and 5th digits of R foot.  There is some cracking in the webbing with surrounding erythema.   Neurological:     General: No focal deficit present.     Mental Status: He is alert.  Psychiatric:        Mood and Affect: Mood normal.        Behavior: Behavior normal.     ------------------------------------------------------------------------------------------------------------------------------------------------------------------------------------------------------------------- Assessment and  Plan  Tinea pedis Recurrent tinea, now with maceration and cracking of webbing not responsive to OTC topical antifungals.  Will treat with fluconazole 150mg  weekly x3 weeks.  Add cephalexin as he appears to also have bacterial infection as well.  Instructed to let me know if not resolving with this.     Meds ordered this encounter  Medications  . fluconazole (DIFLUCAN) 150 MG tablet    Sig: Take 1 tablet (150 mg total) by mouth once a week. Take weekly x3 weeks.    Dispense:  3 tablet    Refill:  0  . cephALEXin (KEFLEX) 500 MG capsule    Sig: Take 1 capsule (500 mg total) by mouth 4 (four) times daily for 7 days.    Dispense:  28 capsule    Refill:  0    No follow-ups on file.    This visit occurred during the SARS-CoV-2 public health emergency.  Safety protocols were in place, including screening questions prior to the visit, additional usage of staff PPE, and extensive cleaning of exam room while observing appropriate contact time as indicated for disinfecting solutions.

## 2020-06-14 ENCOUNTER — Encounter: Payer: Self-pay | Admitting: Family Medicine

## 2020-06-14 ENCOUNTER — Ambulatory Visit (INDEPENDENT_AMBULATORY_CARE_PROVIDER_SITE_OTHER): Payer: BC Managed Care – PPO | Admitting: Family Medicine

## 2020-06-14 DIAGNOSIS — R040 Epistaxis: Secondary | ICD-10-CM

## 2020-06-14 HISTORY — DX: Epistaxis: R04.0

## 2020-06-14 MED ORDER — FLUCONAZOLE 150 MG PO TABS: 150.0000 mg | ORAL_TABLET | ORAL | 0 refills | Status: DC

## 2020-06-14 MED ORDER — MUPIROCIN 2 % EX OINT: 1.0000 "application " | TOPICAL_OINTMENT | Freq: Two times a day (BID) | CUTANEOUS | 0 refills | Status: DC

## 2020-06-14 NOTE — Assessment & Plan Note (Signed)
Recommend taking fluconazole 150mg  once weekly for 4 weeks.   Add mupirocin area and recommend using absorbent antifungal foot powder.

## 2020-06-14 NOTE — Assessment & Plan Note (Signed)
Will have him try mupirocin bid x1 week with nasal saline throughout the day.

## 2020-06-14 NOTE — Patient Instructions (Signed)
Use mupirocin in nose and between toes twice daily for 1 week.   Take fluconazole once weekly for the next 4 weeks (complete current course + additional 3 weeks)  Try zeasorb antifungal foot powder.

## 2020-06-14 NOTE — Progress Notes (Signed)
Travis Li - 39 y.o. male MRN [id number]  Date of birth: September 25, 1981  Subjective Chief Complaint  Patient presents with  . Epistaxis  . Recurrent Skin Infections    HPI Travis Li is a 39 y.o. male here today for follow up of athletes foot.  He reports that it seemed to clearing up until a few days ago when he started to have increased pain and irritation between 4th and 5th digits of R foot.  He has been taking fluconazole but is only taking once per month.  States that this is how the pharmacy labeled this.    He also has had some recurrent nose bleeds.  He has seen ENT in the past and had to have cauterization.  Most recent was this morning but it has stopped at this point.   ROS:  A comprehensive ROS was completed and negative except as noted per HPI  Allergies  Allergen Reactions  . Minocycline Hives    Lips swell  . Lisinopril Other (See Comments)    Fatigue    Past Medical History:  Diagnosis Date  . 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 Mitral valve prolapse 09/05/2016  . PVC's (premature ventricular contractions) 09/05/2016    History reviewed. No pertinent surgical history.  Social History   Socioeconomic History  . Marital status: Married    Spouse name: Not on file  . Number of children: Not on file  . Years of education: Not on file  . Highest education level: Not on file  Occupational History  . Not on file  Tobacco Use  . Smoking status: Never Smoker  . Smokeless tobacco: Never Used  Vaping Use  . Vaping Use: Never used  Substance and Sexual Activity  . Alcohol use: Yes  . Drug use: No  . Sexual activity: Yes    Partners: Female    Birth control/protection: Condom  Other Topics Concern  . Not on file  Social History Narrative  . Not on file   Social Determinants of Health   Financial Resource Strain:   . Difficulty of Paying Living Expenses:   Food Insecurity:   . Worried About Charity fundraiser in the Last Year:   . Arboriculturist  in the Last Year:   Transportation Needs:   . Film/video editor (Medical):   Marland Kitchen Lack of Transportation (Non-Medical):   Physical Activity:   . Days of Exercise per Week:   . Minutes of Exercise per Session:   Stress:   . Feeling of Stress :   Social Connections:   . Frequency of Communication with Friends and Family:   . Frequency of Social Gatherings with Friends and Family:   . Attends Religious Services:   . Active Member of Clubs or Organizations:   . Attends Archivist Meetings:   Marland Kitchen Marital Status:     Family History  Problem Relation Age of Onset  . Hypercholesterolemia Mother   . Cancer Mother        Endometrial cancer  . Hypertension Mother   . Diabetes Father   . Hypercholesterolemia Father     Health Maintenance  Topic Date Due  . Hepatitis C Screening  Never done  . INFLUENZA VACCINE  07/09/2020  . TETANUS/TDAP  07/15/2021  . COVID-19 Vaccine  Completed  . HIV Screening  Completed     ----------------------------------------------------------------------------------------------------------------------------------------------------------------------------------------------------------------- Physical Exam BP 121/76 (BP Location: Left Arm, Patient Position: Sitting, Cuff Size: Normal)   Pulse 74   Temp  97.6 F (36.4 C) (Temporal)   Ht 6' 7.13" (2.01 m)   Wt 226 lb 14.4 oz (102.9 kg)   SpO2 100%   BMI 25.47 kg/m   Physical Exam Constitutional:      Appearance: Normal appearance.  HENT:     Nose:     Comments: Superficial vessels noted along anterior septum.   Cardiovascular:     Rate and Rhythm: Normal rate and regular rhythm.  Musculoskeletal:     Cervical back: Neck supple.     Comments: Interdigital maceration noted between 4th and 5th digit of R foot.  There is surrounding erythema and scaly skin.    Neurological:     Mental Status: He is alert.  Psychiatric:        Mood and Affect: Mood normal.        Behavior: Behavior  normal.     ------------------------------------------------------------------------------------------------------------------------------------------------------------------------------------------------------------------- Assessment and Plan  Tinea pedis Recommend taking fluconazole 150mg  once weekly for 4 weeks.   Add mupirocin area and recommend using absorbent antifungal foot powder.    Epistaxis Will have him try mupirocin bid x1 week with nasal saline throughout the day.    Meds ordered this encounter  Medications  . fluconazole (DIFLUCAN) 150 MG tablet    Sig: Take 1 tablet (150 mg total) by mouth once a week. Take weekly x3 weeks.    Dispense:  3 tablet    Refill:  0  . mupirocin ointment (BACTROBAN) 2 %    Sig: Apply 1 application topically 2 (two) times daily.    Dispense:  22 g    Refill:  0    No follow-ups on file.    This visit occurred during the SARS-CoV-2 public health emergency.  Safety protocols were in place, including screening questions prior to the visit, additional usage of staff PPE, and extensive cleaning of exam room while observing appropriate contact time as indicated for disinfecting solutions.

## 2020-09-12 ENCOUNTER — Encounter: Payer: Self-pay | Admitting: Family Medicine

## 2020-09-12 ENCOUNTER — Other Ambulatory Visit: Payer: Self-pay

## 2020-09-12 ENCOUNTER — Ambulatory Visit (INDEPENDENT_AMBULATORY_CARE_PROVIDER_SITE_OTHER): Payer: BC Managed Care – PPO | Admitting: Family Medicine

## 2020-09-12 DIAGNOSIS — R1032 Left lower quadrant pain: Secondary | ICD-10-CM

## 2020-09-12 DIAGNOSIS — R1030 Lower abdominal pain, unspecified: Secondary | ICD-10-CM | POA: Insufficient documentation

## 2020-09-12 HISTORY — DX: Lower abdominal pain, unspecified: R10.30

## 2020-09-12 MED ORDER — METRONIDAZOLE 500 MG PO TABS: 500.0000 mg | ORAL_TABLET | Freq: Three times a day (TID) | ORAL | 0 refills | Status: DC

## 2020-09-12 MED ORDER — CIPROFLOXACIN HCL 500 MG PO TABS: 500.0000 mg | ORAL_TABLET | Freq: Two times a day (BID) | ORAL | 0 refills | Status: AC

## 2020-09-12 NOTE — Assessment & Plan Note (Signed)
Concern for diverticulitis with worsening of pain since onset and changes to bowel habits.  Will start cipro and flagyl for empiric coverage of diverticulitis.  Urgent CT ordered and checking CMP and CBC today.

## 2020-09-12 NOTE — Patient Instructions (Signed)

## 2020-09-12 NOTE — Progress Notes (Signed)
Travis Li - 39 y.o. male MRN [id number]  Date of birth: 06/16/1981  Subjective Chief Complaint  Patient presents with  . Abdominal Pain    HPI Travis Li is a 39 y.o. male here today with complaint of abdominal pain.  Pain started about a week ago. Initially thought he had pulled a muscle but didn't recall anything that would have caused this.  Started out as mild pain in the LLQ but has been worsening over the past few days.  Pain feels like it is deep within the abdomen.  He denies fever, chills, nausea, vomiting.  He has had increased frequency of bowel movements.  Stools have been soft but not diarrhea.  No blood in stool.   He had abdominal pain last year with CT showing epiploic appendagitis.    ROS:  A comprehensive ROS was completed and negative except as noted per HPI  Allergies  Allergen Reactions  . Minocycline Hives    Lips swell  . Lisinopril Other (See Comments)    Fatigue    Past Medical History:  Diagnosis Date  . 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 Mitral valve prolapse 09/05/2016  . PVC's (premature ventricular contractions) 09/05/2016    History reviewed. No pertinent surgical history.  Social History   Socioeconomic History  . Marital status: Married    Spouse name: Not on file  . Number of children: Not on file  . Years of education: Not on file  . Highest education level: Not on file  Occupational History  . Not on file  Tobacco Use  . Smoking status: Never Smoker  . Smokeless tobacco: Never Used  Vaping Use  . Vaping Use: Never used  Substance and Sexual Activity  . Alcohol use: Yes  . Drug use: No  . Sexual activity: Yes    Partners: Female    Birth control/protection: Condom  Other Topics Concern  . Not on file  Social History Narrative  . Not on file   Social Determinants of Health   Financial Resource Strain:   . Difficulty of Paying Living Expenses: Not on file  Food Insecurity:   . Worried About Charity fundraiser in the  Last Year: Not on file  . Ran Out of Food in the Last Year: Not on file  Transportation Needs:   . Lack of Transportation (Medical): Not on file  . Lack of Transportation (Non-Medical): Not on file  Physical Activity:   . Days of Exercise per Week: Not on file  . Minutes of Exercise per Session: Not on file  Stress:   . Feeling of Stress : Not on file  Social Connections:   . Frequency of Communication with Friends and Family: Not on file  . Frequency of Social Gatherings with Friends and Family: Not on file  . Attends Religious Services: Not on file  . Active Member of Clubs or Organizations: Not on file  . Attends Archivist Meetings: Not on file  . Marital Status: Not on file    Family History  Problem Relation Age of Onset  . Hypercholesterolemia Mother   . Cancer Mother        Endometrial cancer  . Hypertension Mother   . Diabetes Father   . Hypercholesterolemia Father     Health Maintenance  Topic Date Due  . Hepatitis C Screening  Never done  . INFLUENZA VACCINE  03/08/2021 (Originally 07/09/2020)  . TETANUS/TDAP  07/15/2021  . COVID-19 Vaccine  Completed  . HIV Screening  Completed     ----------------------------------------------------------------------------------------------------------------------------------------------------------------------------------------------------------------- Physical Exam BP 122/77 (BP Location: Left Arm, Patient Position: Sitting, Cuff Size: Normal)   Pulse 73   Wt 225 lb 12.8 oz (102.4 kg)   SpO2 100%   BMI 25.35 kg/m   Physical Exam Constitutional:      Appearance: He is well-developed.  HENT:     Head: Normocephalic and atraumatic.  Eyes:     General: No scleral icterus. Cardiovascular:     Rate and Rhythm: Normal rate and regular rhythm.  Abdominal:     General: Abdomen is flat. There is no distension.     Palpations: Abdomen is soft. There is no mass.     Tenderness: There is abdominal tenderness (LLQ  to deep palpation). There is no guarding.     Hernia: No hernia is present.  Neurological:     Mental Status: He is alert.  Psychiatric:        Mood and Affect: Mood normal.        Behavior: Behavior normal.     ------------------------------------------------------------------------------------------------------------------------------------------------------------------------------------------------------------------- Assessment and Plan  Left lower quadrant abdominal pain Concern for diverticulitis with worsening of pain since onset and changes to bowel habits.  Will start cipro and flagyl for empiric coverage of diverticulitis.  Urgent CT ordered and checking CMP and CBC today.     Meds ordered this encounter  Medications  . ciprofloxacin (CIPRO) 500 MG tablet    Sig: Take 1 tablet (500 mg total) by mouth 2 (two) times daily for 7 days.    Dispense:  14 tablet    Refill:  0  . metroNIDAZOLE (FLAGYL) 500 MG tablet    Sig: Take 1 tablet (500 mg total) by mouth 3 (three) times daily.    Dispense:  21 tablet    Refill:  0   Orders Placed This Encounter  Procedures  . CT Abdomen Pelvis W Contrast    Order Specific Question:   If indicated for the ordered procedure, I authorize the administration of contrast media per Radiology protocol    Answer:   Yes    Order Specific Question:   Preferred imaging location?    Answer:   [facility]    Order Specific Question:   Is Oral Contrast requested for this exam?    Answer:   Yes, Per Radiology protocol  . COMPLETE METABOLIC PANEL WITH GFR  . CBC with Differential    No follow-ups on file.    This visit occurred during the SARS-CoV-2 public health emergency.  Safety protocols were in place, including screening questions prior to the visit, additional usage of staff PPE, and extensive cleaning of exam room while observing appropriate contact time as indicated for disinfecting solutions.

## 2020-09-13 ENCOUNTER — Ambulatory Visit (INDEPENDENT_AMBULATORY_CARE_PROVIDER_SITE_OTHER): Payer: BC Managed Care – PPO

## 2020-09-13 DIAGNOSIS — K760 Fatty (change of) liver, not elsewhere classified: Secondary | ICD-10-CM | POA: Diagnosis not present

## 2020-09-13 DIAGNOSIS — R1030 Lower abdominal pain, unspecified: Secondary | ICD-10-CM | POA: Diagnosis not present

## 2020-09-13 DIAGNOSIS — R109 Unspecified abdominal pain: Secondary | ICD-10-CM | POA: Diagnosis not present

## 2020-09-13 LAB — COMPLETE METABOLIC PANEL WITH GFR
AG Ratio: 2.1 (calc) (ref 1.0–2.5)
ALT: 21 U/L (ref 9–46)
AST: 17 U/L (ref 10–40)
Albumin: 4.6 g/dL (ref 3.6–5.1)
Alkaline phosphatase (APISO): 48 U/L (ref 36–130)
BUN: 22 mg/dL (ref 7–25)
CO2: 29 mmol/L (ref 20–32)
Calcium: 9.8 mg/dL (ref 8.6–10.3)
Chloride: 103 mmol/L (ref 98–110)
Creat: 1.35 mg/dL (ref 0.60–1.35)
GFR, Est African American: 76 mL/min/{1.73_m2} (ref >=60)
GFR, Est Non African American: 65 mL/min/{1.73_m2} (ref >=60)
Globulin: 2.2 g/dL (calc) (ref 1.9–3.7)
Glucose, Bld: 91 mg/dL (ref 65–99)
Potassium: 4.1 mmol/L (ref 3.5–5.3)
Sodium: 139 mmol/L (ref 135–146)
Total Bilirubin: 0.5 mg/dL (ref 0.2–1.2)
Total Protein: 6.8 g/dL (ref 6.1–8.1)

## 2020-09-13 LAB — CBC WITH DIFFERENTIAL/PLATELET
Absolute Monocytes: 496 cells/uL (ref 200–950)
Basophils Absolute: 41 cells/uL (ref 0–200)
Basophils Relative: 0.6 %
Eosinophils Absolute: 211 cells/uL (ref 15–500)
Eosinophils Relative: 3.1 %
HCT: 44.6 % (ref 38.5–50.0)
Hemoglobin: 15.3 g/dL (ref 13.2–17.1)
Lymphs Abs: 2054 cells/uL (ref 850–3900)
MCH: 32.3 pg (ref 27.0–33.0)
MCHC: 34.3 g/dL (ref 32.0–36.0)
MCV: 94.3 fL (ref 80.0–100.0)
MPV: 10.7 fL (ref 7.5–12.5)
Monocytes Relative: 7.3 %
Neutro Abs: 3998 cells/uL (ref 1500–7800)
Neutrophils Relative %: 58.8 %
Platelets: 265 10*3/uL (ref 140–400)
RBC: 4.73 10*6/uL (ref 4.20–5.80)
RDW: 11.8 % (ref 11.0–15.0)
Total Lymphocyte: 30.2 %
WBC: 6.8 10*3/uL (ref 3.8–10.8)

## 2020-09-13 MED ORDER — IOHEXOL 300 MG/ML  SOLN
100.0000 mL | Freq: Once | INTRAMUSCULAR | Status: AC | PRN
  Administered 2020-09-13: 100 mL via INTRAVENOUS

## 2020-09-25 ENCOUNTER — Encounter: Payer: Self-pay | Admitting: Medical-Surgical

## 2020-09-25 ENCOUNTER — Ambulatory Visit (INDEPENDENT_AMBULATORY_CARE_PROVIDER_SITE_OTHER): Payer: BC Managed Care – PPO | Admitting: Medical-Surgical

## 2020-09-25 ENCOUNTER — Other Ambulatory Visit: Payer: Self-pay

## 2020-09-25 VITALS — BP 125/82 | HR 62 | Temp 97.8°F | Ht 79.0 in | Wt 227.2 lb

## 2020-09-25 DIAGNOSIS — R1032 Left lower quadrant pain: Secondary | ICD-10-CM | POA: Diagnosis not present

## 2020-09-25 LAB — POCT URINALYSIS DIP (CLINITEK)
Bilirubin, UA: NEGATIVE
Blood, UA: NEGATIVE
Glucose, UA: NEGATIVE mg/dL
Ketones, POC UA: NEGATIVE mg/dL
Leukocytes, UA: NEGATIVE
Nitrite, UA: NEGATIVE
POC PROTEIN,UA: NEGATIVE
Spec Grav, UA: 1.015 (ref 1.010–1.025)
Urobilinogen, UA: 0.2 E.U./dL
pH, UA: 7.5 (ref 5.0–8.0)

## 2020-09-25 MED ORDER — CIPROFLOXACIN HCL 500 MG PO TABS: 500.0000 mg | ORAL_TABLET | Freq: Two times a day (BID) | ORAL | 0 refills | Status: DC

## 2020-09-25 NOTE — Progress Notes (Signed)
Subjective:    CC: Left lower quadrant pain  HPI: Pleasant 39 year old male presenting today for follow-up of left lower quadrant pain.  He was seen by Dr. Zigmund Daniel on 10/5 with reports of LLQ discomfort.  He was originally prescribed Cipro twice daily x7 days and Flagyl 3 times daily x7 days to cover empirically for diverticulitis.  His blood work was normal.  He had an abdominal CT completed that showed no signs of diverticular disease but did show a thickening of the bladder wall indicating possible cystitis.  He was told to stop the Flagyl but continue the full course of Cipro to treat a UTI.  He has completed the 7 days of Cipro and notes that his symptoms disappeared while taking the medication.  Notes that after his last dose of Cipro he was completely pain-free with resolution of symptoms.  Approximately 36 hours later he noted the pain in the left lower quadrant was returning.  His pain is continued to worsen each day until it is started to affect his activities.  Notes the pain starts about 2 inches over to the left from his umbilicus and is described as tight which increases to a dull pain.  When he rests for a while, he has no pain but with any activity it starts again.  Currently rates his discomfort at 3-4/10.  Notes he has been increasingly fatigued but denies fever, chills, nausea, and vomiting.  Normally has daily regular bowel movements but notes they are smaller and more frequent now with 3-4 episodes daily, looser than usual.  He has not taken anything at home for treatment of his symptoms.  I reviewed the past medical history, family history, social history, surgical history, and allergies today and no changes were needed.  Please see the problem list section below in epic for further details.  Past Medical History: Past Medical History:  Diagnosis Date  . 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 Mitral valve prolapse 09/05/2016  . PVC's (premature ventricular contractions) 09/05/2016    Past Surgical History: History reviewed. No pertinent surgical history. Social History: Social History   Socioeconomic History  . Marital status: Married    Spouse name: Not on file  . Number of children: Not on file  . Years of education: Not on file  . Highest education level: Not on file  Occupational History  . Not on file  Tobacco Use  . Smoking status: Never Smoker  . Smokeless tobacco: Never Used  Vaping Use  . Vaping Use: Never used  Substance and Sexual Activity  . Alcohol use: Yes  . Drug use: No  . Sexual activity: Yes    Partners: Female    Birth control/protection: Condom  Other Topics Concern  . Not on file  Social History Narrative  . Not on file   Social Determinants of Health   Financial Resource Strain:   . Difficulty of Paying Living Expenses: Not on file  Food Insecurity:   . Worried About Charity fundraiser in the Last Year: Not on file  . Ran Out of Food in the Last Year: Not on file  Transportation Needs:   . Lack of Transportation (Medical): Not on file  . Lack of Transportation (Non-Medical): Not on file  Physical Activity:   . Days of Exercise per Week: Not on file  . Minutes of Exercise per Session: Not on file  Stress:   . Feeling of Stress : Not on file  Social Connections:   . Frequency of  Communication with Friends and Family: Not on file  . Frequency of Social Gatherings with Friends and Family: Not on file  . Attends Religious Services: Not on file  . Active Member of Clubs or Organizations: Not on file  . Attends Archivist Meetings: Not on file  . Marital Status: Not on file   Family History: Family History  Problem Relation Age of Onset  . Hypercholesterolemia Mother   . Cancer Mother        Endometrial cancer  . Hypertension Mother   . Diabetes Father   . Hypercholesterolemia Father    Allergies: Allergies  Allergen Reactions  . Minocycline Hives    Lips swell  . Lisinopril Other (See Comments)     Fatigue   Medications: See med rec.  Review of Systems: See HPI for pertinent positives and negatives.   Objective:    General: Well Developed, well nourished, and in no acute distress.  Neuro: Alert and oriented x3.  HEENT: Normocephalic, atraumatic.  Skin: Warm and dry. Cardiac: Regular rate and rhythm, no murmurs rubs or gallops, no lower extremity edema.  Respiratory: Clear to auscultation bilaterally. Not using accessory muscles, speaking in full sentences. Abdomen: Soft, nontender to RUQ/LUQ, mildly tender to RLQ with rebound tenderness on the left, moderately tender to LLQ, nondistended. Bowel sounds + x 4 quadrants. No HSM appreciated.  Impression and Recommendations:    1. Left lower quadrant pain POCT UA negative.  Unclear etiology given response to Cipro but return of symptoms after completion.  Restarting Cipro twice daily x5 days.  Ordering stool studies to include stool culture and C. difficile.  Advised patient to contact the office if symptoms do not respond to Cipro in the next 48 hours, may consider adding Flagyl back in at that time. - POCT URINALYSIS DIP (CLINITEK) - Stool Culture - C. difficile GDH and Toxin A/B  Return if symptoms worsen or fail to improve. ___________________________________________ Clearnce Sorrel, DNP, APRN, FNP-BC [facility] [place]

## 2020-09-26 DIAGNOSIS — R1032 Left lower quadrant pain: Secondary | ICD-10-CM | POA: Diagnosis not present

## 2020-09-27 ENCOUNTER — Encounter: Payer: Self-pay | Admitting: Family Medicine

## 2020-09-27 ENCOUNTER — Telehealth: Payer: Self-pay | Admitting: Family Medicine

## 2020-09-27 NOTE — Telephone Encounter (Signed)
Pt called. Left vm this morning about his abdominal pain getting worse.  He was told to call if he was no better.  Thank you.

## 2020-09-27 NOTE — Telephone Encounter (Signed)
Appt has been made for tomorrow 09/28/20

## 2020-09-27 NOTE — Telephone Encounter (Signed)
Please ask if he can come in tomorrow if this is worsening.

## 2020-09-28 ENCOUNTER — Other Ambulatory Visit: Payer: Self-pay

## 2020-09-28 ENCOUNTER — Ambulatory Visit (INDEPENDENT_AMBULATORY_CARE_PROVIDER_SITE_OTHER): Payer: BC Managed Care – PPO | Admitting: Family Medicine

## 2020-09-28 ENCOUNTER — Encounter: Payer: Self-pay | Admitting: Family Medicine

## 2020-09-28 VITALS — BP 129/83 | HR 81 | Ht 79.0 in | Wt 227.0 lb

## 2020-09-28 DIAGNOSIS — R1030 Lower abdominal pain, unspecified: Secondary | ICD-10-CM | POA: Diagnosis not present

## 2020-09-28 LAB — C. DIFFICILE GDH AND TOXIN A/B
GDH ANTIGEN: NOT DETECTED
MICRO NUMBER:: 11090816
SPECIMEN QUALITY:: ADEQUATE
TOXIN A AND B: NOT DETECTED

## 2020-09-28 LAB — SALMONELLA/SHIGELLA CULT, CAMPY EIA AND SHIGA TOXIN RFL ECOLI
MICRO NUMBER: 11090794
MICRO NUMBER:: 11090795
MICRO NUMBER:: 11090796
Result:: NOT DETECTED
SHIGA RESULT:: NOT DETECTED
SPECIMEN QUALITY: ADEQUATE
SPECIMEN QUALITY:: ADEQUATE
SPECIMEN QUALITY:: ADEQUATE

## 2020-09-28 MED ORDER — CYCLOBENZAPRINE HCL 10 MG PO TABS: 10.0000 mg | ORAL_TABLET | Freq: Three times a day (TID) | ORAL | 0 refills | Status: DC | PRN

## 2020-09-28 MED ORDER — TRAMADOL HCL 50 MG PO TABS: 50.0000 mg | ORAL_TABLET | Freq: Three times a day (TID) | ORAL | 0 refills | Status: AC | PRN

## 2020-09-28 NOTE — Assessment & Plan Note (Signed)
Continued abdominal pain, worsened since last visit.  CT scan and labs reassuring.  Pain lessened when abdominal muscles relaxed so may be MSK cause.  Will check CK and Lyme titers.   Add flexeril and tramadol as needed.  I don't think he needs to continue cipro or flagyl at this time.

## 2020-09-28 NOTE — Telephone Encounter (Signed)
Patient scheduled for today

## 2020-09-28 NOTE — Progress Notes (Signed)
Travis Li - 39 y.o. male MRN [id number]  Date of birth: 1981/05/21  Subjective Chief Complaint  Patient presents with  . Abdominal Pain    HPI Travis Li is a 39 y.o. male here today for follow up of abdominal pain.  He was seen by me on 10/5 for LLQ pain, suspected diverticulitis.  Labs normal and CT did not show diverticulitis however there was some bladder thickening.  He completed course of cipro and felt normal for a day or two however symptoms returned.  He followed up with [facility] on 10/18 and had UA and stool studies ordered.  He was restarted on cipro as well as flagyl however his pain has continued to get worse since that time.  Pain continues in lower abdomen but now having some pain throughout hte mid abdomen.  Pain seems worse with movement.  He has not had changes to bowels, nausea or vomiting, fever or chills.  He has had some tick bites in the past as his wife runs an animal rescue group but does not recall any rashes associated with this.   ROS:  A comprehensive ROS was completed and negative except as noted per HPI  Allergies  Allergen Reactions  . Minocycline Hives    Lips swell  . Lisinopril Other (See Comments)    Fatigue    Past Medical History:  Diagnosis Date  . 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 Mitral valve prolapse 09/05/2016  . PVC's (premature ventricular contractions) 09/05/2016    History reviewed. No pertinent surgical history.  Social History   Socioeconomic History  . Marital status: Married    Spouse name: Not on file  . Number of children: Not on file  . Years of education: Not on file  . Highest education level: Not on file  Occupational History  . Not on file  Tobacco Use  . Smoking status: Never Smoker  . Smokeless tobacco: Never Used  Vaping Use  . Vaping Use: Never used  Substance and Sexual Activity  . Alcohol use: Yes  . Drug use: No  . Sexual activity: Yes    Partners: Female    Birth control/protection: Condom  Other  Topics Concern  . Not on file  Social History Narrative  . Not on file   Social Determinants of Health   Financial Resource Strain:   . Difficulty of Paying Living Expenses: Not on file  Food Insecurity:   . Worried About Charity fundraiser in the Last Year: Not on file  . Ran Out of Food in the Last Year: Not on file  Transportation Needs:   . Lack of Transportation (Medical): Not on file  . Lack of Transportation (Non-Medical): Not on file  Physical Activity:   . Days of Exercise per Week: Not on file  . Minutes of Exercise per Session: Not on file  Stress:   . Feeling of Stress : Not on file  Social Connections:   . Frequency of Communication with Friends and Family: Not on file  . Frequency of Social Gatherings with Friends and Family: Not on file  . Attends Religious Services: Not on file  . Active Member of Clubs or Organizations: Not on file  . Attends Archivist Meetings: Not on file  . Marital Status: Not on file    Family History  Problem Relation Age of Onset  . Hypercholesterolemia Mother   . Cancer Mother        Endometrial cancer  . Hypertension Mother   .  Diabetes Father   . Hypercholesterolemia Father     Health Maintenance  Topic Date Due  . INFLUENZA VACCINE  03/08/2021 (Originally 07/09/2020)  . Hepatitis C Screening  09/25/2021 (Originally [birth year])  . TETANUS/TDAP  07/15/2021  . COVID-19 Vaccine  Completed  . HIV Screening  Completed     ----------------------------------------------------------------------------------------------------------------------------------------------------------------------------------------------------------------- Physical Exam BP 129/83 (BP Location: Left Arm, Patient Position: Sitting, Cuff Size: Normal)   Pulse 81   Ht 6\' 7"  (2.007 m)   Wt 227 lb (103 kg)   SpO2 98%   BMI 25.57 kg/m   Physical Exam Constitutional:      Appearance: He is well-developed.  HENT:     Head: Normocephalic and  atraumatic.  Cardiovascular:     Rate and Rhythm: Normal rate and regular rhythm.  Abdominal:     General: Bowel sounds are normal. There is no distension.     Palpations: Abdomen is soft. There is no mass.     Tenderness: There is abdominal tenderness (Mild ttp along abdominal wall when sitting up.  Pain lessened when laying down and abdominal muscles relaxed.  ). There is no guarding or rebound.     Hernia: No hernia is present.  Skin:    General: Skin is warm and dry.     Findings: No rash.  Neurological:     Mental Status: He is alert.  Psychiatric:        Mood and Affect: Mood normal.        Behavior: Behavior normal.     ------------------------------------------------------------------------------------------------------------------------------------------------------------------------------------------------------------------- Assessment and Plan  Lower abdominal pain Continued abdominal pain, worsened since last visit.  CT scan and labs reassuring.  Pain lessened when abdominal muscles relaxed so may be MSK cause.  Will check CK and Lyme titers.   Add flexeril and tramadol as needed.  I don't think he needs to continue cipro or flagyl at this time.      Meds ordered this encounter  Medications  . cyclobenzaprine (FLEXERIL) 10 MG tablet    Sig: Take 1 tablet (10 mg total) by mouth 3 (three) times daily as needed for muscle spasms.    Dispense:  30 tablet    Refill:  0  . traMADol (ULTRAM) 50 MG tablet    Sig: Take 1 tablet (50 mg total) by mouth every 8 (eight) hours as needed for up to 5 days.    Dispense:  15 tablet    Refill:  0    No follow-ups on file.    This visit occurred during the SARS-CoV-2 public health emergency.  Safety protocols were in place, including screening questions prior to the visit, additional usage of staff PPE, and extensive cleaning of exam room while observing appropriate contact time as indicated for disinfecting solutions.

## 2020-09-28 NOTE — Patient Instructions (Signed)
Let's try adding a muscle relaxer (flexeril).  You can use this up to three times per day as needed. This can make you drowsy.  You can add tramadol every 8 hours as needed.  Try heating pad to area as needed.  Have labs completed today.

## 2020-09-29 LAB — CK: Total CK: 69 U/L (ref 44–196)

## 2020-09-29 LAB — B. BURGDORFI ANTIBODIES: B burgdorferi Ab IgG+IgM: <0.9 index

## 2020-10-02 NOTE — Telephone Encounter (Signed)
Thank you :)

## 2020-10-03 ENCOUNTER — Other Ambulatory Visit: Payer: Self-pay | Admitting: Family Medicine

## 2020-10-03 ENCOUNTER — Encounter: Payer: Self-pay | Admitting: Nurse Practitioner

## 2020-10-03 DIAGNOSIS — R1030 Lower abdominal pain, unspecified: Secondary | ICD-10-CM

## 2020-10-03 DIAGNOSIS — R195 Other fecal abnormalities: Secondary | ICD-10-CM

## 2020-10-19 ENCOUNTER — Ambulatory Visit (INDEPENDENT_AMBULATORY_CARE_PROVIDER_SITE_OTHER): Payer: BC Managed Care – PPO | Admitting: Nurse Practitioner

## 2020-10-19 ENCOUNTER — Encounter: Payer: Self-pay | Admitting: Nurse Practitioner

## 2020-10-19 VITALS — BP 100/70 | HR 88 | Ht 77.76 in | Wt 225.0 lb

## 2020-10-19 DIAGNOSIS — R194 Change in bowel habit: Secondary | ICD-10-CM | POA: Diagnosis not present

## 2020-10-19 DIAGNOSIS — R1032 Left lower quadrant pain: Secondary | ICD-10-CM | POA: Diagnosis not present

## 2020-10-19 DIAGNOSIS — K625 Hemorrhage of anus and rectum: Secondary | ICD-10-CM

## 2020-10-19 DIAGNOSIS — Z20822 Contact with and (suspected) exposure to covid-19: Secondary | ICD-10-CM | POA: Diagnosis not present

## 2020-10-19 MED ORDER — SUTAB 1479-225-188 MG PO TABS: 1.0000 | ORAL_TABLET | Freq: Once | ORAL | 0 refills | Status: AC

## 2020-10-19 NOTE — Progress Notes (Signed)
ASSESSMENT AND PLAN    # 39 yo male with recent development of LLQ pain and increased frequency of soft stools ( not diarrhea). Stool studies negative. Labs unrevealing. No diverticular diease on CT scan but possible cystitis.  Symptoms improved with a course of Cipro but then immediately recurred. Repeat course of cipro wasn't helpful . LLQ pain has improved but not resolved with muscle relaxers. Interestingly, bowel movements normalized immediately after discontinuation of milk ( did food sensitivity test at home) which is unusual since patient consumed milk daily for 40 years without an problems. He very occasionally has blood with a bowel movement. Hard to fit the pieces together but with the recent bowel changes, LLQ pain and occasional blood in stool I think a colonoscopy is reasonable.  --Patient will be scheduled for a colonoscopy. The risks and benefits of colonoscopy with possible polypectomy / biopsies were discussed and the patient agrees to proceed.   # History of left sided epiploic appendagitis April 2020. His recent LLQ pain was not nearly as bad as what he had with epiploic appendagitis   HISTORY OF PRESENT ILLNESS     Primary Gastroenterologist : [place]  Justice Britain, MD  Chief Complaint : abdominal pain and bowel changes.   Travis Li is a 39 y.o.healthy male referred by PCP for bowel changes and abdominal pain.   A month ago patient began having increased frequency of BMs. Stools were soft and formed but he was having up to 8 BMs a day, up from his norm of maybe once a day. Once or twice a year patient may have a small amount of blood in stool but none with the recent bowel changes. Patient had not made any medications nor dietary changes to account for the bowel changes In addition to the bowel changes patient was having LLQ pain. The LLQ did not improve with defecation  The only thing that helped the pain was being still.  Patient saw PCP on 10/5, got labs, sent  for CT scan and was started empirically on Cipro / flagyl for diverticulitis . CT scan showed bladder wall thickening, no diverticular disease or other acute findings. PCP stopped flagyl but continued the cipro for possible cystitis.  Patients symptoms significantly improved with antibiotics. Two days after completion of antibiotics the symptoms recurred and patient saw NP at his PCP's office. Stool studies obtained. C-diff was negative, salmonella / shigella negative. Appears stool culture ordered by not collected?  He was prescribed another round of cipro but it it didn't work nearly as well that time.  He was prescribed a muscle relaxer for the LLQ pain which helped but didn't alleviate the pain.   Due to ongoing bowel changes patient decided to take a food allergy test. He received results on 11/1 . He had sensitives to six food items. One of those items was milk which he had consumed without any problems for 40 years. Almonds and egg whites were also listed as sensitivities.  However, after completely eliminatingmilk / ice cream from his diet bowel movement completely normalized. He still has the LLQ discomfort but it is much more tolerable.    Of note, in April 2020 he had LLQ pain and CT scan showed mild to distal descending colon epiploic appendagitis. However the LLQ pain back then was much worse.    Data Reviewed:  09/13/20 CT scan  abd/ pelvis with contrast 1. No diverticular disease. No bowel wall thickening or bowel obstruction. No abscess in  the abdomen or pelvis. Appendix appears normal. 2. Borderline urinary bladder wall thickening which could indicate a degree of cystitis. Correlation with urinalysis advised in this regard. 3. Hepatic steatosis. No renal or ureteral calculus. No hydronephrosis  October 2021 CBC normal.  CMET normal. Stool for c-diff, salmonella, shigella negative.  U/A normal   March 2021 TSH normal   Previous Endoscopic Evaluations / Pertinent Studies:    none   Past Medical History:  Diagnosis Date  . 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 Migraines   . Mitral valve prolapse 09/05/2016  . PVC's (premature ventricular contractions) 09/05/2016     Past Surgical History:  Procedure Laterality Date  . WISDOM TOOTH EXTRACTION     Family History  Problem Relation Age of Onset  . Hypercholesterolemia Mother   . Cancer Mother        Endometrial cancer  . Hypertension Mother   . Diabetes Father   . Colon polyps Father   . Irritable bowel syndrome Father   . Heart disease Paternal Grandfather   . Diabetes Paternal Grandfather        borderline   Social History   Tobacco Use  . Smoking status: Never Smoker  . Smokeless tobacco: Never Used  Vaping Use  . Vaping Use: Never used  Substance Use Topics  . Alcohol use: Yes    Comment: 2 per week  . Drug use: No   Current Outpatient Medications  Medication Sig Dispense Refill  . albuterol (PROVENTIL HFA;VENTOLIN HFA) 108 (90 Base) MCG/ACT inhaler Inhale 2 puffs into the lungs every 6 (six) hours as needed for wheezing or shortness of breath. 1 Inhaler 0  . fexofenadine (ALLEGRA) 180 MG tablet Take 180 mg by mouth daily.     . Multiple Vitamin (MULTIVITAMIN WITH MINERALS) TABS tablet Take 1 tablet by mouth daily.    . Omega-3 Fatty Acids (FISH OIL) 1000 MG CAPS Take 1 capsule by mouth daily.    . cyclobenzaprine (FLEXERIL) 10 MG tablet Take 1 tablet (10 mg total) by mouth 3 (three) times daily as needed for muscle spasms. (Patient not taking: Reported on 10/19/2020) 30 tablet 0   No current facility-administered medications for this visit.   Allergies  Allergen Reactions  . Minocycline Hives    Lips swell  . Lisinopril Other (See Comments)    Fatigue     Review of Systems: Positive for headaches.  All other systems reviewed and negative except where noted in HPI.   PHYSICAL EXAM :    Wt Readings from Last 3 Encounters:  10/19/20 225 lb (102.1 kg)  09/28/20 227 lb (103  kg)  09/25/20 227 lb 3.2 oz (103.1 kg)    BP 100/70 (BP Location: Left Arm, Patient Position: Sitting, Cuff Size: Normal)   Pulse 88   Ht 6' 5.76" (1.975 m) Comment: height measured without shoes  Wt 225 lb (102.1 kg)   BMI 26.16 kg/m  Constitutional:  Pleasant male in no acute distress. Psychiatric: Normal mood and affect. Behavior is normal. EENT: Pupils normal.  Conjunctivae are normal. No scleral icterus. Neck supple.  Cardiovascular: Normal rate, regular rhythm. No edema Pulmonary/chest: Effort normal and breath sounds normal. No wheezing, rales or rhonchi. Abdominal: Soft, nondistended, mild localized LLQ tenderness.  Bowel sounds active throughout. There are no masses palpable. No hepatomegaly. Neurological: Alert and oriented to person place and time. Skin: Skin is warm and dry. No rashes noted.  Tye Savoy, NP  10/19/2020, 3:31 PM  Cc:  Referring Provider Zigmund Daniel,  Cody, DO

## 2020-10-19 NOTE — Patient Instructions (Signed)
If you are age 40 or older, your body mass index should be between 23-30. Your Body mass index is 26.16 kg/m. If this is out of the aforementioned range listed, please consider follow up with your Primary Care Provider.  If you are age 8 or younger, your body mass index should be between 19-25. Your Body mass index is 26.16 kg/m. If this is out of the aformentioned range listed, please consider follow up with your Primary Care Provider.   You have been scheduled for a colonoscopy. Please follow written instructions given to you at your visit today.  Please pick up your prep supplies at the pharmacy within the next 1-3 days. If you use inhalers (even only as needed), please bring them with you on the day of your procedure.  Due to recent changes in healthcare laws, you may see the results of your imaging and laboratory studies on MyChart before your provider has had a chance to review them.  We understand that in some cases there may be results that are confusing or concerning to you. Not all laboratory results come back in the same time frame and the provider may be waiting for multiple results in order to interpret others.  Please give [place] 48 hours in order for your provider to thoroughly review all the results before contacting the office for clarification of your results.

## 2020-10-22 NOTE — Progress Notes (Signed)
Attending Physician's Attestation   I have reviewed the chart.   I agree with the Advanced Practitioner's note, impression, and recommendations with any updates as below.    Bader Stubblefield Mansouraty, MD [facility]oscopy Office # [phone]  

## 2020-10-22 NOTE — Telephone Encounter (Signed)
Form completed and placed in assistant's box

## 2020-10-27 ENCOUNTER — Telehealth: Payer: Self-pay

## 2020-10-27 NOTE — Telephone Encounter (Signed)
Corrected FMLA documentation has been completed and faxed back to [facility].   Copy has been filed.

## 2020-11-29 ENCOUNTER — Other Ambulatory Visit: Payer: Self-pay

## 2020-11-29 ENCOUNTER — Emergency Department (INDEPENDENT_AMBULATORY_CARE_PROVIDER_SITE_OTHER)
Admission: RE | Admit: 2020-11-29 | Discharge: 2020-11-29 | Disposition: A | Discharge status: Home / Self Care | Payer: BC Managed Care – PPO | Source: Ambulatory Visit | Attending: Family Medicine | Admitting: Family Medicine

## 2020-11-29 VITALS — BP 124/82 | HR 88 | Temp 97.9°F | Resp 18 | Ht 79.0 in | Wt 219.0 lb

## 2020-11-29 DIAGNOSIS — J0100 Acute maxillary sinusitis, unspecified: Secondary | ICD-10-CM | POA: Diagnosis not present

## 2020-11-29 MED ORDER — PREDNISONE 10 MG (21) PO TBPK: ORAL_TABLET | Freq: Every day | ORAL | 0 refills | Status: DC

## 2020-11-29 MED ORDER — AMOXICILLIN 500 MG PO CAPS: 500.0000 mg | ORAL_CAPSULE | Freq: Three times a day (TID) | ORAL | 0 refills | Status: DC

## 2020-11-29 NOTE — ED Triage Notes (Signed)
Pt has been feeling unwell for 2 weeks, cough and congestion. Hx of and allergies asthma, currently taking mucinex and nyquil otc w/ no improvement.

## 2020-11-29 NOTE — ED Provider Notes (Signed)
[facility]    CSN: [id number] Arrival date & time: 11/29/20  1348      History   Chief Complaint Chief Complaint  Patient presents with  . Cough  . Nasal Congestion    HPI Travis Li is a 39 y.o. male.   Patient has history of allergies this seemed to start with congestion sneezing cough but has persisted now for 2 weeks.  Numerous OTC medicines.  Has a rescue inhaler that he is used daily recently.  Cough is productive of colored sputum  HPI  Past Medical History:  Diagnosis Date  . 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 Migraines   . Mitral valve prolapse 09/05/2016  . PVC's (premature ventricular contractions) 09/05/2016    Patient Active Problem List   Diagnosis Date Noted  . Lower abdominal pain 09/12/2020  . Epistaxis 06/14/2020  . Tinea pedis 05/09/2020  . Essential hypertension 01/12/2019  . 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 Dyslipidemia 01/30/2018  . PVC's (premature ventricular contractions) 09/05/2016  . Environmental allergies 06/22/2012  . History of migraine headaches 06/22/2012    Past Surgical History:  Procedure Laterality Date  . WISDOM TOOTH EXTRACTION         Home Medications    Prior to Admission medications   Medication Sig Start Date End Date Taking? Authorizing Provider  albuterol (PROVENTIL HFA;VENTOLIN HFA) 108 (90 Base) MCG/ACT inhaler Inhale 2 puffs into the lungs every 6 (six) hours as needed for wheezing or shortness of breath. 02/22/19   Rodolph Bongorey, Evan S, MD  amoxicillin (AMOXIL) 500 MG capsule Take 1 capsule (500 mg total) by mouth 3 (three) times daily. 11/29/20   Frederica KusterMiller, Sabetha Cohick M, MD  cyclobenzaprine (FLEXERIL) 10 MG tablet Take 1 tablet (10 mg total) by mouth 3 (three) times daily as needed for muscle spasms. Patient not taking: Reported on 10/19/2020 09/28/20   Everrett CoombeMatthews, Cody, DO  fexofenadine (ALLEGRA) 180 MG tablet Take 180 mg by mouth daily.     [provider]  Multiple Vitamin (MULTIVITAMIN  WITH MINERALS) TABS tablet Take 1 tablet by mouth daily.    [provider]  Omega-3 Fatty Acids (FISH OIL) 1000 MG CAPS Take 1 capsule by mouth daily.    [provider]  predniSONE (STERAPRED UNI-PAK 21 TAB) 10 MG (21) TBPK tablet Take by mouth daily. Take 6 tabs by mouth daily  for 2 days, then 5 tabs for 2 days, then 4 tabs for 2 days, then 3 tabs for 2 days, 2 tabs for 2 days, then 1 tab by mouth daily for 2 days 11/29/20   Frederica KusterMiller, Cleveland Paiz M, MD    Family History Family History  Problem Relation Age of Onset  . Hypercholesterolemia Mother   . Cancer Mother        Endometrial cancer  . Hypertension Mother   . Diabetes Father   . Colon polyps Father   . Irritable bowel syndrome Father   . Heart disease Paternal Grandfather   . Diabetes Paternal Grandfather        borderline    Social History Social History   Tobacco Use  . Smoking status: Never Smoker  . Smokeless tobacco: Never Used  Vaping Use  . Vaping Use: Never used  Substance Use Topics  . Alcohol use: Not Currently    Comment: 2 per week  . Drug use: No     Allergies   Minocycline and Lisinopril   Review of Systems Review of Systems  HENT: Positive for congestion.  Respiratory: Positive for cough and wheezing.   All other systems reviewed and are negative.    Physical Exam Triage Vital Signs ED Triage Vitals  Enc Vitals Group     BP 11/29/20 1403 124/82     Pulse Rate 11/29/20 1403 88     Resp 11/29/20 1403 18     Temp 11/29/20 1403 97.9 F (36.6 C)     Temp Source 11/29/20 1403 Oral     SpO2 11/29/20 1403 96 %     Weight 11/29/20 1401 219 lb (99.3 kg)     Height 11/29/20 1401 6\' 7"  (2.007 m)     Head Circumference --      Peak Flow --      Pain Score 11/29/20 1407 0     Pain Loc --      Pain Edu? --      Excl. in [place]? --    No data found.  Updated Vital Signs BP 124/82 (BP Location: Left Arm)   Pulse 88   Temp 97.9 F (36.6 C) (Oral)   Resp 18   Ht 6\' 7"  (2.007 m)    Wt 99.3 kg   SpO2 96%   BMI 24.67 kg/m   Visual Acuity Right Eye Distance:   Left Eye Distance:   Bilateral Distance:    Right Eye Near:   Left Eye Near:    Bilateral Near:     Physical Exam Vitals and nursing note reviewed.  Constitutional:      Appearance: Normal appearance.  HENT:     Right Ear: Tympanic membrane normal.     Left Ear: Tympanic membrane normal.     Mouth/Throat:     Mouth: Mucous membranes are moist.  Cardiovascular:     Rate and Rhythm: Normal rate and regular rhythm.  Pulmonary:     Effort: Pulmonary effort is normal.  Neurological:     Mental Status: He is alert.      UC Treatments / Results  Labs (all labs ordered are listed, but only abnormal results are displayed) Labs Reviewed - No data to display  EKG   Radiology No results found.  Procedures Procedures (including critical care time)  Medications Ordered in UC Medications - No data to display  Initial Impression / Assessment and Plan / UC Course  I have reviewed the triage vital signs and the nursing notes.  Pertinent labs & imaging results that were available during my care of the patient were reviewed by me and considered in my medical decision making (see chart for details).     Respiratory allergies with sinusitis Final Clinical Impressions(s) / UC Diagnoses   Final diagnoses:  Acute non-recurrent maxillary sinusitis   Discharge Instructions   None    ED Prescriptions    Medication Sig Dispense Auth. Provider   amoxicillin (AMOXIL) 500 MG capsule Take 1 capsule (500 mg total) by mouth 3 (three) times daily. 21 capsule Wardell Honour, MD   predniSONE (STERAPRED UNI-PAK 21 TAB) 10 MG (21) TBPK tablet Take by mouth daily. Take 6 tabs by mouth daily  for 2 days, then 5 tabs for 2 days, then 4 tabs for 2 days, then 3 tabs for 2 days, 2 tabs for 2 days, then 1 tab by mouth daily for 2 days 42 tablet Wardell Honour, MD     PDMP not reviewed this encounter.    Wardell Honour, MD 11/29/20 1626

## 2020-12-12 ENCOUNTER — Encounter: Payer: Self-pay | Admitting: Gastroenterology

## 2020-12-14 ENCOUNTER — Ambulatory Visit (AMBULATORY_SURGERY_CENTER): Payer: BC Managed Care – PPO | Admitting: Gastroenterology

## 2020-12-14 ENCOUNTER — Telehealth: Payer: Self-pay | Admitting: Internal Medicine

## 2020-12-14 ENCOUNTER — Encounter: Payer: Self-pay | Admitting: Gastroenterology

## 2020-12-14 ENCOUNTER — Other Ambulatory Visit: Payer: Self-pay

## 2020-12-14 VITALS — BP 128/78 | HR 64 | Temp 98.0°F | Resp 27 | Ht 77.0 in | Wt 225.0 lb

## 2020-12-14 DIAGNOSIS — K644 Residual hemorrhoidal skin tags: Secondary | ICD-10-CM

## 2020-12-14 DIAGNOSIS — K635 Polyp of colon: Secondary | ICD-10-CM

## 2020-12-14 DIAGNOSIS — K625 Hemorrhage of anus and rectum: Secondary | ICD-10-CM | POA: Diagnosis not present

## 2020-12-14 DIAGNOSIS — K641 Second degree hemorrhoids: Secondary | ICD-10-CM

## 2020-12-14 DIAGNOSIS — D123 Benign neoplasm of transverse colon: Secondary | ICD-10-CM

## 2020-12-14 DIAGNOSIS — R1032 Left lower quadrant pain: Secondary | ICD-10-CM | POA: Diagnosis not present

## 2020-12-14 DIAGNOSIS — D128 Benign neoplasm of rectum: Secondary | ICD-10-CM | POA: Diagnosis not present

## 2020-12-14 DIAGNOSIS — R197 Diarrhea, unspecified: Secondary | ICD-10-CM

## 2020-12-14 MED ORDER — SODIUM CHLORIDE 0.9 % IV SOLN: 500.0000 mL | Freq: Once | INTRAVENOUS | Status: DC

## 2020-12-14 NOTE — Progress Notes (Signed)
Pt's states no medical or surgical changes since previsit or office visit. 

## 2020-12-14 NOTE — Patient Instructions (Signed)
YOU HAD AN ENDOSCOPIC PROCEDURE TODAY AT THE [facility]:   Refer to the procedure report that was given to you for any specific questions about what was found during the examination.  If the procedure report does not answer your questions, please call your gastroenterologist to clarify.  If you requested that your care partner not be given the details of your procedure findings, then the procedure report has been included in a sealed envelope for you to review at your convenience later.  YOU SHOULD EXPECT: Some feelings of bloating in the abdomen. Passage of more gas than usual.  Walking can help get rid of the air that was put into your GI tract during the procedure and reduce the bloating. If you had a lower endoscopy (such as a colonoscopy or flexible sigmoidoscopy) you may notice spotting of blood in your stool or on the toilet paper. If you underwent a bowel prep for your procedure, you may not have a normal bowel movement for a few days.  **Handouts given on hemorrhoids, polyps and high fiber diet**  Please Note:  You might notice some irritation and congestion in your nose or some drainage.  This is from the oxygen used during your procedure.  There is no need for concern and it should clear up in a day or so.  SYMPTOMS TO REPORT IMMEDIATELY:   Following lower endoscopy (colonoscopy or flexible sigmoidoscopy):  Excessive amounts of blood in the stool  Significant tenderness or worsening of abdominal pains  Swelling of the abdomen that is new, acute  Fever of 100F or higher   For urgent or emergent issues, a gastroenterologist can be reached at any hour by calling [phone]. Do not use MyChart messaging for urgent concerns.    DIET:  We do recommend a small meal at first, but then you may proceed to your regular diet.  Drink plenty of fluids but you should avoid alcoholic beverages for 24 hours.  ACTIVITY:  You should plan to take it easy for the rest of today and  you should NOT DRIVE or use heavy machinery until tomorrow (because of the sedation medicines used during the test).    FOLLOW UP: Our staff will call the number listed on your records 48-72 hours following your procedure to check on you and address any questions or concerns that you may have regarding the information given to you following your procedure. If we do not reach you, we will leave a message.  We will attempt to reach you two times.  During this call, we will ask if you have developed any symptoms of COVID 19. If you develop any symptoms (ie: fever, flu-like symptoms, shortness of breath, cough etc.) before then, please call [phone].  If you test positive for Covid 19 in the 2 weeks post procedure, please call and report this information to [place].    If any biopsies were taken you will be contacted by phone or by letter within the next 1-3 weeks.  Please call us at [phone] if you have not heard about the biopsies in 3 weeks.    SIGNATURES/CONFIDENTIALITY: You and/or your care partner have signed paperwork which will be entered into your electronic medical record.  These signatures attest to the fact that that the information above on your After Visit Summary has been reviewed and is understood.  Full responsibility of the confidentiality of this discharge information lies with you and/or your care-partner.

## 2020-12-14 NOTE — Progress Notes (Signed)
Called to room to assist during endoscopic procedure.  Patient ID and intended procedure confirmed with present staff. Received instructions for my participation in the procedure from the performing physician.  

## 2020-12-14 NOTE — Telephone Encounter (Signed)
GI ON CALL NOTE  Patient's wife called regarding abdominal pain, bleeding, and vagal type symptoms while defecating. Had colonoscopy with biopsies, polypectomy, and rectal EMR with one clip placed this afternoon (procedure and chart reviewed). He has been having intermittent left sided abdominal pain for some time. Actually, worse BEFORE today's procedure. History of epiploic appendagitis on remote CT, but not most recent CT. Felt fine post procedure and ate dinner without issues. Feeling better at the time of the phone call.   Reassured. Do not think transient pain is procedure related. Would expect minor rectal bleeding given the resection size and location. Explained a vagal reaction. Gave parameters to call or go to ER. All questions answered. She was appreciative.  Message forwarded to Dr. Meridee Score

## 2020-12-14 NOTE — Op Note (Signed)
[facility] Patient Name: Travis Li Procedure Date: 12/14/2020 1:52 PM MRN: [id number] Endoscopist: Justice Britain , MD Age: 40 Referring MD:  Date of Birth: September 03, 1981 Gender: Male Account #: [id number] Procedure:                Colonoscopy Indications:              Abdominal pain in the left lower quadrant,                            Diarrhea, Hematochezia, History prior epiploic                            appendigitis Medicines:                Monitored Anesthesia Care Procedure:                Pre-Anesthesia Assessment:                           - Prior to the procedure, a History and Physical                            was performed, and patient medications and                            allergies were reviewed. The patient's tolerance of                            previous anesthesia was also reviewed. The risks                            and benefits of the procedure and the sedation                            options and risks were discussed with the patient.                            All questions were answered, and informed consent                            was obtained. Prior Anticoagulants: The patient has                            taken no previous anticoagulant or antiplatelet                            agents except for NSAID medication. ASA Grade                            Assessment: II - A patient with mild systemic                            disease. After reviewing the risks and benefits,  the patient was deemed in satisfactory condition to                            undergo the procedure.                           After obtaining informed consent, the colonoscope                            was passed under direct vision. Throughout the                            procedure, the patient's blood pressure, pulse, and                            oxygen saturations were monitored continuously. The                             Olympus CF-HQ190L [id number]) Colonoscope was                            introduced through the anus and advanced to the the                            cecum, identified by the ileocecal valve. The                            colonoscopy was performed without difficulty. The                            patient tolerated the procedure. The quality of the                            bowel preparation was good. The terminal ileum,                            ileocecal valve, appendiceal orifice, and rectum                            were photographed. Scope In: 2:04:19 PM Scope Out: 2:49:55 PM Scope Withdrawal Time: 0 hours 42 minutes 16 seconds  Total Procedure Duration: 0 hours 45 minutes 36 seconds  Findings:                 Skin tags were found on perianal exam.                           The digital rectal exam findings include                            hemorrhoids. Pertinent negatives include no                            palpable rectal lesions.  The terminal ileum and ileocecal valve appeared                            normal. Biopsies were taken with a cold forceps for                            histology.                           A 3 mm polyp was found in the hepatic flexure. The                            polyp was sessile. The polyp was removed with a                            cold snare. Resection and retrieval were complete.                           A 20 mm polypoid lesion was found in the distal                            rectum. The polyp was granular lateral spreading.                            Preparations were made for mucosal resection. NBI                            imaging and White-light endoscopy was done to                            demarcate the borders of the lesion. Saline was                            injected to raise the lesion. Piecemeal cold                            mucosal resection using a snare was performed.                             Resection and retrieval were complete. The majority                            of the resection site was clean without oozing.                            There was one area that had slow ooze, likely to                            stop on its own, but to prevent further bleeding                            after mucosal resection, one hemostatic clip was  successfully placed (MR conditional) on that site.                            There was no significant bleeding at the end of the                            procedure.                           Normal mucosa was found in the entire colon                            otherwise. Biopsies for histology were taken with a                            cold forceps from the entire colon for evaluation                            of microscopic colitis.                           Non-bleeding non-thrombosed external and internal                            hemorrhoids were found during retroflexion, during                            perianal exam and during digital exam. The                            hemorrhoids were Grade II (internal hemorrhoids                            that prolapse but reduce spontaneously). Complications:            No immediate complications. Estimated Blood Loss:     Estimated blood loss was minimal. Impression:               - Perianal skin tags found on perianal exam.                           - Hemorrhoids found on digital rectal exam.                           - The examined portion of the ileum was normal.                            Biopsied.                           - One 3 mm polyp at the hepatic flexure, removed                            with a cold snare. Resected and retrieved.                           -  One 20 mm polyp in the distal rectum, removed                            with piecemeal cold mucosal resection. Resected and                            retrieved. One clip (MR  conditional) was placed.                           - Normal mucosa in the entire examined colon.                            Biopsied.                           - Non-bleeding non-thrombosed external and internal                            hemorrhoids. Recommendation:           - The patient will be observed post-procedure,                            until all discharge criteria are met.                           - Discharge patient to home.                           - Patient has a contact number available for                            emergencies. The signs and symptoms of potential                            delayed complications were discussed with the                            patient. Return to normal activities tomorrow.                            Written discharge instructions were provided to the                            patient.                           - High fiber diet.                           - Use FiberCon 1-2 tablets PO daily.                           - Continue present medications.                           - Await pathology  results.                           - Repeat colonoscopy in 6-12 months for                            surveillance after piecemeal resection.                           - The findings and recommendations were discussed                            with the patient.                           - The findings and recommendations were discussed                            with the patient's family. Justice Britain, MD 12/14/2020 3:03:19 PM

## 2020-12-14 NOTE — Progress Notes (Unsigned)
PT taken to PACU. Monitors in place. VSS. Report given to RN. 

## 2020-12-15 NOTE — Telephone Encounter (Signed)
I spoke with the pt this morning and he states he is doing fine. No vagal symptoms, no abd pain.  He says he slept well over night with no furtehr issues.  He has been advised to call our office back if he has any return of symptoms.  The pt has been advised of the information and verbalized understanding.

## 2020-12-15 NOTE — Telephone Encounter (Signed)
JP, Thank you for taking the call last night. Agree with all your thoughts. Patty please call the patient later this morning to see how he has done over the course the rest of the evening. Thank you. GM

## 2020-12-17 NOTE — Telephone Encounter (Signed)
Thanks for the update Patty.  I also spoke with the patient on Friday afternoon and he was doing and feeling well without any evidence of persistent or recurrent bleeding. We will see what the pathology shows and go from there. Thanks. GM

## 2020-12-18 ENCOUNTER — Telehealth: Payer: Self-pay

## 2020-12-18 NOTE — Telephone Encounter (Signed)
  Follow up Call-  Call back number 12/14/2020  Post procedure Call Back phone  # [phone]  Permission to leave phone message Yes  Some recent data might be hidden     Patient questions:  Do you have a fever, pain , or abdominal swelling? No. Pain Score  0 *  Have you tolerated food without any problems? Yes.    Have you been able to return to your normal activities? Yes.    Do you have any questions about your discharge instructions: Diet   No. Medications  No. Follow up visit  No.  Do you have questions or concerns about your Care? No.  Actions: * If pain score is 4 or above: No action needed, pain <4.  Have you developed a fever since your procedure? No 2.   Have you had an respiratory symptoms (SOB or cough) since your procedure? No  3.   Have you tested positive for COVID 19 since your procedure No  4.   Have you had any family members/close contacts diagnosed with the COVID 19 since your procedure?  No  If yes to any of these questions please route to Joylene John, RN and Joella Prince, RN

## 2020-12-23 ENCOUNTER — Encounter: Payer: Self-pay | Admitting: Gastroenterology

## 2020-12-29 DIAGNOSIS — G43909 Migraine, unspecified, not intractable, without status migrainosus: Secondary | ICD-10-CM | POA: Insufficient documentation

## 2021-01-01 ENCOUNTER — Other Ambulatory Visit: Payer: Self-pay

## 2021-01-01 ENCOUNTER — Encounter: Payer: Self-pay | Admitting: Cardiology

## 2021-01-01 ENCOUNTER — Ambulatory Visit (INDEPENDENT_AMBULATORY_CARE_PROVIDER_SITE_OTHER): Payer: BC Managed Care – PPO | Admitting: Cardiology

## 2021-01-01 VITALS — BP 122/90 | HR 73 | Ht 79.0 in | Wt 229.0 lb

## 2021-01-01 DIAGNOSIS — E785 Hyperlipidemia, unspecified: Secondary | ICD-10-CM

## 2021-01-01 DIAGNOSIS — I493 Ventricular premature depolarization: Secondary | ICD-10-CM

## 2021-01-01 DIAGNOSIS — I10 Essential (primary) hypertension: Secondary | ICD-10-CM | POA: Diagnosis not present

## 2021-01-01 NOTE — Progress Notes (Signed)
Cardiology Office Note:    Date:  01/01/2021   ID:  Travis Li, DOB May 14, 1981, MRN [id number]  PCP:  Luetta Nutting, DO  Cardiologist:  Jenean Lindau, MD   Referring MD: Luetta Nutting, DO    ASSESSMENT:    1. PVC's (premature ventricular contractions)   2. Essential hypertension   3. Dyslipidemia    PLAN:    In order of problems listed above:  1. Primary prevention stressed with the patient.  Importance of compliance with diet medication stressed any vocalized understanding.  EKG done today reveals sinus rhythm and nonspecific ST-T changes. 2. Mixed dyslipidemia: Mild in nature.  This is followed by primary care physician.  Diet was emphasized and he vocalized understanding. 3. PVCs: These have resolved largely and patient is very happy about it. 4. Patient has had elevated blood pressure in the past.  But now with lifestyle modification this is much better.  He plans to get back to his exercise program.  I told him to at least exercise and walk 5 days a week half an hour every day and he promises to do so. 5. Patient will be seen in follow-up appointment in 6 months or earlier if the patient has any concerns    Medication Adjustments/Labs and Tests Ordered: Current medicines are reviewed at length with the patient today.  Concerns regarding medicines are outlined above.  Orders Placed This Encounter  Procedures  . Basic metabolic panel  . CBC with Differential/Platelet  . Hepatic function panel  . Lipid panel  . TSH  . EKG 12-Lead   No orders of the defined types were placed in this encounter.    No chief complaint on file.    History of Present Illness:    Travis Li is a 40 y.o. male.  Patient has past medical history of elevated blood pressure, essential hypertension dyslipidemia and PVCs.  He denies any problems at this time and takes care of activities of daily living.  No chest pain orthopnea or PND.  He had a Covid infection but this was mild and he  has recovered.  At the time of my evaluation, the patient is alert awake oriented and in no distress.  Past Medical History:  Diagnosis Date  . Dyslipidemia 01/30/2018  . Environmental allergies 06/22/2012  . Epistaxis 06/14/2020  . Essential hypertension 01/12/2019  . 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 History of migraine headaches 06/22/2012  . Lower abdominal pain 09/12/2020  . Migraines   . Mitral valve prolapse 09/05/2016  . PVC's (premature ventricular contractions) 09/05/2016  . Tinea pedis 05/09/2020    Past Surgical History:  Procedure Laterality Date  . WISDOM TOOTH EXTRACTION      Current Medications: Current Meds  Medication Sig  . albuterol (PROVENTIL HFA;VENTOLIN HFA) 108 (90 Base) MCG/ACT inhaler Inhale 2 puffs into the lungs every 6 (six) hours as needed for wheezing or shortness of breath.  . fexofenadine (ALLEGRA) 180 MG tablet Take 180 mg by mouth daily.   . Multiple Vitamin (MULTIVITAMIN WITH MINERALS) TABS tablet Take 1 tablet by mouth daily.  . Omega-3 Fatty Acids (FISH OIL) 1000 MG CAPS Take 1 capsule by mouth daily.     Allergies:   Minocycline and Lisinopril   Social History   Socioeconomic History  . Marital status: Married    Spouse name: Not on file  . Number of children: 1  . Years of education: Not on file  . Highest education level: Not on file  Occupational History  .  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 Smoking status: Never Smoker  . Smokeless tobacco: Never Used  Vaping Use  . Vaping Use: Never used  Substance and Sexual Activity  . Alcohol use: Not Currently    Comment: 2 per week  . Drug use: No  . Sexual activity: Yes    Partners: Female    Birth control/protection: Condom  Other Topics Concern  . Not on file  Social History Narrative  . Not on file   Social Determinants of Health   Financial Resource Strain: Not on file  Food Insecurity: Not on file  Transportation Needs: Not on file  Physical  Activity: Not on file  Stress: Not on file  Social Connections: Not on file     Family History: The patient's family history includes Cancer in his mother; Colon polyps in his father; Diabetes in his father and paternal grandfather; Heart disease in his paternal grandfather; Hypercholesterolemia in his mother; Hypertension in his mother; Irritable bowel syndrome in his father.  ROS:   Please see the history of present illness.    All other systems reviewed and are negative.  EKGs/Labs/Other Studies Reviewed:    The following studies were reviewed today: I discussed my findings with the patient extensively.   Recent Labs: 02/15/2020: TSH 2.660 09/12/2020: ALT 21; BUN 22; Creat 1.35; Hemoglobin 15.3; Platelets 265; Potassium 4.1; Sodium 139  Recent Lipid Panel    Component Value Date/Time   CHOL 189 02/15/2020 0834   TRIG 144 02/15/2020 0834   HDL 43 02/15/2020 0834   CHOLHDL 4.4 02/15/2020 0834   CHOLHDL 3.8 02/22/2019 0807   LDLCALC 120 (H) 02/15/2020 0834   LDLCALC 104 (H) 02/22/2019 0807    Physical Exam:    VS:  BP 122/90   Pulse 73   Ht 6\' 7"  (2.007 m)   Wt 229 lb 0.6 oz (103.9 kg)   SpO2 99%   BMI 25.80 kg/m     Wt Readings from Last 3 Encounters:  01/01/21 229 lb 0.6 oz (103.9 kg)  12/14/20 225 lb (102.1 kg)  11/29/20 219 lb (99.3 kg)     GEN: Patient is in no acute distress HEENT: Normal NECK: No JVD; No carotid bruits LYMPHATICS: No lymphadenopathy CARDIAC: Hear sounds regular, 2/6 systolic murmur at the apex. RESPIRATORY:  Clear to auscultation without rales, wheezing or rhonchi  ABDOMEN: Soft, non-tender, non-distended MUSCULOSKELETAL:  No edema; No deformity  SKIN: Warm and dry NEUROLOGIC:  Alert and oriented x 3 PSYCHIATRIC:  Normal affect   Signed, Jenean Lindau, MD  01/01/2021 12:23 PM    [facility]

## 2021-01-01 NOTE — Patient Instructions (Signed)

## 2021-01-08 DIAGNOSIS — M9904 Segmental and somatic dysfunction of sacral region: Secondary | ICD-10-CM | POA: Diagnosis not present

## 2021-01-08 DIAGNOSIS — M9902 Segmental and somatic dysfunction of thoracic region: Secondary | ICD-10-CM | POA: Diagnosis not present

## 2021-01-08 DIAGNOSIS — M9903 Segmental and somatic dysfunction of lumbar region: Secondary | ICD-10-CM | POA: Diagnosis not present

## 2021-01-08 DIAGNOSIS — M9906 Segmental and somatic dysfunction of lower extremity: Secondary | ICD-10-CM | POA: Diagnosis not present

## 2021-06-27 ENCOUNTER — Ambulatory Visit (INDEPENDENT_AMBULATORY_CARE_PROVIDER_SITE_OTHER): Payer: BC Managed Care – PPO | Admitting: Family Medicine

## 2021-06-27 ENCOUNTER — Other Ambulatory Visit: Payer: Self-pay

## 2021-06-27 ENCOUNTER — Encounter: Payer: Self-pay | Admitting: Family Medicine

## 2021-06-27 DIAGNOSIS — M7742 Metatarsalgia, left foot: Secondary | ICD-10-CM

## 2021-06-27 DIAGNOSIS — M7741 Metatarsalgia, right foot: Secondary | ICD-10-CM | POA: Insufficient documentation

## 2021-06-27 HISTORY — DX: Metatarsalgia, right foot: M77.41

## 2021-06-27 NOTE — Assessment & Plan Note (Signed)
Recommend trial of metatarsal pad and provided with medium to large sizes to try.  He can use ibuprofen or Aleve as needed for pain.  We discussed that if not improving to follow-up.

## 2021-06-27 NOTE — Progress Notes (Signed)
Travis Li - 40 y.o. male MRN [id number]  Date of birth: 01/31/1981  Subjective Chief Complaint  Patient presents with   Foot Pain    HPI Travis Li is a 40 year old male here today with complaint of bilateral foot pain.  The pain is located on the forefoot bilaterally.  Points the area over the metatarsal heads.  This started a couple weeks ago.  He has had to stop and rest at times due to pain.  He denies any associated numbness or tingling.  He has not had any injury to this area of the foot.  He does have some swelling at times.  He has not noticed any significant redness or warmth.  He has not had any other joints that are bothering him.  ROS:  A comprehensive ROS was completed and negative except as noted per HPI  Allergies  Allergen Reactions   Minocycline Hives    Lips swell   Lisinopril Other (See Comments)    Fatigue    Past Medical History:  Diagnosis Date   Dyslipidemia 01/30/2018   Environmental allergies 06/22/2012   Epistaxis 06/14/2020   Essential hypertension 01/12/2019   Exercise-induced asthma 01/30/2018   History of migraine headaches 06/22/2012   Lower abdominal pain 09/12/2020   Migraines    Mitral valve prolapse 09/05/2016   PVC's (premature ventricular contractions) 09/05/2016   Tinea pedis 05/09/2020    Past Surgical History:  Procedure Laterality Date   WISDOM TOOTH EXTRACTION      Social History   Socioeconomic History   Marital status: Married    Spouse name: Not on file   Number of children: 1   Years of education: Not on file   Highest education level: Not on file  Occupational History   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  Tobacco Use   Smoking status: Never   Smokeless tobacco: Never  Vaping Use   Vaping Use: Never used  Substance and Sexual Activity   Alcohol use: Not Currently    Comment: 2 per week   Drug use: No   Sexual activity: Yes    Partners: Female    Birth control/protection: Condom  Other Topics Concern   Not on  file  Social History Narrative   Not on file   Social Determinants of Health   Financial Resource Strain: Not on file  Food Insecurity: Not on file  Transportation Needs: Not on file  Physical Activity: Not on file  Stress: Not on file  Social Connections: Not on file    Family History  Problem Relation Age of Onset   Hypercholesterolemia Mother    Cancer Mother        Endometrial cancer   Hypertension Mother    Diabetes Father    Colon polyps Father    Irritable bowel syndrome Father    Heart disease Paternal Grandfather    Diabetes Paternal Grandfather        borderline    Health Maintenance  Topic Date Due   Pneumococcal Vaccine 23-74 Years old (1 - PCV) Never done   COVID-19 Vaccine (3 - Moderna risk series) 04/09/2020   Hepatitis C Screening  09/25/2021 (Originally 05/05/1998)   INFLUENZA VACCINE  07/09/2021   TETANUS/TDAP  07/15/2021   COLONOSCOPY (Pts 45-68yrs Insurance coverage will need to be confirmed)  12/14/2021   HIV Screening  Completed   HPV VACCINES  Aged Out     ----------------------------------------------------------------------------------------------------------------------------------------------------------------------------------------------------------------- Physical Exam BP 126/76 (BP Location: Left Arm, Patient Position: Sitting, Cuff Size:  Normal)   Pulse 78   Temp 97.8 F (36.6 C)   Ht 6\' 7"  (2.007 m)   Wt 230 lb (104.3 kg)   SpO2 100%   BMI 25.91 kg/m   Physical Exam Constitutional:      Appearance: Normal appearance.  HENT:     Head: Normocephalic and atraumatic.  Musculoskeletal:     Comments: Tenderness to palpation over bilateral metatarsal heads.  There is no significant swelling, redness or warmth over these areas.  No pain with squeeze test.  Neurological:     Mental Status: He is alert.     ------------------------------------------------------------------------------------------------------------------------------------------------------------------------------------------------------------------- Assessment and Plan  Metatarsalgia of both feet Recommend trial of metatarsal pad and provided with medium to large sizes to try.  He can use ibuprofen or Aleve as needed for pain.  We discussed that if not improving to follow-up.   No orders of the defined types were placed in this encounter.   No follow-ups on file.    This visit occurred during the SARS-CoV-2 public health emergency.  Safety protocols were in place, including screening questions prior to the visit, additional usage of staff PPE, and extensive cleaning of exam room while observing appropriate contact time as indicated for disinfecting solutions.

## 2021-07-17 ENCOUNTER — Encounter: Payer: Self-pay | Admitting: Family Medicine

## 2021-07-18 ENCOUNTER — Other Ambulatory Visit: Payer: Self-pay | Admitting: Family Medicine

## 2021-07-18 ENCOUNTER — Other Ambulatory Visit: Payer: Self-pay

## 2021-07-18 ENCOUNTER — Ambulatory Visit (INDEPENDENT_AMBULATORY_CARE_PROVIDER_SITE_OTHER): Payer: BC Managed Care – PPO

## 2021-07-18 DIAGNOSIS — M79671 Pain in right foot: Secondary | ICD-10-CM | POA: Diagnosis not present

## 2021-07-18 DIAGNOSIS — M79672 Pain in left foot: Secondary | ICD-10-CM | POA: Diagnosis not present

## 2021-07-18 NOTE — Telephone Encounter (Signed)
Orders entered.  He can stop by downstairs to have these completed.   Thanks!

## 2021-07-30 ENCOUNTER — Other Ambulatory Visit: Payer: Self-pay

## 2021-07-30 DIAGNOSIS — M79671 Pain in right foot: Secondary | ICD-10-CM

## 2021-07-30 MED ORDER — PREDNISONE 20 MG PO TABS: 20.0000 mg | ORAL_TABLET | Freq: Two times a day (BID) | ORAL | 0 refills | Status: AC

## 2021-08-03 ENCOUNTER — Encounter: Payer: Self-pay | Admitting: Gastroenterology

## 2021-08-03 ENCOUNTER — Ambulatory Visit (INDEPENDENT_AMBULATORY_CARE_PROVIDER_SITE_OTHER): Payer: BC Managed Care – PPO | Admitting: Gastroenterology

## 2021-08-03 VITALS — BP 128/80 | HR 75 | Ht 79.0 in | Wt 229.2 lb

## 2021-08-03 DIAGNOSIS — D128 Benign neoplasm of rectum: Secondary | ICD-10-CM | POA: Diagnosis not present

## 2021-08-03 DIAGNOSIS — K649 Unspecified hemorrhoids: Secondary | ICD-10-CM

## 2021-08-03 DIAGNOSIS — K625 Hemorrhage of anus and rectum: Secondary | ICD-10-CM

## 2021-08-03 DIAGNOSIS — R933 Abnormal findings on diagnostic imaging of other parts of digestive tract: Secondary | ICD-10-CM | POA: Diagnosis not present

## 2021-08-03 DIAGNOSIS — Z86010 Personal history of colonic polyps: Secondary | ICD-10-CM

## 2021-08-03 MED ORDER — SUTAB 1479-225-188 MG PO TABS: 24.0000 | ORAL_TABLET | ORAL | 0 refills | Status: DC

## 2021-08-03 NOTE — Patient Instructions (Signed)
Your provider has requested that you go to the basement level for lab work before leaving today or 1 to 2 weeks prior to Colonoscopy in Oct. Press "B" on the elevator. The lab is located at the first door on the left as you exit the elevator.  We have sent the following medications to your pharmacy for you to pick up at your convenience: Sutab  You have been scheduled for a colonoscopy. Please follow written instructions given to you at your visit today.  Please pick up your prep supplies at the pharmacy within the next 1-3 days. If you use inhalers (even only as needed), please bring them with you on the day of your procedure.    If you are age 2 or younger, your body mass index should be between 19-25. Your Body mass index is 25.83 kg/m. If this is out of the aformentioned range listed, please consider follow up with your Primary Care Provider.   __________________________________________________________  The [facility] providers would like to encourage you to use [facility] to communicate with providers for non-urgent requests or questions.  Due to long hold times on the telephone, sending your provider a message by [facility] may be a faster and more efficient way to get a response.  Please allow 48 business hours for a response.  Please remember that this is for non-urgent requests.   Thank you for choosing me and [facility].  Dr. Rush Landmark

## 2021-08-06 ENCOUNTER — Encounter: Payer: Self-pay | Admitting: Gastroenterology

## 2021-08-06 DIAGNOSIS — Z860101 Personal history of adenomatous and serrated colon polyps: Secondary | ICD-10-CM | POA: Insufficient documentation

## 2021-08-06 DIAGNOSIS — K625 Hemorrhage of anus and rectum: Secondary | ICD-10-CM

## 2021-08-06 DIAGNOSIS — D128 Benign neoplasm of rectum: Secondary | ICD-10-CM | POA: Insufficient documentation

## 2021-08-06 DIAGNOSIS — R933 Abnormal findings on diagnostic imaging of other parts of digestive tract: Secondary | ICD-10-CM

## 2021-08-06 DIAGNOSIS — K649 Unspecified hemorrhoids: Secondary | ICD-10-CM

## 2021-08-06 DIAGNOSIS — Z86010 Personal history of colonic polyps: Secondary | ICD-10-CM

## 2021-08-06 HISTORY — DX: Hemorrhage of anus and rectum: K62.5

## 2021-08-06 HISTORY — DX: Personal history of colonic polyps: Z86.010

## 2021-08-06 HISTORY — DX: Unspecified hemorrhoids: K64.9

## 2021-08-06 HISTORY — DX: Abnormal findings on diagnostic imaging of other parts of digestive tract: R93.3

## 2021-08-06 HISTORY — DX: Benign neoplasm of rectum: D12.8

## 2021-08-06 HISTORY — DX: Personal history of adenomatous and serrated colon polyps: Z86.0101

## 2021-08-06 NOTE — Progress Notes (Signed)
[place] VISIT   Primary Care Provider Luetta Nutting, [address] [place] [address] [phone]   Patient Profile: Travis Li is a 40 y.o. male with a pmh significant for hypertension, migraines, seasonal allergies, colon polyps (TA), hemorrhoids.  The patient presents to the [facility] for an evaluation and management of problem(s) noted below:  Problem List 1. Tubular adenoma of rectum   2. Hx of adenomatous colonic polyps   3. Abnormal colonoscopy   4. Hemorrhoids, unspecified hemorrhoid type   5. Rectal bleeding     History of Present Illness Please see initial consultation note by NP Chester Holstein for full details of HPI.  Interval History The patient and I met for a colonoscopy earlier this year after having issues of rectal bleeding and wanting to ensure that nothing else could be a source for potential bleeding other than hemorrhoids.  A large tubular adenoma was found in the rectum that was removed in piecemeal fashion.  Overall since the patient's colonoscopy has increased his fiber supplementation both diet as well as supplemental and noticed an improvement in his rectal bleeding.  At times however, he was going to significantly/often with a higher fiber supplement.  Thus he maintains his fiber intake mostly with his diet and occasionally with a supplement.  Patient is ready and willing to undergo any follow-up that is required at this time.  He denies any overt melena or hematochezia at this time.  He does not take significant nonsteroidals or BC/Goody powders currently.  GI Review of Systems Positive as above Negative for pyrosis, dysphagia, odynophagia, nausea, vomiting, pain  Review of Systems General: Denies fevers/chills/weight loss Cardiovascular: Denies chest pain Pulmonary: Denies shortness of breath Gastroenterological: See HPI Genitourinary: Denies darkened urine  Hematological:  Denies easy bruising/bleeding Dermatological: Denies jaundice Psychological: Mood is stable   Medications Current Outpatient Medications  Medication Sig Dispense Refill   albuterol (PROVENTIL HFA;VENTOLIN HFA) 108 (90 Base) MCG/ACT inhaler Inhale 2 puffs into the lungs every 6 (six) hours as needed for wheezing or shortness of breath. 1 Inhaler 0   fexofenadine (ALLEGRA) 180 MG tablet Take 180 mg by mouth daily.      Multiple Vitamin (MULTIVITAMIN WITH MINERALS) TABS tablet Take 1 tablet by mouth daily.     Omega-3 Fatty Acids (FISH OIL) 1000 MG CAPS Take 1 capsule by mouth daily.     Sodium Sulfate-Mag Sulfate-KCl (SUTAB) [phone] MG TABS Take 24 tablets by mouth as directed. 24 tablet 0   No current facility-administered medications for this visit.    Allergies Allergies  Allergen Reactions   Minocycline Hives    Lips swell   Lisinopril Other (See Comments)    Fatigue    Histories Past Medical History:  Diagnosis Date   Colon polyp    Dyslipidemia 01/30/2018   Environmental allergies 06/22/2012   Epistaxis 06/14/2020   Essential hypertension 01/12/2019   Exercise-induced asthma 01/30/2018   History of migraine headaches 06/22/2012   Lower abdominal pain 09/12/2020   Migraines    Mitral valve prolapse 09/05/2016   PVC's (premature ventricular contractions) 09/05/2016   Tinea pedis 05/09/2020   Past Surgical History:  Procedure Laterality Date   COLONOSCOPY     WISDOM TOOTH EXTRACTION     Social History   Socioeconomic History   Marital status: Married    Spouse name: Not on file   Number of children: 1   Years of education: Not on file  Highest education level: Not on file  Occupational History   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  Tobacco Use   Smoking status: Never   Smokeless tobacco: Never  Vaping Use   Vaping Use: Never used  Substance and Sexual Activity   Alcohol use: Not Currently    Comment: 2 per week   Drug use: No    Sexual activity: Yes    Partners: Female    Birth control/protection: Condom  Other Topics Concern   Not on file  Social History Narrative   Not on file   Social Determinants of Health   Financial Resource Strain: Not on file  Food Insecurity: Not on file  Transportation Needs: Not on file  Physical Activity: Not on file  Stress: Not on file  Social Connections: Not on file  Intimate Partner Violence: Not on file   Family History  Problem Relation Age of Onset   Hypercholesterolemia Mother    Cancer Mother        Endometrial cancer   Hypertension Mother    Diabetes Father    Colon polyps Father    Irritable bowel syndrome Father    Heart disease Paternal Grandfather    Diabetes Paternal Grandfather        borderline   Colon cancer Neg Hx    Esophageal cancer Neg Hx    Pancreatic cancer Neg Hx    Stomach cancer Neg Hx    Inflammatory bowel disease Neg Hx    Liver disease Neg Hx    Rectal cancer Neg Hx    I have reviewed his medical, social, and family history in detail and updated the electronic medical record as necessary.    PHYSICAL EXAMINATION  BP 128/80 (BP Location: Left Arm, Patient Position: Sitting, Cuff Size: Normal)   Pulse 75   Ht 6' 7"  (2.007 m)   Wt 229 lb 4 oz (104 kg)   SpO2 98%   BMI 25.83 kg/m  Wt Readings from Last 3 Encounters:  08/03/21 229 lb 4 oz (104 kg)  06/27/21 230 lb (104.3 kg)  01/01/21 229 lb 0.6 oz (103.9 kg)  GEN: NAD, appears stated age, doesn't appear chronically ill PSYCH: Cooperative, without pressured speech EYE: Conjunctivae pink, sclerae anicteric ENT: MMM CV: Nontachycardic RESP: No audible wheezing GI: NABS, soft, NT/ND, without rebound or guarding MSK/EXT: No lower extremity edema SKIN: No jaundice NEURO:  Alert & Oriented x 3, no focal deficits   REVIEW OF DATA  I reviewed the following data at the time of this encounter:  GI Procedures and Studies  January 2022 colonoscopy - Perianal skin tags found on  perianal exam. - Hemorrhoids found on digital rectal exam. - The examined portion of the ileum was normal. Biopsied. - One 3 mm polyp at the hepatic flexure, removed with a cold snare. Resected and retrieved. - One 20 mm polyp in the distal rectum, removed with piecemeal cold mucosal resection. Resected and retrieved. One clip (MR conditional) was placed. - Normal mucosa in the entire examined colon. Biopsied. - Non-bleeding non-thrombosed external and internal hemorrhoids.  Pathology Diagnosis 1. Surgical [P], small bowel, terminal ileum - ILEAL MUCOSA WITH NO SIGNIFICANT PATHOLOGIC FINDINGS. - NEGATIVE FOR ACTIVE INFLAMMATION AND OTHER ABNORMALITIES. 2. Surgical [P], random colon sites - COLONIC MUCOSA WITH NO SIGNIFICANT PATHOLOGIC FINDINGS. - NEGATIVE FOR ACTIVE INFLAMMATION AND OTHER ABNORMALITIES. 3. Surgical [P], colon, hepatic flexure, polyp (1) - COLONIC MUCOSA WITH UNDERLYING LYMPHOID AGGREGATE. - NEGATIVE FOR DYSPLASIA. 4. Surgical [P], colon,  rectum, polyp (1) - TUBULAR ADENOMA. - NEGATIVE FOR HIGH GRADE DYSPLASIA.  Laboratory Studies  Reviewed those in epic and care everywhere  Imaging Studies  No relevant studies to review   ASSESSMENT  Mr. Juncaj is a 40 y.o. male with a pmh significant for hypertension, migraines, seasonal allergies, colon polyps (TA), hemorrhoids.  The patient is seen today for evaluation and management of:  1. Tubular adenoma of rectum   2. Hx of adenomatous colonic polyps   3. Abnormal colonoscopy   4. Hemorrhoids, unspecified hemorrhoid type   5. Rectal bleeding    The patient is hemodynamically and clinically stable at this time.  A large advanced adenoma of the rectum was found and removed in piecemeal fashion earlier this year.  Repeat endoscopic evaluation is reasonable and the next step.  Because of the potential risk of recurrence, having other endoscopic therapies available is important.  We discussed some of the techniques of  advanced polypectomy which include Endoscopic Mucosal Resection, OVESCO Full-Thickness Resection, Endorotor Morcellation, and Tissue Ablation via Fulguration.  Hopefully there will not be any recurrence, but if recurrence has developed, the resection may be slightly higher risk.  The risks of bleeding and perforation/leak are increased as opposed to diagnostic and screening procedures, and that was discussed with the patient as well.   All patient questions were answered, to the best of my ability, and the patient agrees to the aforementioned plan of action with follow-up as indicated.   PLAN  Preprocedure labs to be obtained Proceed with scheduling colonoscopy with EMR follow-up in hospital-based setting Continue high-fiber diet for now as it is helped in regards to rectal bleeding Hemorrhoidal disease evaluation in future if bleeding recurs   Orders Placed This Encounter  Procedures   Procedural/ Surgical Case Request: COLONOSCOPY WITH PROPOFOL, ENDOSCOPIC MUCOSAL RESECTION   CBC   Basic Metabolic Panel (BMET)   INR/PT   Ambulatory referral to Gastroenterology    New Prescriptions   SODIUM SULFATE-MAG SULFATE-KCL (SUTAB) [phone] MG TABS    Take 24 tablets by mouth as directed.   Modified Medications   No medications on file    Planned Follow Up No follow-ups on file.   Total Time in Face-to-Face and in Coordination of Care for patient including independent/personal interpretation/review of prior testing, medical history, examination, medication adjustment, communicating results with the patient directly, and documentation with the EHR is 25 minutes.Justice Britain, MD [place] [facility] Advanced [facility]oscopy Office # [phone]

## 2021-08-10 DIAGNOSIS — Z20822 Contact with and (suspected) exposure to covid-19: Secondary | ICD-10-CM | POA: Diagnosis not present

## 2021-08-27 ENCOUNTER — Telehealth: Payer: Self-pay | Admitting: Gastroenterology

## 2021-08-27 NOTE — Telephone Encounter (Signed)
Inbound call from patient requesting a call from a nurse please.  States care partner will not be available 10/04/21 to bring him to his procedure and is wanting to know if 10/03/21 is still available.  Please advise.

## 2021-08-29 NOTE — Telephone Encounter (Signed)
Left message on machine to call back   The pt has been rescheduled to 10/26 at 730 am at [facility].

## 2021-08-30 NOTE — Telephone Encounter (Signed)
I have been unable to reach the pt by phone.  New instructions have been sent to the pt via My Chart. I do see that he checks his messages.

## 2021-08-30 NOTE — Telephone Encounter (Signed)
Left message on machine to call back  

## 2021-09-21 ENCOUNTER — Other Ambulatory Visit (INDEPENDENT_AMBULATORY_CARE_PROVIDER_SITE_OTHER): Payer: BC Managed Care – PPO

## 2021-09-21 DIAGNOSIS — D128 Benign neoplasm of rectum: Secondary | ICD-10-CM | POA: Diagnosis not present

## 2021-09-21 LAB — BASIC METABOLIC PANEL
BUN: 16 mg/dL (ref 6–23)
CO2: 30 mEq/L (ref 19–32)
Calcium: 9.9 mg/dL (ref 8.4–10.5)
Chloride: 101 mEq/L (ref 96–112)
Creatinine, Ser: 1.27 mg/dL (ref 0.40–1.50)
GFR: 70.24 mL/min (ref >60.00)
Glucose, Bld: 96 mg/dL (ref 70–99)
Potassium: 4.3 mEq/L (ref 3.5–5.1)
Sodium: 137 mEq/L (ref 135–145)

## 2021-09-21 LAB — PROTIME-INR
INR: 1 ratio (ref 0.8–1.0)
Prothrombin Time: 11.3 s (ref 9.6–13.1)

## 2021-09-21 LAB — CBC
HCT: 46 % (ref 39.0–52.0)
Hemoglobin: 15.7 g/dL (ref 13.0–17.0)
MCHC: 34.1 g/dL (ref 30.0–36.0)
MCV: 93.3 fl (ref 78.0–100.0)
Platelets: 212 10*3/uL (ref 150.0–400.0)
RBC: 4.93 Mil/uL (ref 4.22–5.81)
RDW: 12.4 % (ref 11.5–15.5)
WBC: 6.4 10*3/uL (ref 4.0–10.5)

## 2021-09-24 ENCOUNTER — Encounter (HOSPITAL_COMMUNITY): Payer: Self-pay | Admitting: Gastroenterology

## 2021-09-25 NOTE — Progress Notes (Signed)
Attempted to obtain medical history via telephone, unable to reach at this time. I left a voicemail to return pre surgical testing department's phone call.  

## 2021-10-03 ENCOUNTER — Ambulatory Visit (HOSPITAL_COMMUNITY): Payer: BC Managed Care – PPO | Admitting: Anesthesiology

## 2021-10-03 ENCOUNTER — Other Ambulatory Visit: Payer: Self-pay

## 2021-10-03 ENCOUNTER — Ambulatory Visit (HOSPITAL_COMMUNITY)
Admission: RE | Admit: 2021-10-03 | Discharge: 2021-10-03 | Disposition: A | Discharge status: Home / Self Care | Payer: BC Managed Care – PPO | Attending: Gastroenterology | Admitting: Gastroenterology

## 2021-10-03 ENCOUNTER — Encounter (HOSPITAL_COMMUNITY): Payer: Self-pay | Admitting: Gastroenterology

## 2021-10-03 ENCOUNTER — Encounter (HOSPITAL_COMMUNITY)
Admission: RE | Disposition: A | Discharge status: Home / Self Care | Payer: Self-pay | Source: Home / Self Care | Attending: Gastroenterology

## 2021-10-03 DIAGNOSIS — K641 Second degree hemorrhoids: Secondary | ICD-10-CM | POA: Diagnosis not present

## 2021-10-03 DIAGNOSIS — K644 Residual hemorrhoidal skin tags: Secondary | ICD-10-CM | POA: Insufficient documentation

## 2021-10-03 DIAGNOSIS — Z888 Allergy status to other drugs, medicaments and biological substances status: Secondary | ICD-10-CM | POA: Diagnosis not present

## 2021-10-03 DIAGNOSIS — K6289 Other specified diseases of anus and rectum: Secondary | ICD-10-CM | POA: Diagnosis not present

## 2021-10-03 DIAGNOSIS — Z9889 Other specified postprocedural states: Secondary | ICD-10-CM | POA: Diagnosis not present

## 2021-10-03 DIAGNOSIS — K621 Rectal polyp: Secondary | ICD-10-CM

## 2021-10-03 DIAGNOSIS — Z86010 Personal history of colonic polyps: Secondary | ICD-10-CM

## 2021-10-03 DIAGNOSIS — D128 Benign neoplasm of rectum: Secondary | ICD-10-CM

## 2021-10-03 DIAGNOSIS — Z881 Allergy status to other antibiotic agents status: Secondary | ICD-10-CM | POA: Diagnosis not present

## 2021-10-03 DIAGNOSIS — I493 Ventricular premature depolarization: Secondary | ICD-10-CM | POA: Diagnosis not present

## 2021-10-03 DIAGNOSIS — Z09 Encounter for follow-up examination after completed treatment for conditions other than malignant neoplasm: Secondary | ICD-10-CM | POA: Diagnosis not present

## 2021-10-03 DIAGNOSIS — I10 Essential (primary) hypertension: Secondary | ICD-10-CM | POA: Diagnosis not present

## 2021-10-03 DIAGNOSIS — E785 Hyperlipidemia, unspecified: Secondary | ICD-10-CM | POA: Diagnosis not present

## 2021-10-03 HISTORY — PX: ENDOSCOPIC MUCOSAL RESECTION: SHX6839

## 2021-10-03 HISTORY — PX: COLONOSCOPY WITH PROPOFOL: SHX5780

## 2021-10-03 HISTORY — PX: BIOPSY: SHX5522

## 2021-10-03 SURGERY — COLONOSCOPY WITH PROPOFOL
Anesthesia: Monitor Anesthesia Care

## 2021-10-03 MED ORDER — LIDOCAINE HCL URETHRAL/MUCOSAL 2 % EX GEL
CUTANEOUS | Status: DC | PRN
  Administered 2021-10-03: 1

## 2021-10-03 MED ORDER — LIDOCAINE HCL URETHRAL/MUCOSAL 2 % EX GEL
CUTANEOUS | Status: AC
  Filled 2021-10-03: qty 30

## 2021-10-03 MED ORDER — PROPOFOL 500 MG/50ML IV EMUL
INTRAVENOUS | Status: DC | PRN
  Administered 2021-10-03: 150 ug/kg/min via INTRAVENOUS

## 2021-10-03 MED ORDER — SODIUM CHLORIDE 0.9 % IV SOLN: INTRAVENOUS | Status: DC

## 2021-10-03 MED ORDER — LIDOCAINE 2% (20 MG/ML) 5 ML SYRINGE
INTRAMUSCULAR | Status: DC | PRN
  Administered 2021-10-03: 100 mg via INTRAVENOUS

## 2021-10-03 MED ORDER — PROPOFOL 10 MG/ML IV BOLUS
INTRAVENOUS | Status: DC | PRN
  Administered 2021-10-03: 30 mg via INTRAVENOUS

## 2021-10-03 MED ORDER — LACTATED RINGERS IV SOLN: INTRAVENOUS | Status: DC | PRN

## 2021-10-03 MED ORDER — PROPOFOL 500 MG/50ML IV EMUL
INTRAVENOUS | Status: AC
  Filled 2021-10-03: qty 50

## 2021-10-03 SURGICAL SUPPLY — 21 items

## 2021-10-03 NOTE — Op Note (Signed)
[facility] Patient Name: Travis Li Procedure Date: 10/03/2021 MRN: [id number] Attending MD: Justice Britain , MD Date of Birth: [birth year] CSN: [id number] Age: 40 Admit Type: Outpatient Procedure:                Colonoscopy Indications:              Surveillance: Personal history of piecemeal removal                            of adenoma on last colonoscopy (less than 1 year                            ago) Providers:                Justice Britain, MD, Grace Isaac, RN, Tyna Jaksch Technician Referring MD:             Dr. Zigmund Daniel, Willia Craze Medicines:                Monitored Anesthesia Care Complications:            No immediate complications. Estimated Blood Loss:     Estimated blood loss was minimal. Procedure:                Pre-Anesthesia Assessment:                           - Prior to the procedure, a History and Physical                            was performed, and patient medications and                            allergies were reviewed. The patient's tolerance of                            previous anesthesia was also reviewed. The risks                            and benefits of the procedure and the sedation                            options and risks were discussed with the patient.                            All questions were answered, and informed consent                            was obtained. Prior Anticoagulants: The patient has                            taken no previous anticoagulant or antiplatelet  agents. ASA Grade Assessment: II - A patient with                            mild systemic disease. After reviewing the risks                            and benefits, the patient was deemed in                            satisfactory condition to undergo the procedure.                           After obtaining informed consent, the colonoscope                             was passed under direct vision. Throughout the                            procedure, the patient's blood pressure, pulse, and                            oxygen saturations were monitored continuously. The                            CF-HQ190L ([id number] Olympus colonoscope was                            introduced through the anus and advanced to the 5                            cm into the ileum. The colonoscopy was performed                            without difficulty. The patient tolerated the                            procedure. The quality of the bowel preparation was                            good. The terminal ileum, ileocecal valve,                            appendiceal orifice, and rectum were photographed. Scope In: 7:57:19 AM Scope Out: 8:16:27 AM Scope Withdrawal Time: 0 hours 15 minutes 35 seconds  Total Procedure Duration: 0 hours 19 minutes 8 seconds  Findings:      The digital rectal exam findings include hemorrhoids. Pertinent       negatives include no palpable rectal lesions.      The terminal ileum and ileocecal valve appeared normal.      Three sessile polyps were found in the rectum. The polyps were 2 to 4 mm       in size. Most likely hyperplastic in origin. These polyps were removed       with a cold snare. Resection and retrieval were complete.  A medium post mucosectomy scar was found in the distal rectum. There was       no evidence of the previous polyp but tissue surrounding the previously       placed endoclip had some mild granulomatous change. Removal of an       endoclip was accomplished with a snare. This scar site was then biopsied       with a cold snare for histology to rule out recurrence.      Normal mucosa was found in the entire colon otherwise.      Non-bleeding non-thrombosed external and internal hemorrhoids were found       during retroflexion, during perianal exam and during digital exam. The       hemorrhoids were Grade II (internal  hemorrhoids that prolapse but reduce       spontaneously). Impression:               - Hemorrhoids found on digital rectal exam.                           - The examined portion of the ileum was normal.                           - Three 2 to 4 mm polyps in the rectum, removed                            with a cold snare - likely hyperplastic. Resected                            and retrieved.                           - Post mucosectomy scar in the distal rectum.                            Endoclip in place with some granulomatous appearing                            tissue underneath. Endoclip removed and area                            biopsied to rule out any recurrence.                           - Normal mucosa in the entire examined colon                            otherwise.                           - Non-bleeding non-thrombosed external and internal                            hemorrhoids. Moderate Sedation:      Not Applicable - Patient had care per Anesthesia. Recommendation:           - The patient will be observed post-procedure,  until all discharge criteria are met.                           - Discharge patient to home.                           - Patient has a contact number available for                            emergencies. The signs and symptoms of potential                            delayed complications were discussed with the                            patient. Return to normal activities tomorrow.                            Written discharge instructions were provided to the                            patient.                           - High fiber diet.                           - No aspirin, ibuprofen, naproxen, or other                            non-steroidal anti-inflammatory drugs for 2 weeks.                           - Continue present medications.                           - Await pathology results.                           -  Monitor for signs/symptoms of bleeding,                            perforation, and infection. If issues please call                            our number to get further assistance as needed.                           - Repeat colonoscopy in 3 years for surveillance                            based on pathology results.                           - The findings and recommendations were discussed  with the patient.                           - The findings and recommendations were discussed                            with the patient's family. Procedure Code(s):        --- Professional ---                           [phone], Colonoscopy, flexible; with removal of                            tumor(s), polyp(s), or other lesion(s) by snare                            technique                           [phone], Colonoscopy, flexible; with removal of                            foreign body(s) Diagnosis Code(s):        --- Professional ---                           K64.1, Second degree hemorrhoids                           K62.1, Rectal polyp                           Z98.890, Other specified postprocedural states                           Z09, Encounter for follow-up examination after                            completed treatment for conditions other than                            malignant neoplasm                           Z86.010, Personal history of colonic polyps CPT copyright 2019 American Medical Association. All rights reserved. The codes documented in this report are preliminary and upon coder review may  be revised to meet current compliance requirements. Justice Britain, MD 10/03/2021 8:41:39 AM Number of Addenda: 0

## 2021-10-03 NOTE — Transfer of Care (Signed)
Immediate Anesthesia Transfer of Care Note  Patient: Travis Li  Procedure(s) Performed: COLONOSCOPY WITH PROPOFOL ENDOSCOPIC MUCOSAL RESECTION BIOPSY  Patient Location: PACU and Endoscopy Unit  Anesthesia Type:MAC  Level of Consciousness: drowsy  Airway & Oxygen Therapy: Patient Spontanous Breathing and Patient connected to face mask oxygen  Post-op Assessment: Report given to RN, Post -op Vital signs reviewed and stable and VSS  - patient not connected to EPIC yet by PACU RN.   BP 710 systolic, sat 626%, Pulse 71.    Post vital signs: Reviewed and stable  Last Vitals:  Vitals Value Taken Time  BP    Temp    Pulse    Resp    SpO2      Last Pain:  Vitals:   10/03/21 0659  TempSrc: Oral  PainSc: 0-No pain         Complications: No notable events documented.

## 2021-10-03 NOTE — Discharge Instructions (Signed)

## 2021-10-03 NOTE — Anesthesia Postprocedure Evaluation (Signed)
Anesthesia Post Note  Patient: Travis Li  Procedure(s) Performed: COLONOSCOPY WITH PROPOFOL ENDOSCOPIC MUCOSAL RESECTION BIOPSY     Patient location during evaluation: Endoscopy Anesthesia Type: MAC Level of consciousness: awake and alert, patient cooperative and oriented Pain management: pain level controlled Vital Signs Assessment: post-procedure vital signs reviewed and stable Respiratory status: nonlabored ventilation, spontaneous breathing and respiratory function stable Cardiovascular status: stable and blood pressure returned to baseline Postop Assessment: no apparent nausea or vomiting Anesthetic complications: no   No notable events documented.  Last Vitals:  Vitals:   10/03/21 0840 10/03/21 0850  BP: 128/79 126/90  Pulse: 65 62  Resp: (!) 6 15  Temp:    SpO2: 100% 100%    Last Pain:  Vitals:   10/03/21 0850  TempSrc:   PainSc: 0-No pain                 Nicholad Kautzman,E. Ricketta Colantonio

## 2021-10-03 NOTE — H&P (Signed)
GASTROENTEROLOGY PROCEDURE H&P NOTE   Primary Care Physician: Luetta Nutting, DO  HPI: Curlee Bogan is a 40 y.o. male who presents for Colonoscopy for follow up of Rectal TA follow up.  Past Medical History:  Diagnosis Date   Colon polyp    Dyslipidemia 01/30/2018   Environmental allergies 06/22/2012   Epistaxis 06/14/2020   Essential hypertension 01/12/2019   Exercise-induced asthma 01/30/2018   History of migraine headaches 06/22/2012   Lower abdominal pain 09/12/2020   Migraines    Mitral valve prolapse 09/05/2016   PVC's (premature ventricular contractions) 09/05/2016   Tinea pedis 05/09/2020   Past Surgical History:  Procedure Laterality Date   COLONOSCOPY     WISDOM TOOTH EXTRACTION     Current Facility-Administered Medications  Medication Dose Route Frequency Provider Last Rate Last Admin   0.9 %  sodium chloride infusion   Intravenous Continuous Mansouraty, Telford Nab., MD        Current Facility-Administered Medications:    0.9 %  sodium chloride infusion, , Intravenous, Continuous, Mansouraty, Telford Nab., MD Allergies  Allergen Reactions   Minocycline Hives    Lips swell   Lisinopril Other (See Comments)    Fatigue   Family History  Problem Relation Age of Onset   Hypercholesterolemia Mother    Cancer Mother        Endometrial cancer   Hypertension Mother    Diabetes Father    Colon polyps Father    Irritable bowel syndrome Father    Heart disease Paternal Grandfather    Diabetes Paternal Grandfather        borderline   Colon cancer Neg Hx    Esophageal cancer Neg Hx    Pancreatic cancer Neg Hx    Stomach cancer Neg Hx    Inflammatory bowel disease Neg Hx    Liver disease Neg Hx    Rectal cancer Neg Hx    Social History   Socioeconomic History   Marital status: Married    Spouse name: Not on file   Number of children: 1   Years of education: Not on file   Highest education level: Not on file  Occupational History   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  Tobacco Use   Smoking status: Never   Smokeless tobacco: Never  Vaping Use   Vaping Use: Never used  Substance and Sexual Activity   Alcohol use: Not Currently    Comment: 2 per week   Drug use: No   Sexual activity: Yes    Partners: Female    Birth control/protection: Condom  Other Topics Concern   Not on file  Social History Narrative   Not on file   Social Determinants of Health   Financial Resource Strain: Not on file  Food Insecurity: Not on file  Transportation Needs: Not on file  Physical Activity: Not on file  Stress: Not on file  Social Connections: Not on file  Intimate Partner Violence: Not on file    Physical Exam: Today's Vitals   10/03/21 0659  BP: (!) 141/95  Pulse: 72  Resp: 18  Temp: 98 F (36.7 C)  TempSrc: Oral  SpO2: 100%  Weight: 101.2 kg  Height: 6\' 7"  (2.007 m)  PainSc: 0-No pain   Body mass index is 25.12 kg/m. GEN: NAD EYE: Sclerae anicteric ENT: MMM CV: Non-tachycardic GI: Soft, NT/ND NEURO:  Alert & Oriented x 3  Lab Results: No results for input(s): WBC, HGB, HCT, PLT in the last 72  hours. BMET No results for input(s): NA, K, CL, CO2, GLUCOSE, BUN, CREATININE, CALCIUM in the last 72 hours. LFT No results for input(s): PROT, ALBUMIN, AST, ALT, ALKPHOS, BILITOT, BILIDIR, IBILI in the last 72 hours. PT/INR No results for input(s): LABPROT, INR in the last 72 hours.   Impression / Plan: This is a 40 y.o.male who presents for Colonoscopy for follow up of Rectal TA follow up.  The risks and benefits of endoscopic evaluation/treatment were discussed with the patient and/or family; these include but are not limited to the risk of perforation, infection, bleeding, missed lesions, lack of diagnosis, severe illness requiring hospitalization, as well as anesthesia and sedation related illnesses.  The patient's history has been reviewed, patient examined, no change in status, and deemed stable for  procedure.  The patient and/or family is agreeable to proceed.    Justice Britain, MD [place] [facility] Advanced Endoscopy Office # [phone]

## 2021-10-03 NOTE — Anesthesia Preprocedure Evaluation (Addendum)
Anesthesia Evaluation  Patient identified by MRN, date of birth, ID band Patient awake    Reviewed: Allergy & Precautions, NPO status , Patient's Chart, lab work & pertinent test results  History of Anesthesia Complications Negative for: history of anesthetic complications  Airway Mallampati: I  TM Distance: >3 FB Neck ROM: Full    Dental   Pulmonary asthma (last inhaler needed 2 months ago) ,    breath sounds clear to auscultation       Cardiovascular hypertension (not on meds presently), + Valvular Problems/Murmurs  Rhythm:Regular Rate:Normal  '20 ECHO: EF 60-65%. The cavity size was normal, mildly increased LV wall thickness. Left ventricular diastolic parameters were normal. No evidence of left  ventricular regional wall motion abnormalities, mild mitral thickening with systolic bowing, no mitral valve prolapse   Neuro/Psych  Headaches,    GI/Hepatic Neg liver ROS, Rectal polyps   Endo/Other  negative endocrine ROS  Renal/GU negative Renal ROS     Musculoskeletal   Abdominal   Peds  Hematology negative hematology ROS (+)   Anesthesia Other Findings   Reproductive/Obstetrics                            Anesthesia Physical Anesthesia Plan  ASA: 2  Anesthesia Plan: MAC   Post-op Pain Management:    Induction:   PONV Risk Score and Plan: 1 and Treatment may vary due to age or medical condition  Airway Management Planned: Natural Airway and Simple Face Mask  Additional Equipment: None  Intra-op Plan:   Post-operative Plan:   Informed Consent: I have reviewed the patients History and Physical, chart, labs and discussed the procedure including the risks, benefits and alternatives for the proposed anesthesia with the patient or authorized representative who has indicated his/her understanding and acceptance.     Dental advisory given  Plan Discussed with: CRNA and  Surgeon  Anesthesia Plan Comments:        Anesthesia Quick Evaluation

## 2021-10-03 NOTE — Anesthesia Procedure Notes (Signed)
Procedure Name: MAC Date/Time: 10/03/2021 7:45 AM Performed by: Lollie Sails, CRNA Pre-anesthesia Checklist: Patient identified, Emergency Drugs available, Suction available, Patient being monitored and Timeout performed Oxygen Delivery Method: Simple face mask Placement Confirmation: positive ETCO2

## 2021-10-04 ENCOUNTER — Encounter: Payer: Self-pay | Admitting: Gastroenterology

## 2021-10-04 LAB — SURGICAL PATHOLOGY

## 2021-10-07 ENCOUNTER — Telehealth: Payer: Self-pay | Admitting: Physician Assistant

## 2021-10-07 DIAGNOSIS — K5900 Constipation, unspecified: Secondary | ICD-10-CM | POA: Diagnosis not present

## 2021-10-07 DIAGNOSIS — R1032 Left lower quadrant pain: Secondary | ICD-10-CM | POA: Diagnosis not present

## 2021-10-07 DIAGNOSIS — R109 Unspecified abdominal pain: Secondary | ICD-10-CM | POA: Diagnosis not present

## 2021-10-07 NOTE — Telephone Encounter (Signed)
Patient's wife called but I spoke with the patient himself.  Had large advanced colon polyp piecemeal resected earlier this year.  On 10/26 had repeat colonoscopy with cold snare removal of 3, 2 to 4 mm sized, rectal polyps.  There was scarring but no recurrence of polyp at the mucosectomy site in distal rectum.  In the area of endoclip, which was placed at the previous colonoscopy, there was granularity.  The clip was removed.  Site was biopsied.  Pathology from the polyps are hyperplastic.  Biopsies from the mucosectomy site show polypoid rectal mucosa with hyperplastic changes, nonspecific inflammation and muscular isolation of lamina propria consistent with mucosal prolapse.  Biopsies do not show any dysplasia or malignancy.  Since the colonoscopy a few days ago patient experiencing significant postprandial pain without nausea or vomiting that occurs about 60 to 90 minutes after meals.  Pain never really subsides.  He is passing gas.  Has seen small amount of blood with the stool and this is slowing down.  Bowel movements and cells are brown.  And chills and does not feel well.  Is in [place] currently for a planned mini vacation.  Advised patient to go to the local ED so that he could have lab work and imaging obtained.  Did not feel he should delay this by driving/returning to [facility].  Fortunately he thought ahead and has a copy of his colonoscopy report with him.  He was understanding of plan and will be going to the local ED.  Azucena Freed PA-C

## 2021-10-08 NOTE — Telephone Encounter (Signed)
SG, Thank you for the update. Not sure why he would be having those upper GI symptoms especially with the way the procedure went.  I had spoken with him on Friday and he had been doing relatively well.  Patty, can you get an update from the patient/patient's wife and see where things are standing? A bit out of proportion especially with the length of time of his procedure and what we ended up doing for him to have upper GI symptoms or continued lower GI bleeding. Thanks. GM

## 2021-10-08 NOTE — Telephone Encounter (Signed)
I spoke with the pt and he tells me he went to [facility] in [place] and had CT.  He states that "nothing bad was seen".  He says he feels better today and is actually out walking.  He has some discomfort but nothing very painful.

## 2021-10-08 NOTE — Telephone Encounter (Signed)
Glad to hear he is better. Not sure why these upper GI symptoms have occurred. He should call us back if there are further issues so that we can understand what may be going on. Thanks. GM

## 2021-10-18 DIAGNOSIS — K635 Polyp of colon: Secondary | ICD-10-CM | POA: Insufficient documentation

## 2021-10-22 ENCOUNTER — Encounter: Payer: Self-pay | Admitting: Cardiology

## 2021-10-22 ENCOUNTER — Ambulatory Visit (INDEPENDENT_AMBULATORY_CARE_PROVIDER_SITE_OTHER): Payer: BC Managed Care – PPO | Admitting: Cardiology

## 2021-10-22 ENCOUNTER — Other Ambulatory Visit: Payer: Self-pay

## 2021-10-22 VITALS — BP 130/74 | HR 66 | Ht >= 80 in | Wt 231.0 lb

## 2021-10-22 DIAGNOSIS — I10 Essential (primary) hypertension: Secondary | ICD-10-CM | POA: Diagnosis not present

## 2021-10-22 DIAGNOSIS — Z1321 Encounter for screening for nutritional disorder: Secondary | ICD-10-CM

## 2021-10-22 DIAGNOSIS — E785 Hyperlipidemia, unspecified: Secondary | ICD-10-CM | POA: Diagnosis not present

## 2021-10-22 DIAGNOSIS — I493 Ventricular premature depolarization: Secondary | ICD-10-CM | POA: Diagnosis not present

## 2021-10-22 NOTE — Progress Notes (Signed)
Cardiology Office Note:    Date:  10/22/2021   ID:  Travis Li, DOB [birth year], MRN [id number]  PCP:  Luetta Nutting, DO  Cardiologist:  Jenean Lindau, MD   Referring MD: Luetta Nutting, DO    ASSESSMENT:    1. PVC's (premature ventricular contractions)   2. Dyslipidemia    PLAN:    In order of problems listed above:  Primary prevention stressed with the patient.  Importance of compliance with diet medication stressed any vocalized understanding.  He walks about 2 miles a day without any problems.  He is happy about it. PVCs: Stable at this time.  Patient has hardly any symptoms.  He is happy about it. Mixed dyslipidemia: Mildly elevated lipids.  We will recheck them today as he is fasting.  He also request complete blood work including vitamin D and hemoglobin A1c and we will do this for him. Patient will be seen in follow-up appointment in 6 months or earlier if the patient has any concerns    Medication Adjustments/Labs and Tests Ordered: Current medicines are reviewed at length with the patient today.  Concerns regarding medicines are outlined above.  No orders of the defined types were placed in this encounter.  No orders of the defined types were placed in this encounter.    No chief complaint on file.    History of Present Illness:    Travis Li is a 40 y.o. male.  Patient has past medical history of mixed dyslipidemia and PVCs.  He denies any significant symptoms at this time.  No chest pain orthopnea or PND.  At the time of my evaluation, the patient is alert awake oriented and in no distress.  He exercises on a regular basis.  Past Medical History:  Diagnosis Date   Abnormal colonoscopy 08/06/2021   Colon polyp    Dyslipidemia 01/30/2018   Environmental allergies 06/22/2012   Epistaxis 06/14/2020   Essential hypertension 01/12/2019   Exercise-induced asthma 01/30/2018   Hemorrhoids 08/06/2021   History of migraine headaches 06/22/2012   Hx of  adenomatous colonic polyps 08/06/2021   Lower abdominal pain 09/12/2020   Metatarsalgia of both feet 06/27/2021   Migraines    Mitral valve prolapse 09/05/2016   PVC's (premature ventricular contractions) 09/05/2016   Rectal bleeding 08/06/2021   Tinea pedis 05/09/2020   Tubular adenoma of rectum 08/06/2021    Past Surgical History:  Procedure Laterality Date   BIOPSY  10/03/2021   Procedure: BIOPSY;  Surgeon: Irving Copas., MD;  Location: [facility];  Service: Gastroenterology;;   COLONOSCOPY     COLONOSCOPY WITH PROPOFOL N/A 10/03/2021   Procedure: COLONOSCOPY WITH PROPOFOL;  Surgeon: Irving Copas., MD;  Location: [facility];  Service: Gastroenterology;  Laterality: N/A;   ENDOSCOPIC MUCOSAL RESECTION N/A 10/03/2021   Procedure: ENDOSCOPIC MUCOSAL RESECTION;  Surgeon: Rush Landmark Telford Nab., MD;  Location: [facility];  Service: Gastroenterology;  Laterality: N/A;   WISDOM TOOTH EXTRACTION      Current Medications: Current Meds  Medication Sig   albuterol (PROVENTIL HFA;VENTOLIN HFA) 108 (90 Base) MCG/ACT inhaler Inhale 2 puffs into the lungs every 6 (six) hours as needed for wheezing or shortness of breath.   fexofenadine (ALLEGRA) 180 MG tablet Take 180 mg by mouth daily.    Multiple Vitamin (MULTIVITAMIN WITH MINERALS) TABS tablet Take 1 tablet by mouth daily.   Omega-3 Fatty Acids (FISH OIL) 1000 MG CAPS Take 1,000 mg by mouth daily.     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  Tobacco Use   Smoking status: Never   Smokeless tobacco: Never  Vaping Use   Vaping Use: Never used  Substance and Sexual Activity   Alcohol use: Not Currently    Comment: 2 per week   Drug use: No    Sexual activity: Yes    Partners: Female    Birth control/protection: Condom  Other Topics Concern   Not on file  Social History Narrative   Not on file   Social Determinants of Health   Financial Resource Strain: Not on file  Food Insecurity: Not on file  Transportation Needs: Not on file  Physical Activity: Not on file  Stress: Not on file  Social Connections: Not on file     Family History: The patient's family history includes Cancer in his mother; Colon polyps in his father; Diabetes in his father and paternal grandfather; Heart disease in his paternal grandfather; Hypercholesterolemia in his mother; Hypertension in his mother; Irritable bowel syndrome in his father. There is no history of Colon cancer, Esophageal cancer, Pancreatic cancer, Stomach cancer, Inflammatory bowel disease, Liver disease, or Rectal cancer.  ROS:   Please see the history of present illness.    All other systems reviewed and are negative.  EKGs/Labs/Other Studies Reviewed:    The following studies were reviewed today: I discussed my findings with the patient at length.   Recent Labs: 09/21/2021: BUN 16; Creatinine, Ser 1.27; Hemoglobin 15.7; Platelets 212.0; Potassium 4.3; Sodium 137  Recent Lipid Panel    Component Value Date/Time   CHOL 189 02/15/2020 0834   TRIG 144 02/15/2020 0834   HDL 43 02/15/2020 0834   CHOLHDL 4.4 02/15/2020 0834   CHOLHDL 3.8 02/22/2019 0807   LDLCALC 120 (H) 02/15/2020 0834   LDLCALC 104 (H) 02/22/2019 0807    Physical Exam:    VS:  BP 130/74   Pulse 66   Ht 6\' 8"  (2.032 m)   Wt 231 lb (104.8 kg)   SpO2 99%   BMI 25.38 kg/m     Wt Readings from Last 3 Encounters:  10/22/21 231 lb (104.8 kg)  10/03/21 223 lb (101.2 kg)  08/03/21 229 lb 4 oz (104 kg)     GEN: Patient is in no acute distress HEENT: Normal NECK: No JVD; No carotid bruits LYMPHATICS: No lymphadenopathy CARDIAC: Hear sounds regular, 2/6 systolic murmur at the apex. RESPIRATORY:   Clear to auscultation without rales, wheezing or rhonchi  ABDOMEN: Soft, non-tender, non-distended MUSCULOSKELETAL:  No edema; No deformity  SKIN: Warm and dry NEUROLOGIC:  Alert and oriented x 3 PSYCHIATRIC:  Normal affect   Signed, Jenean Lindau, MD  10/22/2021 8:57 AM    [place]

## 2021-10-22 NOTE — Patient Instructions (Signed)
Medication Instructions:  Your physician recommends that you continue on your current medications as directed. Please refer to the Current Medication list given to you today.  *If you need a refill on your cardiac medications before your next appointment, please call your pharmacy*   Lab Work: Your physician recommends that you have labs done in the office today. Your test included  basic metabolic panel, complete blood count, TSH, vitamin D, A1C, liver function and lipids.  If you have labs (blood work) drawn today and your tests are completely normal, you will receive your results only by: [place] (if you have MyChart) OR A paper copy in the mail If you have any lab test that is abnormal or we need to change your treatment, we will call you to review the results.   Testing/Procedures: None ordered   Follow-Up: At [facility], you and your health needs are our priority.  As part of our continuing mission to provide you with exceptional heart care, we have created designated Provider Care Teams.  These Care Teams include your primary Cardiologist (physician) and Advanced Practice Providers (APPs -  Physician Assistants and Nurse Practitioners) who all work together to provide you with the care you need, when you need it.  We recommend signing up for the patient portal called "MyChart".  Sign up information is provided on this After Visit Summary.  MyChart is used to connect with patients for Virtual Visits (Telemedicine).  Patients are able to view lab/test results, encounter notes, upcoming appointments, etc.  Non-urgent messages can be sent to your provider as well.   To learn more about what you can do with MyChart, go to [email or web address].    Your next appointment:   6 month(s)  The format for your next appointment:   In Person  Provider:   Jyl Heinz, MD   Other Instructions NA

## 2021-10-23 LAB — CBC WITH DIFFERENTIAL/PLATELET
Basophils Absolute: 0.1 10*3/uL (ref 0.0–0.2)
Basos: 1 %
EOS (ABSOLUTE): 1 10*3/uL — ABNORMAL HIGH (ref 0.0–0.4)
Eos: 15 %
Hematocrit: 48.6 % (ref 37.5–51.0)
Hemoglobin: 16.4 g/dL (ref 13.0–17.7)
Immature Grans (Abs): 0 10*3/uL (ref 0.0–0.1)
Immature Granulocytes: 1 %
Lymphocytes Absolute: 1.7 10*3/uL (ref 0.7–3.1)
Lymphs: 24 %
MCH: 31.7 pg (ref 26.6–33.0)
MCHC: 33.7 g/dL (ref 31.5–35.7)
MCV: 94 fL (ref 79–97)
Monocytes Absolute: 0.4 10*3/uL (ref 0.1–0.9)
Monocytes: 6 %
Neutrophils Absolute: 3.9 10*3/uL (ref 1.4–7.0)
Neutrophils: 53 %
Platelets: 259 10*3/uL (ref 150–450)
RBC: 5.18 x10E6/uL (ref 4.14–5.80)
RDW: 11.8 % (ref 11.6–15.4)
WBC: 7.1 10*3/uL (ref 3.4–10.8)

## 2021-10-23 LAB — LIPID PANEL
Chol/HDL Ratio: 5.4 ratio — ABNORMAL HIGH (ref 0.0–5.0)
Cholesterol, Total: 225 mg/dL — ABNORMAL HIGH (ref 100–199)
HDL: 42 mg/dL (ref >39)
LDL Chol Calc (NIH): 150 mg/dL — ABNORMAL HIGH (ref 0–99)
Triglycerides: 180 mg/dL — ABNORMAL HIGH (ref 0–149)
VLDL Cholesterol Cal: 33 mg/dL (ref 5–40)

## 2021-10-23 LAB — HEPATIC FUNCTION PANEL
ALT: 35 IU/L (ref 0–44)
AST: 22 IU/L (ref 0–40)
Albumin: 4.9 g/dL (ref 4.0–5.0)
Alkaline Phosphatase: 63 IU/L (ref 44–121)
Bilirubin Total: 0.6 mg/dL (ref 0.0–1.2)
Bilirubin, Direct: 0.15 mg/dL (ref 0.00–0.40)
Total Protein: 7 g/dL (ref 6.0–8.5)

## 2021-10-23 LAB — BASIC METABOLIC PANEL
BUN/Creatinine Ratio: 12 (ref 9–20)
BUN: 15 mg/dL (ref 6–24)
CO2: 24 mmol/L (ref 20–29)
Calcium: 10 mg/dL (ref 8.7–10.2)
Chloride: 100 mmol/L (ref 96–106)
Creatinine, Ser: 1.27 mg/dL (ref 0.76–1.27)
Glucose: 80 mg/dL (ref 70–99)
Potassium: 4.8 mmol/L (ref 3.5–5.2)
Sodium: 142 mmol/L (ref 134–144)
eGFR: 73 mL/min/{1.73_m2} (ref >59)

## 2021-10-23 LAB — VITAMIN D 25 HYDROXY (VIT D DEFICIENCY, FRACTURES): Vit D, 25-Hydroxy: 36.3 ng/mL (ref 30.0–100.0)

## 2021-10-23 LAB — HEMOGLOBIN A1C
Est. average glucose Bld gHb Est-mCnc: 108 mg/dL
Hgb A1c MFr Bld: 5.4 % (ref 4.8–5.6)

## 2021-10-23 LAB — TSH: TSH: 2.12 u[IU]/mL (ref 0.450–4.500)

## 2021-10-23 NOTE — Addendum Note (Signed)
Addended by: Truddie Hidden on: 10/23/2021 01:07 PM   Modules accepted: Orders

## 2021-11-26 ENCOUNTER — Inpatient Hospital Stay: Admission: RE | Admit: 2021-11-26 | Payer: Self-pay | Source: Ambulatory Visit

## 2021-12-24 DIAGNOSIS — R7989 Other specified abnormal findings of blood chemistry: Secondary | ICD-10-CM | POA: Diagnosis not present

## 2021-12-24 DIAGNOSIS — Z881 Allergy status to other antibiotic agents status: Secondary | ICD-10-CM | POA: Diagnosis not present

## 2021-12-24 DIAGNOSIS — R079 Chest pain, unspecified: Secondary | ICD-10-CM | POA: Diagnosis not present

## 2021-12-24 DIAGNOSIS — Z79899 Other long term (current) drug therapy: Secondary | ICD-10-CM | POA: Diagnosis not present

## 2021-12-24 DIAGNOSIS — R0789 Other chest pain: Secondary | ICD-10-CM | POA: Diagnosis not present

## 2021-12-24 DIAGNOSIS — Z888 Allergy status to other drugs, medicaments and biological substances status: Secondary | ICD-10-CM | POA: Diagnosis not present

## 2021-12-31 ENCOUNTER — Other Ambulatory Visit: Payer: Self-pay

## 2021-12-31 ENCOUNTER — Ambulatory Visit (INDEPENDENT_AMBULATORY_CARE_PROVIDER_SITE_OTHER)
Admission: RE | Admit: 2021-12-31 | Discharge: 2021-12-31 | Disposition: A | Discharge status: Home / Self Care | Payer: Self-pay | Source: Ambulatory Visit | Attending: Cardiology | Admitting: Cardiology

## 2021-12-31 DIAGNOSIS — E785 Hyperlipidemia, unspecified: Secondary | ICD-10-CM

## 2022-01-11 ENCOUNTER — Telehealth (INDEPENDENT_AMBULATORY_CARE_PROVIDER_SITE_OTHER): Payer: BC Managed Care – PPO | Admitting: Sports Medicine

## 2022-01-11 ENCOUNTER — Other Ambulatory Visit: Payer: Self-pay

## 2022-01-11 DIAGNOSIS — J111 Influenza due to unidentified influenza virus with other respiratory manifestations: Secondary | ICD-10-CM

## 2022-01-11 HISTORY — DX: Influenza due to unidentified influenza virus with other respiratory manifestations: J11.1

## 2022-01-11 MED ORDER — AMOXICILLIN-POT CLAVULANATE 875-125 MG PO TABS: 1.0000 | ORAL_TABLET | Freq: Two times a day (BID) | ORAL | 0 refills | Status: AC

## 2022-01-11 MED ORDER — THERAFLU SEVERE COLD 20-10-500 MG PO PACK: PACK | ORAL | Status: DC

## 2022-01-11 MED ORDER — HYDROCOD POLI-CHLORPHE POLI ER 10-8 MG/5ML PO SUER: 5.0000 mL | Freq: Two times a day (BID) | ORAL | 0 refills | Status: DC | PRN

## 2022-01-11 MED ORDER — OSELTAMIVIR PHOSPHATE 75 MG PO CAPS: 75.0000 mg | ORAL_CAPSULE | Freq: Two times a day (BID) | ORAL | 0 refills | Status: DC

## 2022-01-11 NOTE — Assessment & Plan Note (Signed)
Travis Li is a pleasant 41 year old male with a past few days has had increasing chills, achiness, facial pain and pressure, painful cough, masses muscle fatigue but no measured fevers, he is done for COVID tests that were negative, we will treat this as influenza and likely secondary maxillary sinusitis. Adding treatment dose Tamiflu, Augmentin, Tussionex. Will do over-the-counter TheraFlu daytime and nighttime. On also going to write a note out of work from this Wednesday through next Wednesday. I will also treat his family with prophylactic Tamiflu.

## 2022-01-11 NOTE — Progress Notes (Signed)
° °  Virtual Visit via WebEx/MyChart   I connected with  Travis Li  on 01/11/22 via WebEx/MyChart/Doximity Video and verified that I am speaking with the correct person using two identifiers.   I discussed the limitations, risks, security and privacy concerns of performing an evaluation and management service by WebEx/MyChart/Doximity Video, including the higher likelihood of inaccurate diagnosis and treatment, and the availability of in person appointments.  We also discussed the likely need of an additional face to face encounter for complete and high quality delivery of care.  I also discussed with the patient that there may be a patient responsible charge related to this service. The patient expressed understanding and wishes to proceed.  Provider location is in medical facility. Patient location is at their home, different from provider location. People involved in care of the patient during this telehealth encounter were myself, my nurse/medical assistant, and my front office/scheduling team member.  Review of Systems: No fevers, chills, night sweats, weight loss, chest pain, or shortness of breath.   Objective Findings:    General: Speaking full sentences, no audible heavy breathing.  Sounds alert and appropriately interactive.  Appears well.  Face symmetric.  Extraocular movements intact.  Pupils equal and round.  No nasal flaring or accessory muscle use visualized.  Independent interpretation of tests performed by another provider:   None.  Brief History, Exam, Impression, and Recommendations:    Influenza-like illness Travis Li is a pleasant 41 year old male with a past few days has had increasing chills, achiness, facial pain and pressure, painful cough, masses muscle fatigue but no measured fevers, he is done for COVID tests that were negative, we will treat this as influenza and likely secondary maxillary sinusitis. Adding treatment dose Tamiflu, Augmentin, Tussionex. Will do  over-the-counter TheraFlu daytime and nighttime. On also going to write a note out of work from this Wednesday through next Wednesday. I will also treat his family with prophylactic Tamiflu.  I discussed the above assessment and treatment plan with the patient. The patient was provided an opportunity to ask questions and all were answered. The patient agreed with the plan and demonstrated an understanding of the instructions.   The patient was advised to call back or seek an in-person evaluation if the symptoms worsen or if the condition fails to improve as anticipated.   I provided 30 minutes of face to face and non-face-to-face time during this encounter date, time was needed to gather information, review chart, records, communicate/coordinate with staff remotely, as well as complete documentation.   ___________________________________________ Gwen Her. Dianah Field, M.D., ABFM., CAQSM. Primary Care and [place] Instructor of [employer] [facility]

## 2022-01-20 ENCOUNTER — Other Ambulatory Visit: Payer: Self-pay

## 2022-01-20 ENCOUNTER — Emergency Department (INDEPENDENT_AMBULATORY_CARE_PROVIDER_SITE_OTHER)
Admission: EM | Admit: 2022-01-20 | Discharge: 2022-01-20 | Disposition: A | Discharge status: Home / Self Care | Payer: BC Managed Care – PPO | Source: Home / Self Care

## 2022-01-20 DIAGNOSIS — R059 Cough, unspecified: Secondary | ICD-10-CM

## 2022-01-20 DIAGNOSIS — R0981 Nasal congestion: Secondary | ICD-10-CM | POA: Diagnosis not present

## 2022-01-20 HISTORY — DX: Influenza due to unidentified influenza virus with other respiratory manifestations: J11.1

## 2022-01-20 MED ORDER — METHYLPREDNISOLONE SODIUM SUCC 125 MG IJ SOLR
125.0000 mg | Freq: Once | INTRAMUSCULAR | Status: AC
  Administered 2022-01-20: 125 mg via INTRAMUSCULAR

## 2022-01-20 MED ORDER — PREDNISONE 20 MG PO TABS: ORAL_TABLET | ORAL | 0 refills | Status: DC

## 2022-01-20 MED ORDER — BENZONATATE 200 MG PO CAPS: 200.0000 mg | ORAL_CAPSULE | Freq: Three times a day (TID) | ORAL | 0 refills | Status: AC | PRN

## 2022-01-20 NOTE — ED Provider Notes (Signed)
[facility]    CSN: [id number] Arrival date & time: 01/20/22  1122      History   Chief Complaint Chief Complaint  Patient presents with   head congestion    Head congestion x1 week    HPI Travis Li is a 41 y.o. male.   HPI 41 year old male presents with sinus nasal congestion for 1 week.  Reports recently had sinus infection with influenza 1 week ago.  Patient was evaluated via video visit by his PCP on 01/11/2022 was prescribed Augmentin, Tussionex, and Tamiflu.  Past Medical History:  Diagnosis Date   Abnormal colonoscopy 08/06/2021   Colon polyp    Dyslipidemia 01/30/2018   Environmental allergies 06/22/2012   Epistaxis 06/14/2020   Essential hypertension 01/12/2019   Exercise-induced asthma 01/30/2018   Hemorrhoids 08/06/2021   History of migraine headaches 06/22/2012   Hx of adenomatous colonic polyps 08/06/2021   Influenza    Lower abdominal pain 09/12/2020   Metatarsalgia of both feet 06/27/2021   Migraines    Mitral valve prolapse 09/05/2016   PVC's (premature ventricular contractions) 09/05/2016   Rectal bleeding 08/06/2021   Tinea pedis 05/09/2020   Tubular adenoma of rectum 08/06/2021    Patient Active Problem List   Diagnosis Date Noted   Influenza-like illness 01/11/2022   Colon polyp 10/18/2021   Rectal bleeding 08/06/2021   Hemorrhoids 08/06/2021   Abnormal colonoscopy 08/06/2021   Hx of adenomatous colonic polyps 08/06/2021   Tubular adenoma of rectum 08/06/2021   Metatarsalgia of both feet 06/27/2021   Migraines    Lower abdominal pain 09/12/2020   Epistaxis 06/14/2020   Tinea pedis 05/09/2020   Essential hypertension 01/12/2019   Exercise-induced asthma 01/30/2018   Dyslipidemia 01/30/2018   PVC's (premature ventricular contractions) 09/05/2016   Mitral valve prolapse 09/05/2016   Environmental allergies 06/22/2012   History of migraine headaches 06/22/2012    Past Surgical History:  Procedure Laterality Date    BIOPSY  10/03/2021   Procedure: BIOPSY;  Surgeon: Irving Copas., MD;  Location: [facility];  Service: Gastroenterology;;   COLONOSCOPY     COLONOSCOPY WITH PROPOFOL N/A 10/03/2021   Procedure: COLONOSCOPY WITH PROPOFOL;  Surgeon: Irving Copas., MD;  Location: [facility];  Service: Gastroenterology;  Laterality: N/A;   ENDOSCOPIC MUCOSAL RESECTION N/A 10/03/2021   Procedure: ENDOSCOPIC MUCOSAL RESECTION;  Surgeon: Rush Landmark Telford Nab., MD;  Location: [facility];  Service: Gastroenterology;  Laterality: N/A;   WISDOM TOOTH EXTRACTION         Home Medications    Prior to Admission medications   Medication Sig Start Date End Date Taking? Authorizing Provider  albuterol (PROVENTIL HFA;VENTOLIN HFA) 108 (90 Base) MCG/ACT inhaler Inhale 2 puffs into the lungs every 6 (six) hours as needed for wheezing or shortness of breath. 02/22/19  Yes Gregor Hams, MD  benzonatate (TESSALON) 200 MG capsule Take 1 capsule (200 mg total) by mouth 3 (three) times daily as needed for up to 7 days for cough. 01/20/22 01/27/22 Yes Eliezer Lofts, FNP  fexofenadine (ALLEGRA) 180 MG tablet Take 180 mg by mouth daily.    Yes [provider]  Multiple Vitamin (MULTIVITAMIN WITH MINERALS) TABS tablet Take 1 tablet by mouth daily.   Yes [provider]  Omega-3 Fatty Acids (FISH OIL) 1000 MG CAPS Take 1,000 mg by mouth daily.   Yes [provider]  predniSONE (DELTASONE) 20 MG tablet Take 3 tabs PO daily x 5 days. 01/20/22  Yes Eliezer Lofts, FNP  amoxicillin-clavulanate (  AUGMENTIN) 875-125 MG tablet Take 1 tablet by mouth 2 (two) times daily for 10 days. 01/11/22 01/21/22  Silverio Decamp, MD  chlorpheniramine-HYDROcodone 10-8 MG/5ML Take 5 mLs by mouth every 12 (twelve) hours as needed for cough (cough, will cause drowsiness.). 01/11/22   Silverio Decamp, MD  DM-Phenylephrine-Acetaminophen [facility] SEVERE COLD) 20-10-500 MG PACK Use every 6 hours scheduled  01/11/22   Silverio Decamp, MD  oseltamivir (TAMIFLU) 75 MG capsule Take 1 capsule (75 mg total) by mouth 2 (two) times daily. 01/11/22   Silverio Decamp, MD    Family History Family History  Problem Relation Age of Onset   Hypercholesterolemia Mother    Cancer Mother        Endometrial cancer   Hypertension Mother    Diabetes Father    Colon polyps Father    Irritable bowel syndrome Father    Heart disease Paternal Grandfather    Diabetes Paternal Grandfather        borderline   Colon cancer Neg Hx    Esophageal cancer Neg Hx    Pancreatic cancer Neg Hx    Stomach cancer Neg Hx    Inflammatory bowel disease Neg Hx    Liver disease Neg Hx    Rectal cancer Neg Hx     Social History Social History   Tobacco Use   Smoking status: Never   Smokeless tobacco: Never  Vaping Use   Vaping Use: Never used  Substance Use Topics   Alcohol use: Yes    Comment: 2 per week   Drug use: No     Allergies   Minocycline and Lisinopril   Review of Systems Review of Systems  All other systems reviewed and are negative.   Physical Exam Triage Vital Signs ED Triage Vitals  Enc Vitals Group     BP 01/20/22 1226 123/77     Pulse Rate 01/20/22 1226 65     Resp 01/20/22 1226 18     Temp 01/20/22 1226 98 F (36.7 C)     Temp Source 01/20/22 1226 Oral     SpO2 01/20/22 1226 98 %     Weight 01/20/22 1224 219 lb (99.3 kg)     Height 01/20/22 1224 6\' 7"  (2.007 m)     Head Circumference --      Peak Flow --      Pain Score 01/20/22 1223 7     Pain Loc --      Pain Edu? --      Excl. in [place]? --    No data found.  Updated Vital Signs BP 123/77 (BP Location: Left Arm)    Pulse 65    Temp 98 F (36.7 C) (Oral)    Resp 18    Ht 6\' 7"  (2.007 m)    Wt 219 lb (99.3 kg)    SpO2 98%    BMI 24.67 kg/m      Physical Exam Vitals and nursing note reviewed.  Constitutional:      Appearance: Normal appearance. He is obese.  HENT:     Head: Normocephalic and atraumatic.      Right Ear: Tympanic membrane, ear canal and external ear normal.     Left Ear: Tympanic membrane, ear canal and external ear normal.     Nose:     Right Sinus: Frontal sinus tenderness present.     Left Sinus: Frontal sinus tenderness present.     Mouth/Throat:     Mouth: Mucous membranes  are moist.     Pharynx: Oropharynx is clear.  Eyes:     Extraocular Movements: Extraocular movements intact.     Conjunctiva/sclera: Conjunctivae normal.     Pupils: Pupils are equal, round, and reactive to light.  Cardiovascular:     Rate and Rhythm: Normal rate and regular rhythm.     Pulses: Normal pulses.     Heart sounds: Normal heart sounds.  Pulmonary:     Effort: Pulmonary effort is normal.     Breath sounds: Normal breath sounds. No wheezing, rhonchi or rales.     Comments: Infrequent nonproductive cough noted on exam Musculoskeletal:     Cervical back: Normal range of motion and neck supple.  Skin:    General: Skin is warm and dry.  Neurological:     General: No focal deficit present.     Mental Status: He is alert and oriented to person, place, and time.     UC Treatments / Results  Labs (all labs ordered are listed, but only abnormal results are displayed) Labs Reviewed - No data to display  EKG   Radiology No results found.  Procedures Procedures (including critical care time)  Medications Ordered in UC Medications  methylPREDNISolone sodium succinate (SOLU-MEDROL) 125 mg/2 mL injection 125 mg (125 mg Intramuscular Given 01/20/22 1329)    Initial Impression / Assessment and Plan / UC Course  I have reviewed the triage vital signs and the nursing notes.  Pertinent labs & imaging results that were available during my care of the patient were reviewed by me and considered in my medical decision making (see chart for details).     MDM: 1.  Nasal sinus congestion/sinus pressure-IM Solu-Medrol 125 mg given once in clinic, Rx'd oral prednisone to start tomorrow,  01/21/2022; 2.  Cough-Rx'd [employer] Advised patient to start oral prednisone burst tomorrow, Monday, 01/21/2022.  Advised patient to take medication as directed with food to completion.  Advised patient may take Tessalon Perles daily or as needed for cough.  Encouraged patient to increase daily water intake while taking these medications.  Patient discharged home, hemodynamically stable. Final Clinical Impressions(s) / UC Diagnoses   Final diagnoses:  Nasal sinus congestion  Cough, unspecified type     Discharge Instructions      Advised patient to start oral prednisone burst tomorrow, Monday, 01/21/2022.  Advised patient to take medication as directed with food to completion.  Advised patient may take Tessalon Perles daily or as needed for cough.  Encouraged patient to increase daily water intake while taking these medications.     ED Prescriptions     Medication Sig Dispense Auth. Provider   predniSONE (DELTASONE) 20 MG tablet Take 3 tabs PO daily x 5 days. 15 tablet Eliezer Lofts, FNP   benzonatate (TESSALON) 200 MG capsule Take 1 capsule (200 mg total) by mouth 3 (three) times daily as needed for up to 7 days for cough. 40 capsule Eliezer Lofts, FNP      PDMP not reviewed this encounter.   Eliezer Lofts, [place] 01/20/22 1341

## 2022-01-20 NOTE — ED Triage Notes (Signed)
Pt states that he has some head congestion and cough. X1 week  Pt states that he recently had a sinus infection and Influenza 1 week ago.  Pt states that his symptoms resolved but now he's having symptoms again.   Pt states that he is vaccinated for covid. Pt states that he is vaccinated for Flu.

## 2022-01-20 NOTE — Discharge Instructions (Addendum)
Advised patient to start oral prednisone burst tomorrow, Monday, 01/21/2022.  Advised patient to take medication as directed with food to completion.  Advised patient may take Tessalon Perles daily or as needed for cough.  Encouraged patient to increase daily water intake while taking these medications.

## 2022-01-23 IMAGING — DX DG FOOT COMPLETE 3+V*R*
3 series · 3 of 3 positions shown · non-contrast
Comparison: None

CLINICAL DATA: Bilateral foot pain.

EXAM:
LEFT FOOT - COMPLETE 3+ VIEW; RIGHT FOOT COMPLETE - 3+ VIEW

[foot ap]
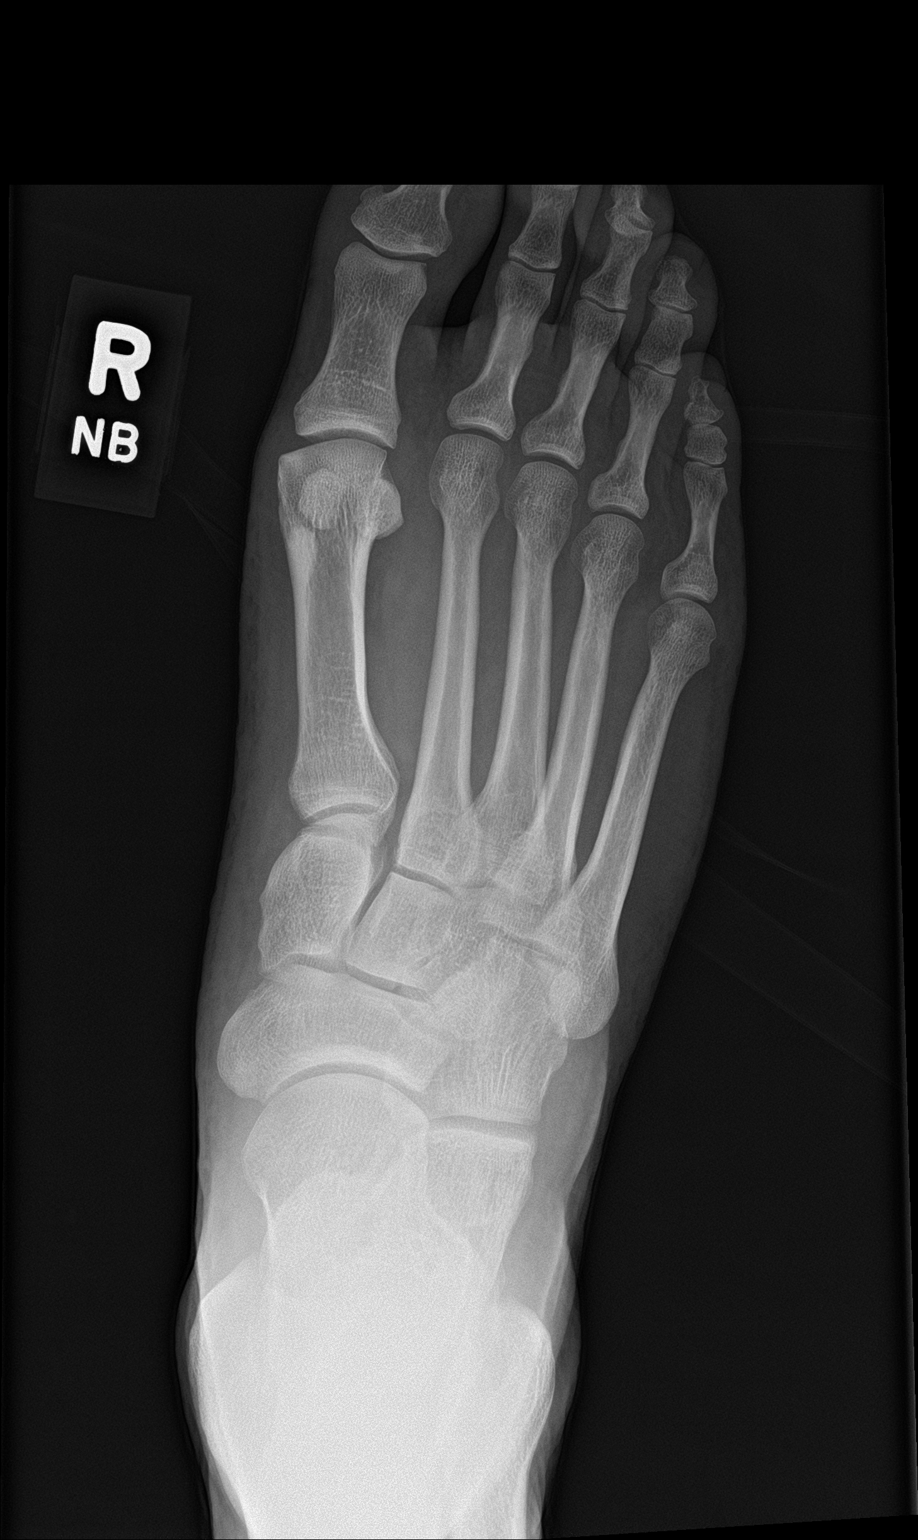

[foot obl]
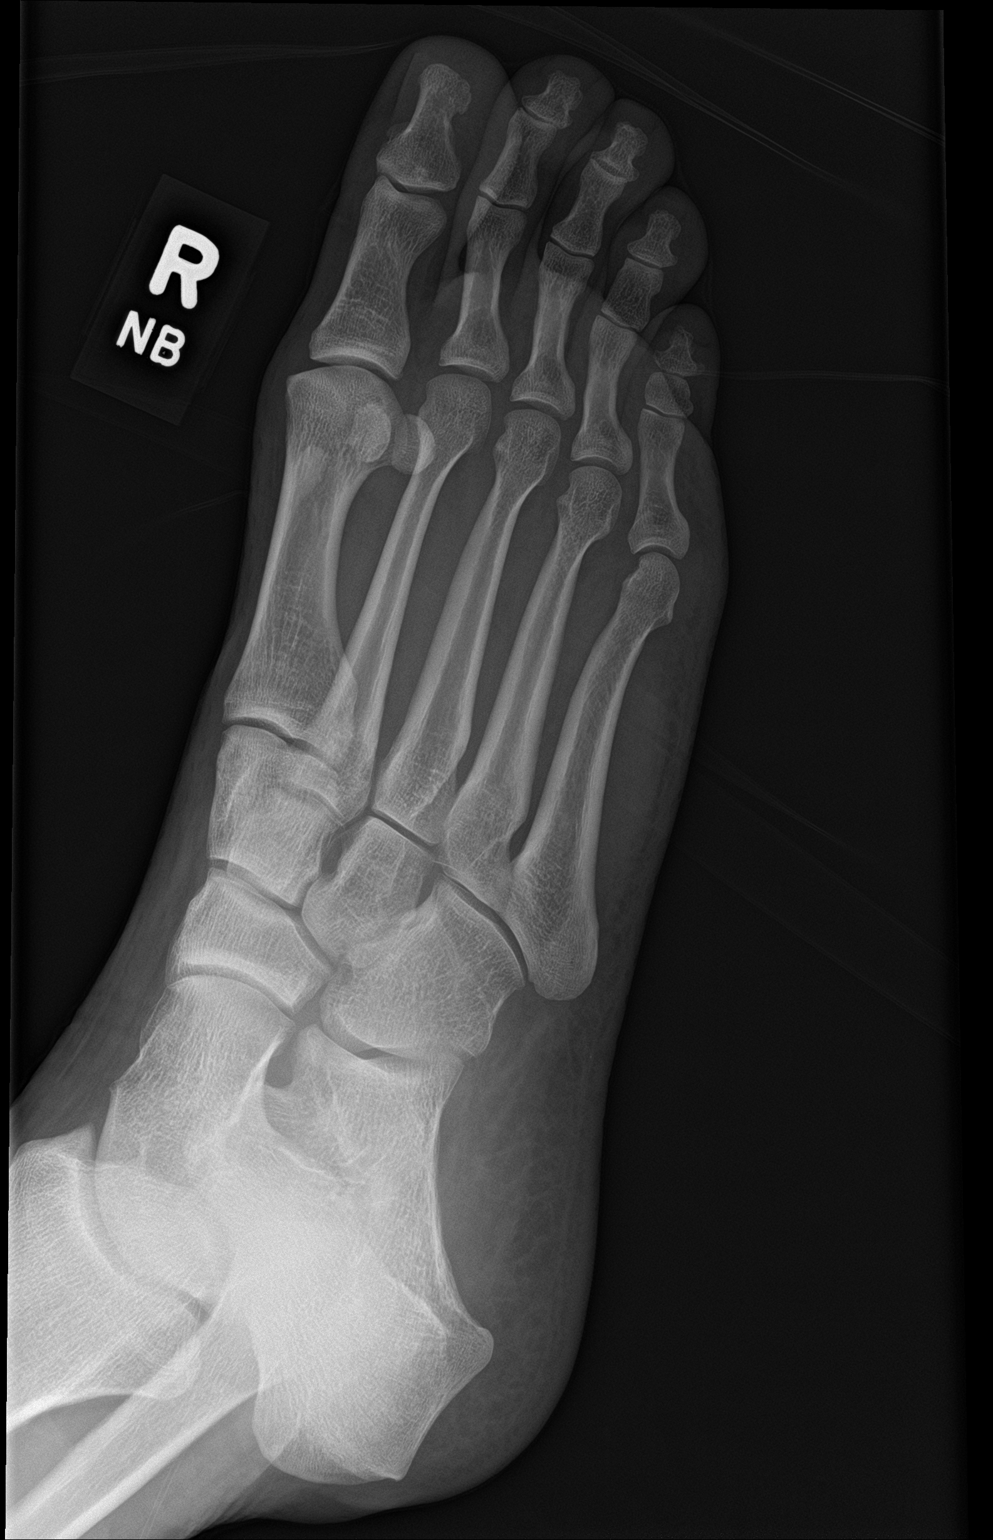

[foot lat]
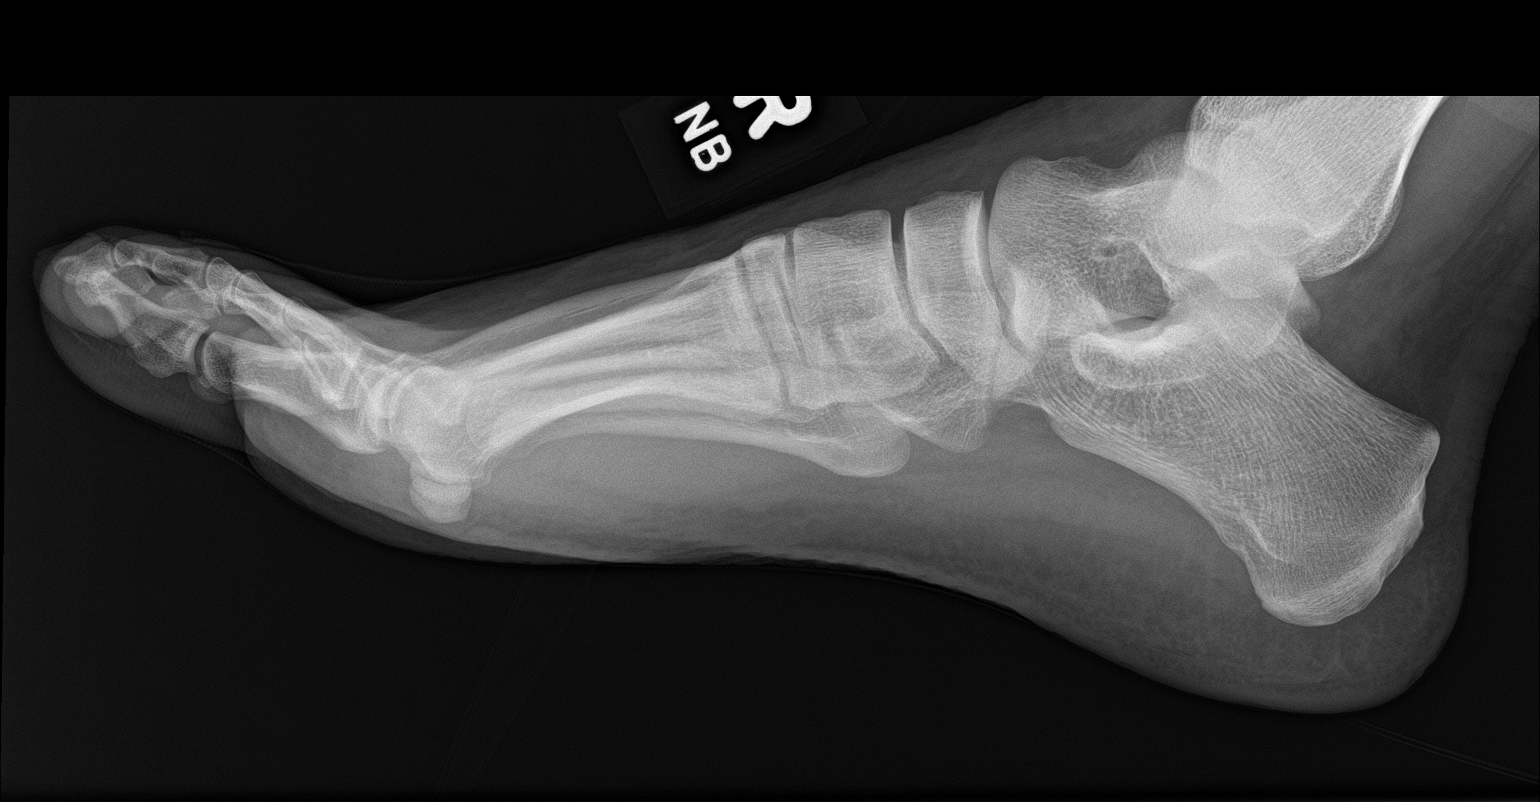

[3 of 3 positions shown; findings below may reference images not displayed]

FINDINGS: There is no evidence of fracture or dislocation. There is no
evidence of arthropathy or other focal bone abnormality. Soft
tissues are unremarkable.
IMPRESSION: Negative.

## 2022-01-23 IMAGING — DX DG FOOT COMPLETE 3+V*L*
3 series · 3 of 3 positions shown · non-contrast
Comparison: None

CLINICAL DATA: Bilateral foot pain.

EXAM:
LEFT FOOT - COMPLETE 3+ VIEW; RIGHT FOOT COMPLETE - 3+ VIEW

[foot ap]
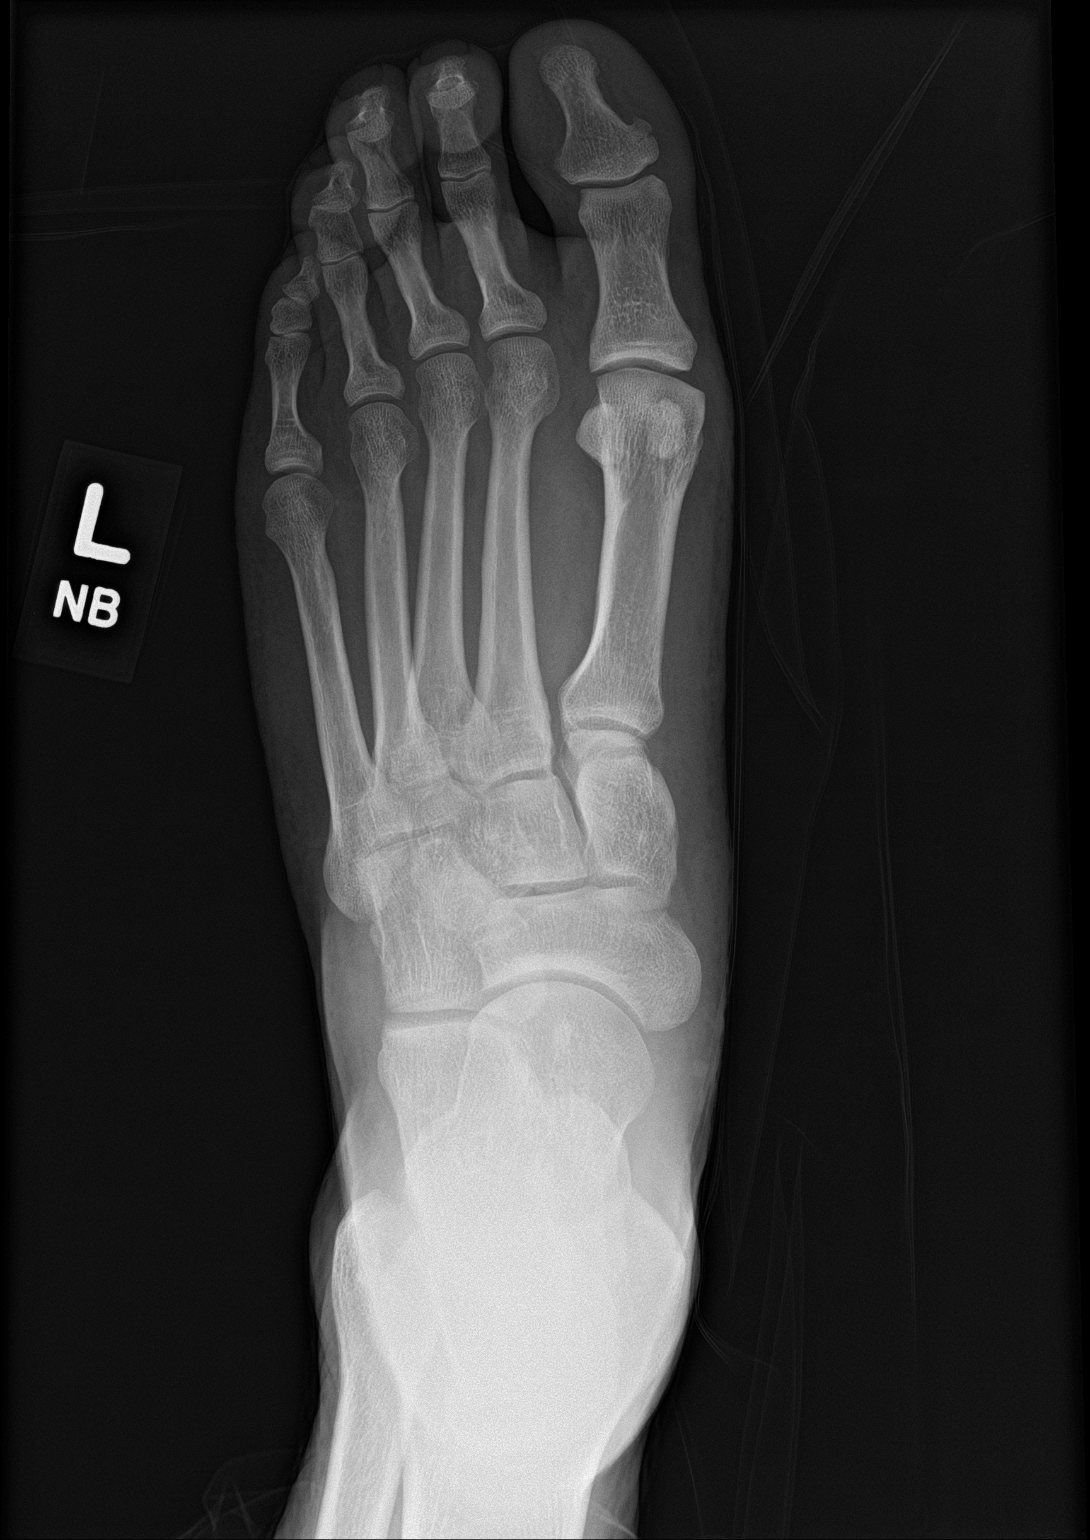

[foot obl]
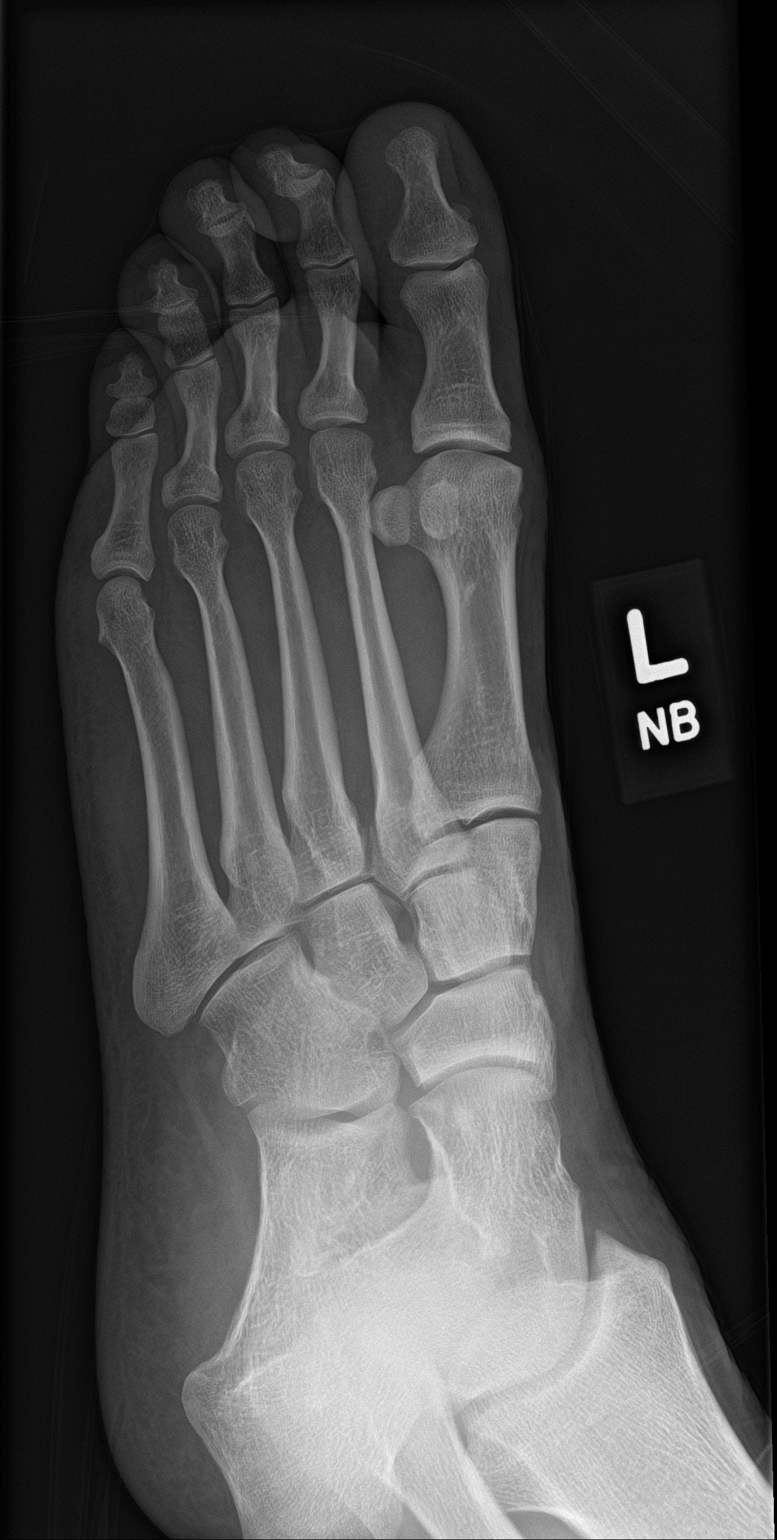

[foot lat]
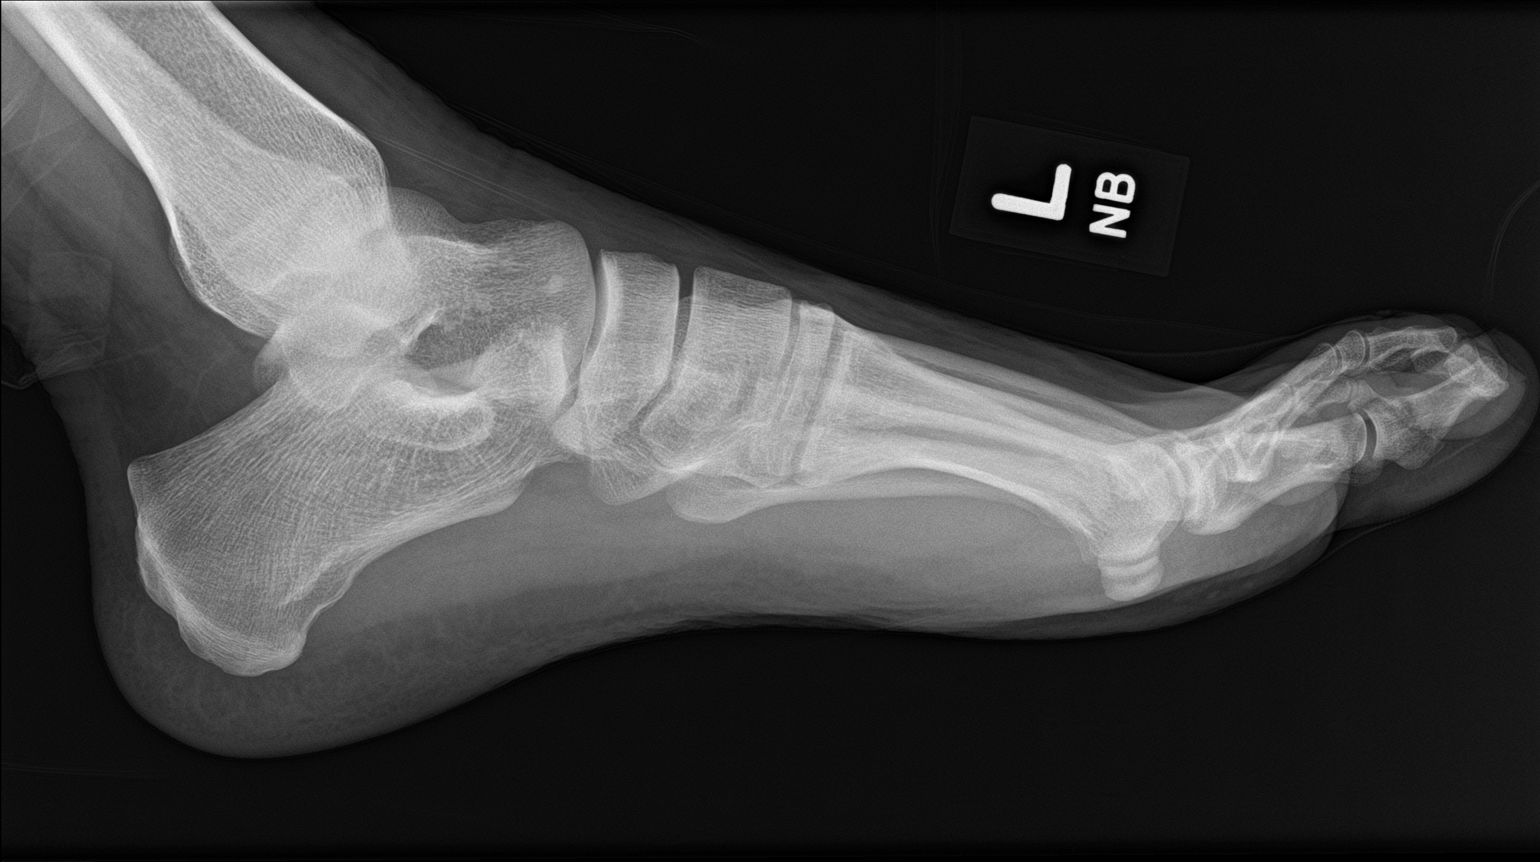

[3 of 3 positions shown; findings below may reference images not displayed]

FINDINGS: There is no evidence of fracture or dislocation. There is no
evidence of arthropathy or other focal bone abnormality. Soft
tissues are unremarkable.
IMPRESSION: Negative.

## 2022-03-07 ENCOUNTER — Emergency Department (INDEPENDENT_AMBULATORY_CARE_PROVIDER_SITE_OTHER): Payer: BC Managed Care – PPO

## 2022-03-07 ENCOUNTER — Emergency Department (INDEPENDENT_AMBULATORY_CARE_PROVIDER_SITE_OTHER)
Admission: RE | Admit: 2022-03-07 | Discharge: 2022-03-07 | Disposition: A | Discharge status: Home / Self Care | Payer: BC Managed Care – PPO | Source: Ambulatory Visit

## 2022-03-07 VITALS — BP 135/84 | HR 82 | Temp 97.7°F | Resp 20 | Ht 79.0 in | Wt 220.0 lb

## 2022-03-07 DIAGNOSIS — J4521 Mild intermittent asthma with (acute) exacerbation: Secondary | ICD-10-CM

## 2022-03-07 DIAGNOSIS — R059 Cough, unspecified: Secondary | ICD-10-CM

## 2022-03-07 MED ORDER — QVAR REDIHALER 80 MCG/ACT IN AERB: 1.0000 | INHALATION_SPRAY | Freq: Two times a day (BID) | RESPIRATORY_TRACT | 0 refills | Status: DC

## 2022-03-07 MED ORDER — METHYLPREDNISOLONE 4 MG PO TBPK: ORAL_TABLET | ORAL | 0 refills | Status: DC

## 2022-03-07 MED ORDER — METHYLPREDNISOLONE SODIUM SUCC 125 MG IJ SOLR
125.0000 mg | Freq: Once | INTRAMUSCULAR | Status: AC
  Administered 2022-03-07: 125 mg via INTRAMUSCULAR

## 2022-03-07 NOTE — ED Triage Notes (Signed)
Pt presents to Urgent Care with c/o cough (primarily dry) x approx 2 weeks. Has asthma and inhaler is not helping w/ cough.  ?

## 2022-03-07 NOTE — Discharge Instructions (Addendum)
Informed patient of chest x-ray results this afternoon with hard copy provided to patient.  Advised patient to take medication as directed with food to completion.  Instructed patient to start [place] tomorrow morning, Friday, 03/08/2022.  Encouraged patient to increase daily water intake while taking this medication.  Advised patient if symptoms worsen and/or unresolved please follow-up with PCP or here for further evaluation. ?

## 2022-03-07 NOTE — ED Provider Notes (Signed)
?[facility] ? ? ? ?CSN: [id number] ?Arrival date & time: 03/07/22  1131 ? ? ?  ? ?History   ?Chief Complaint ?Chief Complaint  ?Patient presents with  ? Cough  ? ? ?HPI ?Travis Li is a 41 y.o. male.  ? ?HPI 41 year old male presents with cough for 2 weeks.  PMH significant for exercise-induced asthma, PVCs, HTN. ? ?Past Medical History:  ?Diagnosis Date  ? Abnormal colonoscopy 08/06/2021  ? Colon polyp   ? Dyslipidemia 01/30/2018  ? Environmental allergies 06/22/2012  ? Epistaxis 06/14/2020  ? Essential hypertension 01/12/2019  ? Exercise-induced asthma 01/30/2018  ? Hemorrhoids 08/06/2021  ? History of migraine headaches 06/22/2012  ? Hx of adenomatous colonic polyps 08/06/2021  ? Influenza   ? Lower abdominal pain 09/12/2020  ? Metatarsalgia of both feet 06/27/2021  ? Migraines   ? Mitral valve prolapse 09/05/2016  ? PVC's (premature ventricular contractions) 09/05/2016  ? Rectal bleeding 08/06/2021  ? Tinea pedis 05/09/2020  ? Tubular adenoma of rectum 08/06/2021  ? ? ?Patient Active Problem List  ? Diagnosis Date Noted  ? Influenza-like illness 01/11/2022  ? Colon polyp 10/18/2021  ? Rectal bleeding 08/06/2021  ? Hemorrhoids 08/06/2021  ? Abnormal colonoscopy 08/06/2021  ? Hx of adenomatous colonic polyps 08/06/2021  ? Tubular adenoma of rectum 08/06/2021  ? Metatarsalgia of both feet 06/27/2021  ? Migraines   ? Lower abdominal pain 09/12/2020  ? Epistaxis 06/14/2020  ? Tinea pedis 05/09/2020  ? Essential hypertension 01/12/2019  ? Exercise-induced asthma 01/30/2018  ? Dyslipidemia 01/30/2018  ? PVC's (premature ventricular contractions) 09/05/2016  ? Mitral valve prolapse 09/05/2016  ? Environmental allergies 06/22/2012  ? History of migraine headaches 06/22/2012  ? ? ?Past Surgical History:  ?Procedure Laterality Date  ? BIOPSY  10/03/2021  ? Procedure: BIOPSY;  Surgeon: Irving Copas., MD;  Location: [facility];  Service: Gastroenterology;;  ? COLONOSCOPY    ? COLONOSCOPY WITH  PROPOFOL N/A 10/03/2021  ? Procedure: COLONOSCOPY WITH PROPOFOL;  Surgeon: Mansouraty, Telford Nab., MD;  Location: [facility];  Service: Gastroenterology;  Laterality: N/A;  ? ENDOSCOPIC MUCOSAL RESECTION N/A 10/03/2021  ? Procedure: ENDOSCOPIC MUCOSAL RESECTION;  Surgeon: Rush Landmark Telford Nab., MD;  Location: [facility];  Service: Gastroenterology;  Laterality: N/A;  ? WISDOM TOOTH EXTRACTION    ? ? ? ? ? ?Home Medications   ? ?Prior to Admission medications   ?Medication Sig Start Date End Date Taking? Authorizing Provider  ?beclomethasone (QVAR REDIHALER) 80 MCG/ACT inhaler Inhale 1 puff into the lungs 2 (two) times daily. 03/07/22 04/06/22 Yes Eliezer Lofts, FNP  ?benzonatate (TESSALON) 100 MG capsule Take 100 mg by mouth 3 (three) times daily as needed for cough.   Yes [provider]  ?methylPREDNISolone (MEDROL DOSEPAK) 4 MG TBPK tablet Take as directed 03/07/22  Yes Eliezer Lofts, FNP  ?albuterol (PROVENTIL HFA;VENTOLIN HFA) 108 (90 Base) MCG/ACT inhaler Inhale 2 puffs into the lungs every 6 (six) hours as needed for wheezing or shortness of breath. 02/22/19   Gregor Hams, MD  ?chlorpheniramine-HYDROcodone 10-8 MG/5ML Take 5 mLs by mouth every 12 (twelve) hours as needed for cough (cough, will cause drowsiness.). 01/11/22   Silverio Decamp, MD  ?DM-Phenylephrine-Acetaminophen [facility] SEVERE COLD) 20-10-500 MG PACK Use every 6 hours scheduled 01/11/22   Silverio Decamp, MD  ?fexofenadine (ALLEGRA) 180 MG tablet Take 180 mg by mouth daily.     [provider]  ?Multiple Vitamin (MULTIVITAMIN WITH MINERALS) TABS tablet Take 1 tablet by mouth daily.  [provider]  ?Omega-3 Fatty Acids (FISH OIL) 1000 MG CAPS Take 1,000 mg by mouth daily.    [provider]  ?oseltamivir (TAMIFLU) 75 MG capsule Take 1 capsule (75 mg total) by mouth 2 (two) times daily. 01/11/22   Silverio Decamp, MD  ?predniSONE (DELTASONE) 20 MG tablet Take 3 tabs PO daily x 5 days.  01/20/22   Eliezer Lofts, FNP  ? ? ?Family History ?Family History  ?Problem Relation Age of Onset  ? Hypercholesterolemia Mother   ? Cancer Mother   ?     Endometrial cancer  ? Hypertension Mother   ? Diabetes Father   ? Colon polyps Father   ? Irritable bowel syndrome Father   ? Heart disease Paternal Grandfather   ? Diabetes Paternal Grandfather   ?     borderline  ? Colon cancer Neg Hx   ? Esophageal cancer Neg Hx   ? Pancreatic cancer Neg Hx   ? Stomach cancer Neg Hx   ? Inflammatory bowel disease Neg Hx   ? Liver disease Neg Hx   ? Rectal cancer Neg Hx   ? ? ?Social History ?Social History  ? ?Tobacco Use  ? Smoking status: Never  ? Smokeless tobacco: Never  ?Vaping Use  ? Vaping Use: Never used  ?Substance Use Topics  ? Alcohol use: Yes  ?  Comment: 2 per week  ? Drug use: No  ? ? ? ?Allergies   ?Minocycline and Lisinopril ? ? ?Review of Systems ?Review of Systems  ?All other systems reviewed and are negative. ? ? ?Physical Exam ?Triage Vital Signs ?ED Triage Vitals  ?Enc Vitals Group  ?   BP 03/07/22 1144 135/84  ?   Pulse Rate 03/07/22 1144 82  ?   Resp 03/07/22 1144 20  ?   Temp 03/07/22 1144 97.7 ?F (36.5 ?C)  ?   Temp Source 03/07/22 1144 Oral  ?   SpO2 03/07/22 1144 97 %  ?   Weight 03/07/22 1140 220 lb (99.8 kg)  ?   Height 03/07/22 1140 '6\' 7"'$  (2.007 m)  ?   Head Circumference --   ?   Peak Flow --   ?   Pain Score 03/07/22 1140 0  ?   Pain Loc --   ?   Pain Edu? --   ?   Excl. in [place]? --   ? ?No data found. ? ?Updated Vital Signs ?BP 135/84 (BP Location: Right Arm)   Pulse 82   Temp 97.7 ?F (36.5 ?C) (Oral)   Resp 20   Ht '6\' 7"'$  (2.007 m)   Wt 220 lb (99.8 kg)   SpO2 97%   BMI 24.78 kg/m?  ? ? ?Physical Exam ?Vitals and nursing note reviewed.  ?Constitutional:   ?   General: He is not in acute distress. ?   Appearance: Normal appearance. He is normal weight. He is not ill-appearing.  ?HENT:  ?   Head: Normocephalic and atraumatic.  ?   Right Ear: Tympanic membrane, ear canal and external ear  normal.  ?   Left Ear: Tympanic membrane, ear canal and external ear normal.  ?   Mouth/Throat:  ?   Mouth: Mucous membranes are moist.  ?   Pharynx: Oropharynx is clear.  ?Eyes:  ?   Extraocular Movements: Extraocular movements intact.  ?   Conjunctiva/sclera: Conjunctivae normal.  ?   Pupils: Pupils are equal, round, and reactive to light.  ?Cardiovascular:  ?  Rate and Rhythm: Normal rate and regular rhythm.  ?   Pulses: Normal pulses.  ?   Heart sounds: Normal heart sounds.  ?Pulmonary:  ?   Effort: Pulmonary effort is normal.  ?   Breath sounds: Normal breath sounds. No wheezing, rhonchi or rales.  ?   Comments: Frequent nonproductive cough noted on exam ?Musculoskeletal:  ?   Cervical back: Normal range of motion and neck supple.  ?Skin: ?   General: Skin is warm and dry.  ?Neurological:  ?   General: No focal deficit present.  ?   Mental Status: He is alert and oriented to person, place, and time.  ? ? ? ?UC Treatments / Results  ?Labs ?(all labs ordered are listed, but only abnormal results are displayed) ?Labs Reviewed - No data to display ? ?EKG ? ? ?Radiology ?DG Chest 2 View ? ?Result Date: 03/07/2022 ?CLINICAL DATA:  Cough. EXAM: CHEST - 2 VIEW COMPARISON:  May 21, 2009. FINDINGS: The heart size and mediastinal contours are within normal limits. Both lungs are clear. The visualized skeletal structures are unremarkable. IMPRESSION: No active cardiopulmonary disease. Electronically Signed   By: Marijo Conception M.D.   On: 03/07/2022 12:38   ? ?Procedures ?Procedures (including critical care time) ? ?Medications Ordered in UC ?Medications  ?methylPREDNISolone sodium succinate (SOLU-MEDROL) 125 mg/2 mL injection 125 mg (125 mg Intramuscular Given 03/07/22 1325)  ? ? ?Initial Impression / Assessment and Plan / UC Course  ?I have reviewed the triage vital signs and the nursing notes. ? ?Pertinent labs & imaging results that were available during my care of the patient were reviewed by me and considered in my  medical decision making (see chart for details). ? ?  ? ?MDM: 1.  Cough, unspecified type-CXR revealed no active cardiopulmonary disease; 2.  Mild intermittent asthma with acute exacerbation-IM Solu-Medrol 125 mg give

## 2022-03-25 NOTE — Progress Notes (Signed)
?  HPI with pertinent ROS:  ? ?CC: UC follow up ? ?HPI: ?Pleasant 41 year old male presenting today for follow up after being seen at Urgent Care on 03/07/2022 for cough x 2 weeks. He was diagnosed with acute asthma exacerbation and treated with an injection of SoluMedrol followed by oral steroids and the addition of a Qvar inhaler. His chest x-ray at that time was normal. Has been traveling ([place], [place]) for work and has a trip to [place] in the next couple of weeks.  Able to inhale ok but exhaling is when the cough is worse. Stopped taking allergy medication at [facility] recommendation since he has been on the Allegra for years but did not restart an allergy medication. Dayquil helps get through the day. Tessalon perls help at night. QVar inhaler made no difference with twice daily dosing. Cough is productive sometimes in the morning with thick greenish-yellow mucus.  Does have some subjective shortness of breath however no chest pain outside of musculoskeletal discomfort from coughing for the last month.  No fevers, chills, or GI symptoms. ? ?I reviewed the past medical history, family history, social history, surgical history, and allergies today and no changes were needed.  Please see the problem list section below in epic for further details. ? ?Physical exam:  ? ?General: Well Developed, well nourished, and in no acute distress.  ?Neuro: Alert and oriented x3.  ?HEENT: Normocephalic, atraumatic.  ?Skin: Warm and dry. ?Cardiac: Regular rate and rhythm, no murmurs rubs or gallops, no lower extremity edema.  ?Respiratory: Clear to auscultation bilaterally. Not using accessory muscles, speaking in full sentences.  Intractable nonproductive cough. ? ?Impression and Recommendations:   ? ?1. 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  He has not responded to treatment provided by urgent care including oral steroids, inhaled corticosteroid, and Tessalon Perles.  Continues to have  severe symptoms that are interfering with his daily activities and making him miserable.  Recommend starting Xyzal 5 mg nightly.  We will go ahead and do a 12-day taper of prednisone to see if this helps relieve inflammation.  Adding Z-Pak for bacterial coverage.  Stop Qvar since this has been unhelpful.  Sending in Trelegy once daily. ? ?Return if symptoms worsen or fail to improve. ?___________________________________________ ?Clearnce Sorrel, DNP, APRN, FNP-BC ?Primary Care and Sports Medicine ?[facility] ?

## 2022-03-26 ENCOUNTER — Encounter: Payer: Self-pay | Admitting: Medical-Surgical

## 2022-03-26 ENCOUNTER — Ambulatory Visit (INDEPENDENT_AMBULATORY_CARE_PROVIDER_SITE_OTHER): Payer: BC Managed Care – PPO | Admitting: Medical-Surgical

## 2022-03-26 VITALS — BP 133/82 | HR 68 | Resp 20 | Ht 79.0 in | Wt 226.8 lb

## 2022-03-26 DIAGNOSIS — J4531 Mild persistent asthma with (acute) exacerbation: Secondary | ICD-10-CM | POA: Diagnosis not present

## 2022-03-26 MED ORDER — TRELEGY ELLIPTA 100-62.5-25 MCG/ACT IN AEPB: 1.0000 | INHALATION_SPRAY | Freq: Every day | RESPIRATORY_TRACT | 1 refills | Status: DC

## 2022-03-26 MED ORDER — AZITHROMYCIN 250 MG PO TABS: ORAL_TABLET | ORAL | 0 refills | Status: AC

## 2022-03-26 MED ORDER — LEVOCETIRIZINE DIHYDROCHLORIDE 5 MG PO TABS: 5.0000 mg | ORAL_TABLET | Freq: Every evening | ORAL | 1 refills | Status: DC

## 2022-03-26 MED ORDER — PREDNISONE 10 MG (48) PO TBPK: ORAL_TABLET | Freq: Every day | ORAL | 0 refills | Status: DC

## 2022-09-16 ENCOUNTER — Encounter: Payer: Self-pay | Admitting: Family Medicine

## 2022-09-16 NOTE — Telephone Encounter (Signed)
Can schedule with Dr. Darene Lamer or see podiatry downstairs.   CM

## 2022-09-26 ENCOUNTER — Ambulatory Visit (INDEPENDENT_AMBULATORY_CARE_PROVIDER_SITE_OTHER): Payer: BC Managed Care – PPO | Admitting: Sports Medicine

## 2022-09-26 ENCOUNTER — Encounter: Payer: Self-pay | Admitting: Sports Medicine

## 2022-09-26 DIAGNOSIS — M7742 Metatarsalgia, left foot: Secondary | ICD-10-CM | POA: Diagnosis not present

## 2022-09-26 DIAGNOSIS — M7741 Metatarsalgia, right foot: Secondary | ICD-10-CM | POA: Diagnosis not present

## 2022-09-26 NOTE — Progress Notes (Signed)
    Procedures performed today:    None.  Independent interpretation of notes and tests performed by another provider:   None.  Brief History, Exam, Impression, and Recommendations:    Metatarsalgia of both feet 1 year of pain plantar aspect both feet metatarsal heads, he does have mild drop of the transverse arch. He did worsen metatarsal pads but they were little bit too far forward previously, we placed him in the right position just behind the metatarsal heads and they seem to feel good, I gave him a couple of extra sets and he can return to see me in 6 weeks.    ____________________________________________ Gwen Her. Dianah Field, M.D., ABFM., CAQSM., AME. Primary Care and Sports Medicine [facility]  Adjunct Professor of [place] [employer]  Risk manager

## 2022-09-26 NOTE — Assessment & Plan Note (Signed)
1 year of pain plantar aspect both feet metatarsal heads, he does have mild drop of the transverse arch. He did worsen metatarsal pads but they were little bit too far forward previously, we placed him in the right position just behind the metatarsal heads and they seem to feel good, I gave him a couple of extra sets and he can return to see me in 6 weeks.

## 2022-10-04 ENCOUNTER — Other Ambulatory Visit: Payer: Self-pay

## 2022-10-04 DIAGNOSIS — J111 Influenza due to unidentified influenza virus with other respiratory manifestations: Secondary | ICD-10-CM | POA: Insufficient documentation

## 2022-10-17 ENCOUNTER — Encounter: Payer: Self-pay | Admitting: Cardiology

## 2022-10-17 ENCOUNTER — Ambulatory Visit: Payer: BC Managed Care – PPO | Attending: Cardiology | Admitting: Cardiology

## 2022-10-17 VITALS — BP 126/74 | HR 84 | Ht >= 80 in | Wt 227.1 lb

## 2022-10-17 DIAGNOSIS — E559 Vitamin D deficiency, unspecified: Secondary | ICD-10-CM | POA: Diagnosis not present

## 2022-10-17 DIAGNOSIS — I493 Ventricular premature depolarization: Secondary | ICD-10-CM | POA: Diagnosis not present

## 2022-10-17 DIAGNOSIS — E785 Hyperlipidemia, unspecified: Secondary | ICD-10-CM

## 2022-10-17 NOTE — Patient Instructions (Signed)
Medication Instructions:  Your physician recommends that you continue on your current medications as directed. Please refer to the Current Medication list given to you today.  *If you need a refill on your cardiac medications before your next appointment, please call your pharmacy*   Lab Work: [address] - Closed Friday Afternoons  You need to have labs done when you are fasting.  You can come Monday through Friday 8:00 am to 11:30am and 1:00 to 4:30. You do not need to make an appointment as the order has already been placed. The labs you are going to have done are BMET, CBC, TSH, LFT,  Lipids, Hgb A1C, Vit D   Testing/Procedures: None Ordered   Follow-Up: At [facility], you and your health needs are our priority.  As part of our continuing mission to provide you with exceptional heart care, we have created designated Provider Care Teams.  These Care Teams include your primary Cardiologist (physician) and Advanced Practice Providers (APPs -  Physician Assistants and Nurse Practitioners) who all work together to provide you with the care you need, when you need it.  We recommend signing up for the patient portal called "MyChart".  Sign up information is provided on this After Visit Summary.  MyChart is used to connect with patients for Virtual Visits (Telemedicine).  Patients are able to view lab/test results, encounter notes, upcoming appointments, etc.  Non-urgent messages can be sent to your provider as well.   To learn more about what you can do with MyChart, go to [email or web address].    Your next appointment:   12 month(s)  The format for your next appointment:   In Person  Provider:   Jyl Heinz, MD    Other Instructions NA

## 2022-10-17 NOTE — Progress Notes (Signed)
Cardiology Office Note:    Date:  10/17/2022   ID:  Travis Li, DOB 01-31-1981, MRN [id number]  PCP:  Luetta Nutting, DO  Cardiologist:  Jenean Lindau, MD   Referring MD: Luetta Nutting, DO    ASSESSMENT:    PVC's Hyperlipidemia Vit D Deficiency  PLAN:    In order of problems listed above:  Primary prevention stressed with the patient.  Importance of compliance with diet medication stressed and he vocalized understanding. Frequent PVCs: These have resolved.  Patient is asymptomatic and does not have any issues. Mixed dyslipidemia: Practicing good diet and exercise.  I told him to walk at least half an hour a day 5 days a week and he promises to do so.  He will be back in the next few days for complete blood work including fasting lipids History of vitamin D deficiency and we will check this for him.  We will also check blood work for A1c. Patient will be seen in follow-up appointment in 6 months or earlier if the patient has any concerns    Medication Adjustments/Labs and Tests Ordered: Current medicines are reviewed at length with the patient today.  Concerns regarding medicines are outlined above.  Orders Placed This Encounter  Procedures   Basic metabolic panel   CBC   TSH   Hepatic function panel   Lipid panel   VITAMIN D 25 Hydroxy (Vit-D Deficiency, Fractures)   Hemoglobin A1c   EKG 12-Lead   No orders of the defined types were placed in this encounter.    No chief complaint on file.    History of Present Illness:    Travis Li is a 41 y.o. male.  Patient has past medical history of dyslipidemia, PVCs.  He has history of hypertension but with diet exercise and lifestyle modification he does not have high blood pressure at this time.  He denies any problems at this time and takes care of activities of daily living.  No chest pain orthopnea or PND.  At the time of my evaluation, the patient is alert awake oriented and in no distress.  Past Medical  History:  Diagnosis Date   Abnormal colonoscopy 08/06/2021   Colon polyp    Dyslipidemia 01/30/2018   Environmental allergies 06/22/2012   Epistaxis 06/14/2020   Essential hypertension 01/12/2019   Exercise-induced asthma 01/30/2018   Hemorrhoids 08/06/2021   History of migraine headaches 06/22/2012   Hx of adenomatous colonic polyps 08/06/2021   Influenza    Influenza-like illness 01/11/2022   Lower abdominal pain 09/12/2020   Metatarsalgia of both feet 06/27/2021   Migraines    Mitral valve prolapse 09/05/2016   PVC's (premature ventricular contractions) 09/05/2016   Rectal bleeding 08/06/2021   Tinea pedis 05/09/2020   Tubular adenoma of rectum 08/06/2021    Past Surgical History:  Procedure Laterality Date   BIOPSY  10/03/2021   Procedure: BIOPSY;  Surgeon: Irving Copas., MD;  Location: [facility];  Service: Gastroenterology;;   COLONOSCOPY     COLONOSCOPY WITH PROPOFOL N/A 10/03/2021   Procedure: COLONOSCOPY WITH PROPOFOL;  Surgeon: Irving Copas., MD;  Location: [facility];  Service: Gastroenterology;  Laterality: N/A;   ENDOSCOPIC MUCOSAL RESECTION N/A 10/03/2021   Procedure: ENDOSCOPIC MUCOSAL RESECTION;  Surgeon: Rush Landmark Telford Nab., MD;  Location: [facility];  Service: Gastroenterology;  Laterality: N/A;   WISDOM TOOTH EXTRACTION      Current Medications: Current Meds  Medication Sig   Multiple Vitamin (MULTIVITAMIN WITH MINERALS) TABS tablet Take 1  tablet by mouth daily.   Omega-3 Fatty Acids (FISH OIL) 1000 MG CAPS Take 1,000 mg by mouth daily.     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  Tobacco Use   Smoking status: Never   Smokeless tobacco: Never  Vaping Use    Vaping Use: Never used  Substance and Sexual Activity   Alcohol use: Yes    Comment: 2 per week   Drug use: No   Sexual activity: Not on file  Other Topics Concern   Not on file  Social History Narrative   Not on file   Social Determinants of Health   Financial Resource Strain: Not on file  Food Insecurity: Not on file  Transportation Needs: Not on file  Physical Activity: Not on file  Stress: Not on file  Social Connections: Not on file     Family History: The patient's family history includes Cancer in his mother; Colon polyps in his father; Diabetes in his father and paternal grandfather; Heart disease in his paternal grandfather; Hypercholesterolemia in his mother; Hypertension in his mother; Irritable bowel syndrome in his father. There is no history of Colon cancer, Esophageal cancer, Pancreatic cancer, Stomach cancer, Inflammatory bowel disease, Liver disease, or Rectal cancer.  ROS:   Please see the history of present illness.    All other systems reviewed and are negative.  EKGs/Labs/Other Studies Reviewed:    The following studies were reviewed today: I discussed my findings with the patient at length.  EKG reveals sinus rhythm and nonspecific ST-T changes   Recent Labs: 10/22/2021: ALT 35; BUN 15; Creatinine, Ser 1.27; Hemoglobin 16.4; Platelets 259; Potassium 4.8; Sodium 142; TSH 2.120  Recent Lipid Panel    Component Value Date/Time   CHOL 225 (H) 10/22/2021 0905   TRIG 180 (H) 10/22/2021 0905   HDL 42 10/22/2021 0905   CHOLHDL 5.4 (H) 10/22/2021 0905   CHOLHDL 3.8 02/22/2019 0807   LDLCALC 150 (H) 10/22/2021 0905   LDLCALC 104 (H) 02/22/2019 0807    Physical Exam:    VS:  BP 126/74   Pulse 84   Ht '6\' 8"'$  (2.032 m)   Wt 227 lb 1.3 oz (103 kg)   SpO2 96%   BMI 24.95 kg/m     Wt Readings from Last 3 Encounters:  10/17/22 227 lb 1.3 oz (103 kg)  09/26/22 229 lb (103.9 kg)  03/26/22 226 lb 12.8 oz (102.9 kg)     GEN: Patient is in no acute  distress HEENT: Normal NECK: No JVD; No carotid bruits LYMPHATICS: No lymphadenopathy CARDIAC: Hear sounds regular, 2/6 systolic murmur at the apex. RESPIRATORY:  Clear to auscultation without rales, wheezing or rhonchi  ABDOMEN: Soft, non-tender, non-distended MUSCULOSKELETAL:  No edema; No deformity  SKIN: Warm and dry NEUROLOGIC:  Alert and oriented x 3 PSYCHIATRIC:  Normal affect   Signed, Jenean Lindau, MD  10/17/2022 8:34 AM    [place]

## 2022-10-23 DIAGNOSIS — E785 Hyperlipidemia, unspecified: Secondary | ICD-10-CM | POA: Diagnosis not present

## 2022-10-23 DIAGNOSIS — I493 Ventricular premature depolarization: Secondary | ICD-10-CM | POA: Diagnosis not present

## 2022-10-23 DIAGNOSIS — E559 Vitamin D deficiency, unspecified: Secondary | ICD-10-CM | POA: Diagnosis not present

## 2022-10-24 DIAGNOSIS — J9811 Atelectasis: Secondary | ICD-10-CM | POA: Diagnosis not present

## 2022-10-24 DIAGNOSIS — R079 Chest pain, unspecified: Secondary | ICD-10-CM | POA: Diagnosis not present

## 2022-10-24 DIAGNOSIS — M549 Dorsalgia, unspecified: Secondary | ICD-10-CM | POA: Diagnosis not present

## 2022-10-24 DIAGNOSIS — Z888 Allergy status to other drugs, medicaments and biological substances status: Secondary | ICD-10-CM | POA: Diagnosis not present

## 2022-10-24 DIAGNOSIS — M546 Pain in thoracic spine: Secondary | ICD-10-CM | POA: Diagnosis not present

## 2022-10-24 LAB — BASIC METABOLIC PANEL
BUN/Creatinine Ratio: 14 (ref 9–20)
BUN: 18 mg/dL (ref 6–24)
CO2: 24 mmol/L (ref 20–29)
Calcium: 10.2 mg/dL (ref 8.7–10.2)
Chloride: 101 mmol/L (ref 96–106)
Creatinine, Ser: 1.32 mg/dL — ABNORMAL HIGH (ref 0.76–1.27)
Glucose: 90 mg/dL (ref 70–99)
Potassium: 5 mmol/L (ref 3.5–5.2)
Sodium: 144 mmol/L (ref 134–144)
eGFR: 69 mL/min/{1.73_m2} (ref >59)

## 2022-10-24 LAB — LIPID PANEL
Chol/HDL Ratio: 5.5 ratio — ABNORMAL HIGH (ref 0.0–5.0)
Cholesterol, Total: 240 mg/dL — ABNORMAL HIGH (ref 100–199)
HDL: 44 mg/dL (ref >39)
LDL Chol Calc (NIH): 164 mg/dL — ABNORMAL HIGH (ref 0–99)
Triglycerides: 173 mg/dL — ABNORMAL HIGH (ref 0–149)
VLDL Cholesterol Cal: 32 mg/dL (ref 5–40)

## 2022-10-24 LAB — CBC
Hematocrit: 47.2 % (ref 37.5–51.0)
Hemoglobin: 16.1 g/dL (ref 13.0–17.7)
MCH: 31.8 pg (ref 26.6–33.0)
MCHC: 34.1 g/dL (ref 31.5–35.7)
MCV: 93 fL (ref 79–97)
Platelets: 241 10*3/uL (ref 150–450)
RBC: 5.06 x10E6/uL (ref 4.14–5.80)
RDW: 11.8 % (ref 11.6–15.4)
WBC: 5.6 10*3/uL (ref 3.4–10.8)

## 2022-10-24 LAB — HEPATIC FUNCTION PANEL
ALT: 30 IU/L (ref 0–44)
AST: 17 IU/L (ref 0–40)
Albumin: 5 g/dL (ref 4.1–5.1)
Alkaline Phosphatase: 54 IU/L (ref 44–121)
Bilirubin Total: 0.6 mg/dL (ref 0.0–1.2)
Bilirubin, Direct: 0.14 mg/dL (ref 0.00–0.40)
Total Protein: 7.4 g/dL (ref 6.0–8.5)

## 2022-10-24 LAB — HEMOGLOBIN A1C
Est. average glucose Bld gHb Est-mCnc: 108 mg/dL
Hgb A1c MFr Bld: 5.4 % (ref 4.8–5.6)

## 2022-10-24 LAB — VITAMIN D 25 HYDROXY (VIT D DEFICIENCY, FRACTURES): Vit D, 25-Hydroxy: 36.4 ng/mL (ref 30.0–100.0)

## 2022-10-24 LAB — TSH: TSH: 2.1 u[IU]/mL (ref 0.450–4.500)

## 2022-10-25 ENCOUNTER — Telehealth: Payer: Self-pay | Admitting: General Practice

## 2022-10-25 DIAGNOSIS — M549 Dorsalgia, unspecified: Secondary | ICD-10-CM | POA: Diagnosis not present

## 2022-10-25 DIAGNOSIS — R079 Chest pain, unspecified: Secondary | ICD-10-CM | POA: Diagnosis not present

## 2022-10-25 DIAGNOSIS — J9811 Atelectasis: Secondary | ICD-10-CM | POA: Diagnosis not present

## 2022-10-25 NOTE — Telephone Encounter (Signed)
Transition Care Management Unsuccessful Follow-up Telephone Call  Date of discharge and from where:  10/25/22 from [facility]  Attempts:  1st Attempt  Reason for unsuccessful TCM follow-up call:  No answer/busy

## 2022-10-28 NOTE — Telephone Encounter (Signed)
Transition Care Management Unsuccessful Follow-up Telephone Call  Date of discharge and from where:  10/25/22 from [facility]  Attempts:  2nd Attempt  Reason for unsuccessful TCM follow-up call:  No answer/busy

## 2022-10-29 ENCOUNTER — Telehealth: Payer: Self-pay

## 2022-10-29 DIAGNOSIS — M4726 Other spondylosis with radiculopathy, lumbar region: Secondary | ICD-10-CM | POA: Diagnosis not present

## 2022-10-29 DIAGNOSIS — M4724 Other spondylosis with radiculopathy, thoracic region: Secondary | ICD-10-CM | POA: Diagnosis not present

## 2022-10-29 DIAGNOSIS — E785 Hyperlipidemia, unspecified: Secondary | ICD-10-CM

## 2022-10-29 DIAGNOSIS — M9902 Segmental and somatic dysfunction of thoracic region: Secondary | ICD-10-CM | POA: Diagnosis not present

## 2022-10-29 DIAGNOSIS — M4728 Other spondylosis with radiculopathy, sacral and sacrococcygeal region: Secondary | ICD-10-CM | POA: Diagnosis not present

## 2022-10-29 MED ORDER — ATORVASTATIN CALCIUM 10 MG PO TABS: 10.0000 mg | ORAL_TABLET | Freq: Every day | ORAL | 3 refills | Status: DC

## 2022-10-29 NOTE — Telephone Encounter (Signed)
-----   Message from Jenean Lindau, MD sent at 10/28/2022  1:54 PM EST ----- Atorvastatin that is what I meant 10 mg daily and liver lipid check in 6 weeks. ----- Message ----- From: Truddie Hidden, RN Sent: 10/28/2022   1:42 PM EST To: Jenean Lindau, MD  What statin  ----- Message ----- From: Jenean Lindau, MD Sent: 10/28/2022   1:09 PM EST To: Truddie Hidden, RN  I am glad you asked.  Give him the option he can diet and continue to exercise.  That is normal clear-cut indication of statin a low dose like 10 mg of Adderall would be beneficial. ----- Message ----- From: Truddie Hidden, RN Sent: 10/28/2022  12:42 PM EST To: Jenean Lindau, MD  What about his lipids being elevated? ----- Message ----- From: Jenean Lindau, MD Sent: 10/28/2022   8:28 AM EST To: Truddie Hidden, RN  normal ----- Message ----- From: Truddie Hidden, RN Sent: 10/28/2022   7:59 AM EST To: Jenean Lindau, MD  ADDENDUM REPORT: 12/31/2021 19:21   CLINICAL DATA:  Cardiovascular Disease Risk stratification   EXAM: Coronary Calcium Score   TECHNIQUE: A gated, non-contrast computed tomography scan of the heart was performed using 98m slice thickness. Axial images were analyzed on a dedicated workstation. Calcium scoring of the coronary arteries was performed using the Agatston method.   FINDINGS: Coronary Calcium Score:   Left main: 0   Left anterior descending artery: 0   Left circumflex artery: 0   Right coronary artery: 0   Total: 0   Percentile: NA   Pericardium: Normal.   [facility]card[facility]   IMPRESSION: Coronary calcium score of 0.   ----- Message ----- From: RJenean Lindau MD Sent: 10/25/2022   2:23 PM EST To: STruddie Hidden RN  Lipids are markedly elevated.  I would like to do a calcium score on him to see if I need to start statin therapy.  Diet and exercise.  Encouraged him to see dietitian also keep  himself well-hydrated and get kidney function checked by primary this can be done in a couple of weeks.  Copy primary RJenean Lindau MD 10/25/2022 2:22 PM

## 2022-10-30 NOTE — Telephone Encounter (Signed)
Transition Care Management Unsuccessful Follow-up Telephone Call  Date of discharge and from where:  10/25/22 from [facility]  Attempts:  3rd Attempt  Reason for unsuccessful TCM follow-up call:  No answer/busy

## 2022-12-19 DIAGNOSIS — E785 Hyperlipidemia, unspecified: Secondary | ICD-10-CM | POA: Diagnosis not present

## 2022-12-20 LAB — LIPID PANEL
Chol/HDL Ratio: 3.5 ratio (ref 0.0–5.0)
Cholesterol, Total: 144 mg/dL (ref 100–199)
HDL: 41 mg/dL (ref >39)
LDL Chol Calc (NIH): 85 mg/dL (ref 0–99)
Triglycerides: 97 mg/dL (ref 0–149)
VLDL Cholesterol Cal: 18 mg/dL (ref 5–40)

## 2022-12-20 LAB — HEPATIC FUNCTION PANEL
ALT: 30 IU/L (ref 0–44)
AST: 19 IU/L (ref 0–40)
Albumin: 4.9 g/dL (ref 4.1–5.1)
Alkaline Phosphatase: 58 IU/L (ref 44–121)
Bilirubin Total: 0.8 mg/dL (ref 0.0–1.2)
Bilirubin, Direct: 0.22 mg/dL (ref 0.00–0.40)
Total Protein: 7.1 g/dL (ref 6.0–8.5)

## 2023-01-21 ENCOUNTER — Other Ambulatory Visit: Payer: Self-pay

## 2023-01-21 MED ORDER — ATORVASTATIN CALCIUM 10 MG PO TABS: 10.0000 mg | ORAL_TABLET | Freq: Every day | ORAL | 2 refills | Status: DC

## 2023-02-10 ENCOUNTER — Encounter: Payer: Self-pay | Admitting: Sports Medicine

## 2023-02-13 ENCOUNTER — Ambulatory Visit (INDEPENDENT_AMBULATORY_CARE_PROVIDER_SITE_OTHER): Payer: BC Managed Care – PPO

## 2023-02-13 ENCOUNTER — Ambulatory Visit (INDEPENDENT_AMBULATORY_CARE_PROVIDER_SITE_OTHER): Payer: BC Managed Care – PPO | Admitting: Sports Medicine

## 2023-02-13 DIAGNOSIS — M79671 Pain in right foot: Secondary | ICD-10-CM | POA: Diagnosis not present

## 2023-02-13 DIAGNOSIS — M7742 Metatarsalgia, left foot: Secondary | ICD-10-CM | POA: Diagnosis not present

## 2023-02-13 DIAGNOSIS — M7741 Metatarsalgia, right foot: Secondary | ICD-10-CM

## 2023-02-13 DIAGNOSIS — M79672 Pain in left foot: Secondary | ICD-10-CM | POA: Diagnosis not present

## 2023-02-13 MED ORDER — MELOXICAM 15 MG PO TABS: ORAL_TABLET | ORAL | 3 refills | Status: DC

## 2023-02-13 NOTE — Progress Notes (Signed)
    Procedures performed today:    None.  Independent interpretation of notes and tests performed by another provider:   None.  Brief History, Exam, Impression, and Recommendations:    Metatarsalgia of both feet [name] returns, he is a very pleasant 42 year old male, we treated metatarsalgia back in the fall of last year, he did well with metatarsal pads. Unfortunately having increasing pain plantar second metatarsal bilaterally, he really does not do much barefoot walking, he did get some orthotics with built-in metatarsal pads but these have not been very helpful lately. We are going to add additional metatarsal padding on top of the ones in his orthotic as I think they are too diminutive, meloxicam, return to see me in 2 to 3 weeks and if insufficient improvement we will get an MRI to rule out Morton's neuroma.  Chronic process with exacerbation and pharmacologic intervention  ____________________________________________ Gwen Her. Dianah Field, M.D., ABFM., CAQSM., AME. Primary Care and Sports Medicine [facility]  Adjunct Professor of [employer] [employer]  Risk manager

## 2023-02-13 NOTE — Assessment & Plan Note (Signed)
Travis Li returns, he is a very pleasant 42 year old male, we treated metatarsalgia back in the fall of last year, he did well with metatarsal pads. Unfortunately having increasing pain plantar second metatarsal bilaterally, he really does not do much barefoot walking, he did get some orthotics with built-in metatarsal pads but these have not been very helpful lately. We are going to add additional metatarsal padding on top of the ones in his orthotic as I think they are too diminutive, meloxicam, return to see me in 2 to 3 weeks and if insufficient improvement we will get an MRI to rule out Morton's neuroma.

## 2023-02-21 ENCOUNTER — Ambulatory Visit (INDEPENDENT_AMBULATORY_CARE_PROVIDER_SITE_OTHER): Payer: BC Managed Care – PPO | Admitting: Family Medicine

## 2023-02-21 ENCOUNTER — Encounter: Payer: Self-pay | Admitting: Family Medicine

## 2023-02-21 VITALS — BP 135/72 | HR 61 | Ht >= 80 in | Wt 225.0 lb

## 2023-02-21 DIAGNOSIS — K644 Residual hemorrhoidal skin tags: Secondary | ICD-10-CM | POA: Diagnosis not present

## 2023-02-21 DIAGNOSIS — K4090 Unilateral inguinal hernia, without obstruction or gangrene, not specified as recurrent: Secondary | ICD-10-CM | POA: Diagnosis not present

## 2023-02-21 NOTE — Patient Instructions (Signed)
I am concerned that you may have a hernia.  I would like for you to see a surgeon for evaluation.  You should be contacted for this appointment.

## 2023-02-23 ENCOUNTER — Encounter: Payer: Self-pay | Admitting: Family Medicine

## 2023-02-23 DIAGNOSIS — K4090 Unilateral inguinal hernia, without obstruction or gangrene, not specified as recurrent: Secondary | ICD-10-CM

## 2023-02-23 DIAGNOSIS — K644 Residual hemorrhoidal skin tags: Secondary | ICD-10-CM | POA: Insufficient documentation

## 2023-02-23 HISTORY — DX: Residual hemorrhoidal skin tags: K64.4

## 2023-02-23 HISTORY — DX: Unilateral inguinal hernia, without obstruction or gangrene, not specified as recurrent: K40.90

## 2023-02-23 NOTE — Assessment & Plan Note (Signed)
Concern for left inguinal hernia.  Referral placed to general surgery.

## 2023-02-23 NOTE — Assessment & Plan Note (Signed)
He was told that he had an anal skin tag during previous colonoscopy.  Requesting referral to dermatology to have this treated.

## 2023-02-23 NOTE — Progress Notes (Signed)
Travis Li - 42 y.o. male MRN [id number]  Date of birth: 06-30-1981  Subjective Chief Complaint  Patient presents with   Testicle Pain   Abdominal Pain    HPI Travis Li is a 42 year old male here today with complaint of abdominal pain.  He has had intermittent left lower quadrant abdominal pain.  He had similar pain a couple years ago with normal CT of the abdomen and pelvis.  Had colonoscopy as well which was normal.  Most recently pain feels like sharp sensation with some pressure at times in the left lower quadrant.  A few days ago he did feel some pain that radiated into his left testicle area.  Denies any changes to bowels.  No blood in his stool or dark stools.  He denies any urinary symptoms.  ROS:  A comprehensive ROS was completed and negative except as noted per HPI  Allergies  Allergen Reactions   Minocycline Hives    Lips swell   Lisinopril Other (See Comments)    Fatigue    Past Medical History:  Diagnosis Date   Abnormal colonoscopy 08/06/2021   Colon polyp    Dyslipidemia 01/30/2018   Environmental allergies 06/22/2012   Epistaxis 06/14/2020   Essential hypertension 01/12/2019   Exercise-induced asthma 01/30/2018   Hemorrhoids 08/06/2021   History of migraine headaches 06/22/2012   Hx of adenomatous colonic polyps 08/06/2021   Influenza    Influenza-like illness 01/11/2022   Lower abdominal pain 09/12/2020   Metatarsalgia of both feet 06/27/2021   Migraines    Mitral valve prolapse 09/05/2016   PVC's (premature ventricular contractions) 09/05/2016   Rectal bleeding 08/06/2021   Tinea pedis 05/09/2020   Tubular adenoma of rectum 08/06/2021    Past Surgical History:  Procedure Laterality Date   BIOPSY  10/03/2021   Procedure: BIOPSY;  Surgeon: Irving Copas., MD;  Location: [facility];  Service: Gastroenterology;;   COLONOSCOPY     COLONOSCOPY WITH PROPOFOL N/A 10/03/2021   Procedure: COLONOSCOPY WITH PROPOFOL;  Surgeon: Irving Copas.,  MD;  Location: [facility];  Service: Gastroenterology;  Laterality: N/A;   ENDOSCOPIC MUCOSAL RESECTION N/A 10/03/2021   Procedure: ENDOSCOPIC MUCOSAL RESECTION;  Surgeon: Rush Landmark Telford Nab., MD;  Location: [facility];  Service: Gastroenterology;  Laterality: N/A;   WISDOM TOOTH EXTRACTION      Social History   Socioeconomic History   Marital status: Married    Spouse name: Not on file   Number of children: 1   Years of education: Not on file   Highest education level: Not on file  Occupational History   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  Tobacco Use   Smoking status: Never   Smokeless tobacco: Never  Vaping Use   Vaping Use: Never used  Substance and Sexual Activity   Alcohol use: Yes    Comment: 2 per week   Drug use: No   Sexual activity: Not on file  Other Topics Concern   Not on file  Social History Narrative   Not on file   Social Determinants of Health   Financial Resource Strain: Not on file  Food Insecurity: Not on file  Transportation Needs: Not on file  Physical Activity: Not on file  Stress: Not on file  Social Connections: Not on file    Family History  Problem Relation Age of Onset   Hypercholesterolemia Mother    Cancer Mother        Endometrial cancer   Hypertension Mother    Diabetes  Father    Colon polyps Father    Irritable bowel syndrome Father    Heart disease Paternal Grandfather    Diabetes Paternal Grandfather        borderline   Colon cancer Neg Hx    Esophageal cancer Neg Hx    Pancreatic cancer Neg Hx    Stomach cancer Neg Hx    Inflammatory bowel disease Neg Hx    Liver disease Neg Hx    Rectal cancer Neg Hx     Health Maintenance  Topic Date Due   Hepatitis C Screening  Never done   DTaP/Tdap/Td (2 - Td or Tdap) 07/15/2021   INFLUENZA VACCINE  03/09/2023 (Originally 07/09/2022)   COVID-19 Vaccine (3 - Moderna risk series) 03/08/2024 (Originally 04/09/2020)   COLONOSCOPY (Pts 45-52yrs Insurance coverage  will need to be confirmed)  10/03/2024   HIV Screening  Completed   HPV VACCINES  Aged Out     ----------------------------------------------------------------------------------------------------------------------------------------------------------------------------------------------------------------- Physical Exam BP 135/72 (BP Location: Left Arm, Patient Position: Sitting, Cuff Size: Normal)   Pulse 61   Ht 6\' 8"  (2.032 m)   Wt 225 lb (102.1 kg)   SpO2 99%   BMI 24.72 kg/m   Physical Exam Constitutional:      Appearance: He is well-developed.  HENT:     Head: Normocephalic and atraumatic.  Eyes:     General: No scleral icterus. Cardiovascular:     Rate and Rhythm: Normal rate and regular rhythm.  Abdominal:     General: Abdomen is flat. There is no distension.     Palpations: Abdomen is soft.     Tenderness: There is abdominal tenderness (Tenderness to palpation along the left lower quadrant and left inguinal area.).     Comments: Patient is to be a slight bulge in the left inguinal area with Valsalva.  Genitourinary:    Testes: Normal.  Musculoskeletal:     Cervical back: Neck supple.  Neurological:     Mental Status: He is alert.     ------------------------------------------------------------------------------------------------------------------------------------------------------------------------------------------------------------------- Assessment and Plan  Left inguinal hernia Concern for left inguinal hernia.  Referral placed to general surgery.  Skin tag of anus He was told that he had an anal skin tag during previous colonoscopy.  Requesting referral to dermatology to have this treated.   No orders of the defined types were placed in this encounter.   No follow-ups on file.    This visit occurred during the SARS-CoV-2 public health emergency.  Safety protocols were in place, including screening questions prior to the visit, additional usage of  staff PPE, and extensive cleaning of exam room while observing appropriate contact time as indicated for disinfecting solutions.

## 2023-02-27 DIAGNOSIS — K429 Umbilical hernia without obstruction or gangrene: Secondary | ICD-10-CM | POA: Diagnosis not present

## 2023-02-27 DIAGNOSIS — R1012 Left upper quadrant pain: Secondary | ICD-10-CM | POA: Diagnosis not present

## 2023-02-27 DIAGNOSIS — R1032 Left lower quadrant pain: Secondary | ICD-10-CM | POA: Diagnosis not present

## 2023-02-27 DIAGNOSIS — Z1330 Encounter for screening examination for mental health and behavioral disorders, unspecified: Secondary | ICD-10-CM | POA: Diagnosis not present

## 2023-03-05 ENCOUNTER — Ambulatory Visit (INDEPENDENT_AMBULATORY_CARE_PROVIDER_SITE_OTHER): Payer: BC Managed Care – PPO | Admitting: Sports Medicine

## 2023-03-05 DIAGNOSIS — M7742 Metatarsalgia, left foot: Secondary | ICD-10-CM

## 2023-03-05 DIAGNOSIS — M7741 Metatarsalgia, right foot: Secondary | ICD-10-CM

## 2023-03-05 NOTE — Progress Notes (Signed)
    Procedures performed today:    None.  Independent interpretation of notes and tests performed by another provider:   None.  Brief History, Exam, Impression, and Recommendations:    Metatarsalgia of both feet [name] returns, is a very pleasant 42 year old male, he has a history of metatarsalgia, did better with metatarsal pads back in the fall of last year, unfortunately he started to have recurrence of pain, plantar second metatarsal bilaterally, he has been avoiding barefoot walking, he does have orthotics, we added some metatarsal pads, he has had NSAIDs, he has thus had greater than 6 weeks of physician directed conservative treatment, pain is predominantly in the first intermetatarsal space left foot and more generalized plantar second through fourth MTP pads. At this point due to failure of conservative treatment we will proceed with MRIs of both feet. This will be looking for a Morton's neuroma and for injection planning.    ____________________________________________ Gwen Her. Dianah Field, M.D., ABFM., CAQSM., AME. Primary Care and Sports Medicine [facility]  Adjunct Professor of [employer] [employer] [facility] of Medicine  Risk manager

## 2023-03-05 NOTE — Assessment & Plan Note (Signed)
Travis Li returns, is a very pleasant 42 year old male, he has a history of metatarsalgia, did better with metatarsal pads back in the fall of last year, unfortunately he started to have recurrence of pain, plantar second metatarsal bilaterally, he has been avoiding barefoot walking, he does have orthotics, we added some metatarsal pads, he has had NSAIDs, he has thus had greater than 6 weeks of physician directed conservative treatment, pain is predominantly in the first intermetatarsal space left foot and more generalized plantar second through fourth MTP pads. At this point due to failure of conservative treatment we will proceed with MRIs of both feet. This will be looking for a Morton's neuroma and for injection planning.

## 2023-03-06 DIAGNOSIS — D225 Melanocytic nevi of trunk: Secondary | ICD-10-CM | POA: Diagnosis not present

## 2023-03-06 DIAGNOSIS — L209 Atopic dermatitis, unspecified: Secondary | ICD-10-CM | POA: Diagnosis not present

## 2023-03-10 ENCOUNTER — Ambulatory Visit (INDEPENDENT_AMBULATORY_CARE_PROVIDER_SITE_OTHER): Payer: BC Managed Care – PPO

## 2023-03-10 DIAGNOSIS — M7741 Metatarsalgia, right foot: Secondary | ICD-10-CM

## 2023-03-10 DIAGNOSIS — M7742 Metatarsalgia, left foot: Secondary | ICD-10-CM

## 2023-03-10 DIAGNOSIS — M79672 Pain in left foot: Secondary | ICD-10-CM | POA: Diagnosis not present

## 2023-03-10 DIAGNOSIS — M19071 Primary osteoarthritis, right ankle and foot: Secondary | ICD-10-CM | POA: Diagnosis not present

## 2023-03-17 DIAGNOSIS — K429 Umbilical hernia without obstruction or gangrene: Secondary | ICD-10-CM | POA: Diagnosis not present

## 2023-03-17 DIAGNOSIS — N5089 Other specified disorders of the male genital organs: Secondary | ICD-10-CM | POA: Diagnosis not present

## 2023-03-17 DIAGNOSIS — R1012 Left upper quadrant pain: Secondary | ICD-10-CM | POA: Diagnosis not present

## 2023-03-17 DIAGNOSIS — K4090 Unilateral inguinal hernia, without obstruction or gangrene, not specified as recurrent: Secondary | ICD-10-CM | POA: Diagnosis not present

## 2023-03-17 DIAGNOSIS — N433 Hydrocele, unspecified: Secondary | ICD-10-CM | POA: Diagnosis not present

## 2023-03-27 DIAGNOSIS — K4090 Unilateral inguinal hernia, without obstruction or gangrene, not specified as recurrent: Secondary | ICD-10-CM | POA: Diagnosis not present

## 2023-03-27 DIAGNOSIS — K429 Umbilical hernia without obstruction or gangrene: Secondary | ICD-10-CM | POA: Insufficient documentation

## 2023-03-27 HISTORY — DX: Umbilical hernia without obstruction or gangrene: K42.9

## 2023-04-29 DIAGNOSIS — K429 Umbilical hernia without obstruction or gangrene: Secondary | ICD-10-CM | POA: Diagnosis not present

## 2023-04-29 DIAGNOSIS — Z79899 Other long term (current) drug therapy: Secondary | ICD-10-CM | POA: Diagnosis not present

## 2023-04-29 DIAGNOSIS — Z888 Allergy status to other drugs, medicaments and biological substances status: Secondary | ICD-10-CM | POA: Diagnosis not present

## 2023-04-29 DIAGNOSIS — K4020 Bilateral inguinal hernia, without obstruction or gangrene, not specified as recurrent: Secondary | ICD-10-CM | POA: Diagnosis not present

## 2023-04-29 DIAGNOSIS — Z8679 Personal history of other diseases of the circulatory system: Secondary | ICD-10-CM | POA: Diagnosis not present

## 2023-05-18 DIAGNOSIS — J189 Pneumonia, unspecified organism: Secondary | ICD-10-CM | POA: Diagnosis not present

## 2023-05-18 DIAGNOSIS — J989 Respiratory disorder, unspecified: Secondary | ICD-10-CM | POA: Diagnosis not present

## 2023-05-18 DIAGNOSIS — Z20822 Contact with and (suspected) exposure to covid-19: Secondary | ICD-10-CM | POA: Diagnosis not present

## 2023-05-18 DIAGNOSIS — R509 Fever, unspecified: Secondary | ICD-10-CM | POA: Diagnosis not present

## 2023-05-18 DIAGNOSIS — E860 Dehydration: Secondary | ICD-10-CM | POA: Diagnosis not present

## 2023-05-19 DIAGNOSIS — Z1152 Encounter for screening for COVID-19: Secondary | ICD-10-CM | POA: Diagnosis not present

## 2023-05-19 DIAGNOSIS — R109 Unspecified abdominal pain: Secondary | ICD-10-CM | POA: Diagnosis not present

## 2023-05-19 DIAGNOSIS — Z881 Allergy status to other antibiotic agents status: Secondary | ICD-10-CM | POA: Diagnosis not present

## 2023-05-19 DIAGNOSIS — I493 Ventricular premature depolarization: Secondary | ICD-10-CM | POA: Diagnosis not present

## 2023-05-19 DIAGNOSIS — Z888 Allergy status to other drugs, medicaments and biological substances status: Secondary | ICD-10-CM | POA: Diagnosis not present

## 2023-05-19 DIAGNOSIS — R0902 Hypoxemia: Secondary | ICD-10-CM | POA: Diagnosis not present

## 2023-05-19 DIAGNOSIS — R590 Localized enlarged lymph nodes: Secondary | ICD-10-CM | POA: Diagnosis not present

## 2023-05-19 DIAGNOSIS — E876 Hypokalemia: Secondary | ICD-10-CM

## 2023-05-19 DIAGNOSIS — J189 Pneumonia, unspecified organism: Secondary | ICD-10-CM | POA: Diagnosis not present

## 2023-05-19 DIAGNOSIS — J9601 Acute respiratory failure with hypoxia: Secondary | ICD-10-CM | POA: Insufficient documentation

## 2023-05-19 DIAGNOSIS — E871 Hypo-osmolality and hyponatremia: Secondary | ICD-10-CM | POA: Insufficient documentation

## 2023-05-19 DIAGNOSIS — Z9889 Other specified postprocedural states: Secondary | ICD-10-CM | POA: Diagnosis not present

## 2023-05-19 DIAGNOSIS — Z79899 Other long term (current) drug therapy: Secondary | ICD-10-CM | POA: Diagnosis not present

## 2023-05-19 DIAGNOSIS — J984 Other disorders of lung: Secondary | ICD-10-CM | POA: Diagnosis not present

## 2023-05-19 DIAGNOSIS — J8410 Pulmonary fibrosis, unspecified: Secondary | ICD-10-CM | POA: Diagnosis not present

## 2023-05-19 DIAGNOSIS — I10 Essential (primary) hypertension: Secondary | ICD-10-CM | POA: Diagnosis not present

## 2023-05-19 DIAGNOSIS — J157 Pneumonia due to Mycoplasma pneumoniae: Secondary | ICD-10-CM | POA: Insufficient documentation

## 2023-05-19 DIAGNOSIS — Z8719 Personal history of other diseases of the digestive system: Secondary | ICD-10-CM | POA: Diagnosis not present

## 2023-05-19 DIAGNOSIS — Z7409 Other reduced mobility: Secondary | ICD-10-CM | POA: Diagnosis not present

## 2023-05-19 DIAGNOSIS — R0602 Shortness of breath: Secondary | ICD-10-CM | POA: Diagnosis not present

## 2023-05-19 DIAGNOSIS — Z791 Long term (current) use of non-steroidal anti-inflammatories (NSAID): Secondary | ICD-10-CM | POA: Diagnosis not present

## 2023-05-19 DIAGNOSIS — I341 Nonrheumatic mitral (valve) prolapse: Secondary | ICD-10-CM | POA: Diagnosis not present

## 2023-05-19 DIAGNOSIS — E785 Hyperlipidemia, unspecified: Secondary | ICD-10-CM | POA: Diagnosis not present

## 2023-05-19 HISTORY — DX: Hypokalemia: E87.6

## 2023-05-19 HISTORY — DX: Hypo-osmolality and hyponatremia: E87.1

## 2023-05-23 ENCOUNTER — Telehealth: Payer: Self-pay

## 2023-05-23 ENCOUNTER — Telehealth: Payer: Self-pay | Admitting: Family Medicine

## 2023-05-23 NOTE — Telephone Encounter (Signed)
Patient called wanting to know what is recommended by Dr. Ashley Royalty for painful coughing spells. Patient recently discharge from abdominal surgery and scheduled for a follow up on 05/29/2023.

## 2023-05-23 NOTE — Transitions of Care (Post Inpatient/ED Visit) (Signed)
05/23/2023  Name: Travis Li MRN: [id number] DOB: 07-Apr-1981  Today's TOC FU Call Status: Today's TOC FU Call Status:: Successful TOC FU Call Competed TOC FU Call Complete Date: 05/23/23  Transition Care Management Follow-up Telephone Call Date of Discharge: 05/22/23 Discharge Facility: Other ([facility]) Name of Other ([facility]) Discharge Facility: [facility] Type of Discharge: Inpatient Admission Primary Inpatient Discharge Diagnosis:: "PNA of left upper lobe due to mycoplasma pneumoniae" How have you been since you were released from the hospital?: Same (Pt reports he continues to have "really bad coughing spells that are painful due to recent abd. surgery." Cough is productive at times-ranging from yellow-to green sputum. He is taking abxs and using Duonebs as ordered. Denies any SOB.) Any questions or concerns?: Yes Patient Questions/Concerns:: Patient wanting to know what he can take to help manage coughing spells as they are uncomfortable/painful and he's afraid he may "hurt something from recent abd suregry" Patient Questions/Concerns Addressed: Notified Provider of Patient Questions/Concerns (RN CM contacted PCP office at [phone] with Rosanne Sack and discussed issue-she will send message to provider to see what he recommends pt take and someone will follow up with pt)  Items Reviewed: Did you receive and understand the discharge instructions provided?: Yes Medications obtained,verified, and reconciled?: Yes (Medications Reviewed) Any new allergies since your discharge?: No Dietary orders reviewed?: Yes Type of Diet Ordered:: low salt/heart healthy Do you have support at home?: Yes People in Home: spouse Name of Support/Comfort Primary Source: Lauren  Medications Reviewed Today: Medications Reviewed Today     Reviewed by Charlyn Minerva, RN (Registered Nurse) on 05/23/23 at 1136  Med List Status: <None>   Medication Order Taking? Sig Documenting  Provider Last Dose Status Informant  atorvastatin (LIPITOR) 10 MG tablet [id number] Yes Take 1 tablet (10 mg total) by mouth daily. Revankar, Aundra Dubin, MD Taking Active   azithromycin (ZITHROMAX) 500 MG tablet [id number] Yes Take 500 mg by mouth daily. 1 time each day at the same time for 5 days. [provider] Taking Active Self  cefdinir (OMNICEF) 300 MG capsule [id number] Yes Take 300 mg by mouth 2 (two) times daily. 2 (two) times daily for 5 days [provider] Taking Active Self  ipratropium-albuterol (DUONEB) 0.5-2.5 (3) MG/3ML SOLN [id number] Yes Take 3 mLs by nebulization every 6 (six) hours as needed (wheezing,SOB). [provider] Taking Active Self  meloxicam (MOBIC) 15 MG tablet [id number] No One tab PO every 24 hours with a meal for 2 weeks, then once every 24 hours prn pain.  Patient not taking: Reported on 05/23/2023   Monica Becton, MD Not Taking Active   Multiple Vitamin (MULTIVITAMIN WITH MINERALS) TABS tablet [id number] Yes Take 1 tablet by mouth daily. [provider] Taking Active Self  Omega-3 Fatty Acids (FISH OIL) 1000 MG CAPS [id number] Yes Take 1,000 mg by mouth daily. [provider] Taking Active Self            Home Care and Equipment/Supplies: Were Home Health Services Ordered?: NA Any new equipment or medical supplies ordered?: Yes Name of Medical supply agency?: [employer] Were you able to get the equipment/medical supplies?: Yes Do you have any questions related to the use of the equipment/supplies?: No  Functional Questionnaire: Do you need assistance with bathing/showering or dressing?: No Do you need assistance with meal preparation?: No Do you need assistance with eating?: No Do you have difficulty maintaining continence: No Do you need assistance with getting out of bed/getting out of a chair/moving?:  No Do you have difficulty managing or taking your medications?: No  Follow up appointments  reviewed: PCP Follow-up appointment confirmed?: Yes Date of PCP follow-up appointment?: 05/29/23 Follow-up Provider: Dr.[facility] Follow-up appointment confirmed?: NA Do you need transportation to your follow-up appointment?: No Do you understand care options if your condition(s) worsen?: Yes-patient verbalized understanding  SDOH Interventions Today    Flowsheet Row Most Recent Value  SDOH Interventions   Food Insecurity Interventions Intervention Not Indicated  Transportation Interventions Intervention Not Indicated       TOC Interventions Today    Flowsheet Row Most Recent Value  TOC Interventions   TOC Interventions Discussed/Reviewed TOC Interventions Discussed, Arranged PCP follow up within 7 days/Care Guide scheduled, S/S of infection, Contacted provider for patient needs      Interventions Today    Flowsheet Row Most Recent Value  General Interventions   General Interventions Discussed/Reviewed Doctor Visits, Durable Medical Equipment (DME)  Doctor Visits Discussed/Reviewed Doctor Visits Discussed, PCP  Durable Medical Equipment (DME) Other  [neb machine]  PCP/Specialist Visits Compliance with follow-up visit  Education Interventions   Education Provided Provided Education  Provided Verbal Education On Nutrition, When to see the doctor, Medication, Other  [sx mgmt]  Nutrition Interventions   Nutrition Discussed/Reviewed Nutrition Discussed  Pharmacy Interventions   Pharmacy Dicussed/Reviewed Pharmacy Topics Discussed, Medications and their functions        Alessandra Grout [facility] Care Management Care Management Community Coordinator Direct Phone: [phone] Toll Free: [phone] Fax: [phone]

## 2023-05-26 ENCOUNTER — Ambulatory Visit
Admission: RE | Admit: 2023-05-26 | Discharge: 2023-05-26 | Disposition: A | Discharge status: Home / Self Care | Payer: BC Managed Care – PPO | Source: Ambulatory Visit | Attending: Urgent Care | Admitting: Urgent Care

## 2023-05-26 VITALS — BP 110/77 | HR 89 | Temp 98.0°F | Resp 18 | Ht 79.0 in | Wt 213.0 lb

## 2023-05-26 DIAGNOSIS — J4521 Mild intermittent asthma with (acute) exacerbation: Secondary | ICD-10-CM

## 2023-05-26 DIAGNOSIS — J157 Pneumonia due to Mycoplasma pneumoniae: Secondary | ICD-10-CM | POA: Diagnosis not present

## 2023-05-26 MED ORDER — CEFTRIAXONE SODIUM 1 G IJ SOLR
1000.0000 mg | Freq: Once | INTRAMUSCULAR | Status: AC
  Administered 2023-05-26: 1000 mg via INTRAMUSCULAR

## 2023-05-26 MED ORDER — MOXIFLOXACIN HCL 400 MG PO TABS: 400.0000 mg | ORAL_TABLET | Freq: Every day | ORAL | 0 refills | Status: DC

## 2023-05-26 MED ORDER — HYDROCOD POLI-CHLORPHE POLI ER 10-8 MG/5ML PO SUER: 5.0000 mL | Freq: Two times a day (BID) | ORAL | 0 refills | Status: DC | PRN

## 2023-05-26 MED ORDER — ALBUTEROL SULFATE (2.5 MG/3ML) 0.083% IN NEBU: 2.5000 mg | INHALATION_SOLUTION | RESPIRATORY_TRACT | 0 refills | Status: DC | PRN

## 2023-05-26 NOTE — ED Triage Notes (Signed)
Patient did due a Albuterol Nebulizer treatment this morning at 6:00am.

## 2023-05-26 NOTE — Telephone Encounter (Signed)
I would recommend delsym cough syrup.  If not well controlled with this let me know.   CM

## 2023-05-26 NOTE — ED Provider Notes (Signed)
[facility]    CSN: [id number] Arrival date & time: 05/26/23  0944      History   Chief Complaint Chief Complaint  Patient presents with   Wheezing    Entered by patient    HPI Travis Li is a 42 y.o. male.   42 year old male presents today due to concerns of continued cough.  He was seen at a different urgent care on 05/18/23 and dx with RUL pneumonia, started on amoxicillin. The next day he went to the ER and had a CTA chest showing RUL and LLL pneumonia. He was hospitalized from 6/10-6/13 due to multilobular pneumonia and subsequently placed on IV abx.  Respiratory Bio[employer] panel showed a mycoplasma pneumonia.  He had a WBC count of 6.6 while in the hospital.  He was discharged home on 5 days of 500 mg azithromycin, and 5 days of twice daily 300 mg cefdinir.  His last dose of these antibiotics is tomorrow, however patient states he feels he is no better.  Reports hot cold chills, feels that he cannot regulate his temperature.  Is having significant and exponentially worse coughing.  It is dry, nonproductive.  The cough is causing him to have post-surgical hernia pain.  He had a hernia surgery the end of May. He has been taking tessalon pearles but states this is not helping with the cough. He came in today primarily concerned about a high pitched whistle that he heard last night around 2:30am. Stated that his chest felt like he was breathing through a pin-hole at that time. This has since improved. He has been using his duoneb, with last tx at 6:00am this morning. Pt does not smoke and denies respiratory issues apart from exercise induced asthma as a kid. Of note, pt did return home from [place] one month ago.   Wheezing   Past Medical History:  Diagnosis Date   Abnormal colonoscopy 08/06/2021   Colon polyp    Dyslipidemia 01/30/2018   Environmental allergies 06/22/2012   Epistaxis 06/14/2020   Essential hypertension 01/12/2019   Exercise-induced asthma 01/30/2018    Hemorrhoids 08/06/2021   History of migraine headaches 06/22/2012   Hx of adenomatous colonic polyps 08/06/2021   Influenza    Influenza-like illness 01/11/2022   Lower abdominal pain 09/12/2020   Metatarsalgia of both feet 06/27/2021   Migraines    Mitral valve prolapse 09/05/2016   PVC's (premature ventricular contractions) 09/05/2016   Rectal bleeding 08/06/2021   Tinea pedis 05/09/2020   Tubular adenoma of rectum 08/06/2021    Patient Active Problem List   Diagnosis Date Noted   Left inguinal hernia 02/23/2023   Skin tag of anus 02/23/2023   Influenza 10/04/2022   Influenza-like illness 01/11/2022   Colon polyp 10/18/2021   Rectal bleeding 08/06/2021   Hemorrhoids 08/06/2021   Abnormal colonoscopy 08/06/2021   Hx of adenomatous colonic polyps 08/06/2021   Tubular adenoma of rectum 08/06/2021   Metatarsalgia of both feet 06/27/2021   Migraines    Lower abdominal pain 09/12/2020   Epistaxis 06/14/2020   Tinea pedis 05/09/2020   Exercise-induced asthma 01/30/2018   Dyslipidemia 01/30/2018   PVC's (premature ventricular contractions) 09/05/2016   Mitral valve prolapse 09/05/2016   Environmental allergies 06/22/2012   History of migraine headaches 06/22/2012    Past Surgical History:  Procedure Laterality Date   BIOPSY  10/03/2021   Procedure: BIOPSY;  Surgeon: Lemar Lofty., MD;  Location: [facility];  Service: Gastroenterology;;   COLONOSCOPY     COLONOSCOPY  WITH PROPOFOL N/A 10/03/2021   Procedure: COLONOSCOPY WITH PROPOFOL;  Surgeon: Meridee Score Netty Starring., MD;  Location: [facility];  Service: Gastroenterology;  Laterality: N/A;   ENDOSCOPIC MUCOSAL RESECTION N/A 10/03/2021   Procedure: ENDOSCOPIC MUCOSAL RESECTION;  Surgeon: Meridee Score Netty Starring., MD;  Location: [facility];  Service: Gastroenterology;  Laterality: N/A;   WISDOM TOOTH EXTRACTION         Home Medications    Prior to Admission medications   Medication Sig Start Date End Date  Taking? Authorizing Provider  albuterol (PROVENTIL) (2.5 MG/3ML) 0.083% nebulizer solution Take 3 mLs (2.5 mg total) by nebulization every 4 (four) hours as needed for wheezing or shortness of breath. 05/26/23  Yes Neilan Rizzo L, PA  atorvastatin (LIPITOR) 10 MG tablet Take 1 tablet (10 mg total) by mouth daily. 01/21/23 01/16/24 Yes Revankar, Aundra Dubin, MD  chlorpheniramine-HYDROcodone (TUSSIONEX) 10-8 MG/5ML Take 5 mLs by mouth every 12 (twelve) hours as needed for cough. 05/26/23  Yes Trevian Hayashida L, PA  meloxicam (MOBIC) 15 MG tablet One tab PO every 24 hours with a meal for 2 weeks, then once every 24 hours prn pain. 02/13/23  Yes Monica Becton, MD  moxifloxacin (AVELOX) 400 MG tablet Take 1 tablet (400 mg total) by mouth daily at 8 pm for 7 days. 05/26/23 06/02/23 Yes Kalvin Buss L, PA  Multiple Vitamin (MULTIVITAMIN WITH MINERALS) TABS tablet Take 1 tablet by mouth daily.   Yes [provider]  Omega-3 Fatty Acids (FISH OIL) 1000 MG CAPS Take 1,000 mg by mouth daily.   Yes [provider]    Family History Family History  Problem Relation Age of Onset   Hypercholesterolemia Mother    Cancer Mother        Endometrial cancer   Hypertension Mother    Diabetes Father    Colon polyps Father    Irritable bowel syndrome Father    Heart disease Paternal Grandfather    Diabetes Paternal Grandfather        borderline   Colon cancer Neg Hx    Esophageal cancer Neg Hx    Pancreatic cancer Neg Hx    Stomach cancer Neg Hx    Inflammatory bowel disease Neg Hx    Liver disease Neg Hx    Rectal cancer Neg Hx     Social History Social History   Tobacco Use   Smoking status: Never   Smokeless tobacco: Never  Vaping Use   Vaping Use: Never used  Substance Use Topics   Alcohol use: Yes    Comment: 2 per week   Drug use: No     Allergies   Minocycline and Lisinopril   Review of Systems Review of Systems  Respiratory:  Positive for wheezing.   As per  HPI   Physical Exam Triage Vital Signs ED Triage Vitals  Enc Vitals Group     BP 05/26/23 0956 110/77     Pulse Rate 05/26/23 0956 89     Resp 05/26/23 0956 18     Temp 05/26/23 0956 98 F (36.7 C)     Temp Source 05/26/23 0956 Oral     SpO2 05/26/23 0956 97 %     Weight 05/26/23 0958 213 lb (96.6 kg)     Height 05/26/23 0958 6\' 7"  (2.007 m)     Head Circumference --      Peak Flow --      Pain Score 05/26/23 0958 3     Pain Loc --  Pain Edu? --      Excl. in GC? --    No data found.  Updated Vital Signs BP 110/77 (BP Location: Right Arm)   Pulse 89   Temp 98 F (36.7 C) (Oral)   Resp 18   Ht 6\' 7"  (2.007 m)   Wt 213 lb (96.6 kg)   SpO2 97%   BMI 24.00 kg/m   Visual Acuity Right Eye Distance:   Left Eye Distance:   Bilateral Distance:    Right Eye Near:   Left Eye Near:    Bilateral Near:     Physical Exam Vitals and nursing note reviewed.  Constitutional:      General: He is not in acute distress.    Appearance: Normal appearance. He is well-developed. He is ill-appearing. He is not toxic-appearing or diaphoretic.  HENT:     Head: Normocephalic and atraumatic.     Right Ear: Tympanic membrane, ear canal and external ear normal. There is no impacted cerumen.     Left Ear: Tympanic membrane, ear canal and external ear normal. There is no impacted cerumen.     Nose: Nose normal. No congestion or rhinorrhea.     Mouth/Throat:     Mouth: Mucous membranes are moist.     Pharynx: Oropharynx is clear. No oropharyngeal exudate or posterior oropharyngeal erythema.  Eyes:     General: No scleral icterus.       Right eye: No discharge.        Left eye: No discharge.     Extraocular Movements: Extraocular movements intact.     Conjunctiva/sclera: Conjunctivae normal.     Pupils: Pupils are equal, round, and reactive to light.  Cardiovascular:     Rate and Rhythm: Normal rate and regular rhythm.     Heart sounds: No murmur heard. Pulmonary:     Effort:  Pulmonary effort is normal. No respiratory distress.     Breath sounds: Wheezing (generalized, all lung fields posteriorly) and rhonchi (coarse crackles to RUL and LLL) present. No rales.  Chest:     Chest wall: No tenderness.  Musculoskeletal:        General: No swelling.     Cervical back: Normal range of motion and neck supple. No rigidity or tenderness.     Right lower leg: No edema.     Left lower leg: No edema.  Lymphadenopathy:     Cervical: No cervical adenopathy.  Skin:    General: Skin is warm and dry.     Capillary Refill: Capillary refill takes less than 2 seconds.     Coloration: Skin is not jaundiced.     Findings: No bruising, erythema or rash.  Neurological:     General: No focal deficit present.     Mental Status: He is alert and oriented to person, place, and time.  Psychiatric:        Mood and Affect: Mood normal.     UC Treatments / Results  Labs (all labs ordered are listed, but only abnormal results are displayed) Labs Reviewed  CBC WITH DIFFERENTIAL/PLATELET  BASIC METABOLIC PANEL    EKG   Radiology No results found.  Procedures Procedures (including critical care time)  Medications Ordered in UC Medications  cefTRIAXone (ROCEPHIN) injection 1,000 mg (1,000 mg Intramuscular Given 05/26/23 1041)    Initial Impression / Assessment and Plan / UC Course  I have reviewed the triage vital signs and the nursing notes.  Pertinent labs & imaging results that were available  during my care of the patient were reviewed by me and considered in my medical decision making (see chart for details).     Multilobular pneumonia - switch to avelox as pt is having worsening sx on his current dual agent therapy. Will also recheck CBC and BMP. WBC count was 6.6 while in hospital. VSS, O2 97% (was 89% in hospital). Take OTC mucinex with plenty of water. Pt has follow up with PCP on 05/29/23.  Mild intermittent asthma with exacerbation - pt does have generalized  wheezing. I suspect asthma exacerbation in addition to pneumonia. Given contraindication with PO steroids and avelox, will change to plain albuterol nebs and have pt schedule this every 4 hours around the clock. Pt has follow up on 05/29/23, could consider PO montelukast if additional agent needed. Pt given PO tussionex to help with cough and hernia pain.   Final Clinical Impressions(s) / UC Diagnoses   Final diagnoses:  Pneumonia of both lungs due to Mycoplasma pneumoniae, unspecified part of lung  Mild intermittent asthma with exacerbation     Discharge Instructions      Please stop your current antibiotics and switch to Avelox (Moxifloxacin) which is a stronger antibiotic. Please read all black box warnings associated with this medication. If you develop tendon pain, stop the medication and contact us immediately. Drink plenty of water while taking this antibiotic. You were given an antibiotic injection in our office.  Please use your albuterol neb every 4 hours around the clock for the next 24 hours. If improvement noted, then increase to every 6 hours.  Stop the tessalon pearles and start using Tussionex. This does contain a narcotic, so it may make you drowsy or dizzy. Do not take any additional narcotic pills while using this medication.  Please keep your follow up with Dr. Ashley Royalty on Thursday. You can discuss with him at that time if you would benefit from oral prednisone to help with your suspected asthma flare, but we cannot have you take this medication while on the Avelox.  We drew your CBC today to assess your white count. Your results will be available on MyChart.  If you develop any chest pain, or worsening tightness in the chest, please report back to the ER.       ED Prescriptions     Medication Sig Dispense Auth. Provider   moxifloxacin (AVELOX) 400 MG tablet Take 1 tablet (400 mg total) by mouth daily at 8 pm for 7 days. 7 tablet Huriel Matt L, PA    chlorpheniramine-HYDROcodone (TUSSIONEX) 10-8 MG/5ML Take 5 mLs by mouth every 12 (twelve) hours as needed for cough. 100 mL Humzah Harty L, PA   albuterol (PROVENTIL) (2.5 MG/3ML) 0.083% nebulizer solution Take 3 mLs (2.5 mg total) by nebulization every 4 (four) hours as needed for wheezing or shortness of breath. 120 mL Lamont Glasscock L, PA      I have reviewed the PDMP during this encounter.   Maretta Bees, [place] 05/26/23 1820

## 2023-05-26 NOTE — Discharge Instructions (Addendum)
Please stop your current antibiotics and switch to Avelox (Moxifloxacin) which is a stronger antibiotic. Please read all black box warnings associated with this medication. If you develop tendon pain, stop the medication and contact us immediately. Drink plenty of water while taking this antibiotic. You were given an antibiotic injection in our office.  Please use your albuterol neb every 4 hours around the clock for the next 24 hours. If improvement noted, then increase to every 6 hours.  Stop the tessalon pearles and start using Tussionex. This does contain a narcotic, so it may make you drowsy or dizzy. Do not take any additional narcotic pills while using this medication.  Please keep your follow up with Dr. Ashley Royalty on Thursday. You can discuss with him at that time if you would benefit from oral prednisone to help with your suspected asthma flare, but we cannot have you take this medication while on the Avelox.  We drew your CBC today to assess your white count. Your results will be available on MyChart.  If you develop any chest pain, or worsening tightness in the chest, please report back to the ER.

## 2023-05-26 NOTE — ED Triage Notes (Signed)
Patient states that he was recently hospitalized for pneumonia.  Patient has been taken all of his medications given.  This morning around 2:30am he was having difficulty breathing, cough, body hurts.

## 2023-05-27 LAB — CBC WITH DIFFERENTIAL/PLATELET
Basophils Absolute: 0.1 10*3/uL (ref 0.0–0.2)
Basos: 1 %
EOS (ABSOLUTE): 0.3 10*3/uL (ref 0.0–0.4)
Eos: 2 %
Hematocrit: 44.1 % (ref 37.5–51.0)
Hemoglobin: 14.6 g/dL (ref 13.0–17.7)
Immature Grans (Abs): 0.1 10*3/uL (ref 0.0–0.1)
Immature Granulocytes: 1 %
Lymphocytes Absolute: 1.3 10*3/uL (ref 0.7–3.1)
Lymphs: 13 %
MCH: 30.5 pg (ref 26.6–33.0)
MCHC: 33.1 g/dL (ref 31.5–35.7)
MCV: 92 fL (ref 79–97)
Monocytes Absolute: 0.6 10*3/uL (ref 0.1–0.9)
Monocytes: 6 %
Neutrophils Absolute: 7.9 10*3/uL — ABNORMAL HIGH (ref 1.4–7.0)
Neutrophils: 77 %
Platelets: 423 10*3/uL (ref 150–450)
RBC: 4.78 x10E6/uL (ref 4.14–5.80)
RDW: 11.8 % (ref 11.6–15.4)
WBC: 10.3 10*3/uL (ref 3.4–10.8)

## 2023-05-27 LAB — BASIC METABOLIC PANEL
BUN/Creatinine Ratio: 20 (ref 9–20)
BUN: 22 mg/dL (ref 6–24)
CO2: 21 mmol/L (ref 20–29)
Calcium: 9.8 mg/dL (ref 8.7–10.2)
Chloride: 101 mmol/L (ref 96–106)
Creatinine, Ser: 1.12 mg/dL (ref 0.76–1.27)
Glucose: 90 mg/dL (ref 70–99)
Potassium: 4.9 mmol/L (ref 3.5–5.2)
Sodium: 140 mmol/L (ref 134–144)
eGFR: 84 mL/min/{1.73_m2} (ref >59)

## 2023-05-27 NOTE — Telephone Encounter (Signed)
LVM on pt's (verified) vm with recommendations.

## 2023-05-29 ENCOUNTER — Ambulatory Visit (INDEPENDENT_AMBULATORY_CARE_PROVIDER_SITE_OTHER): Payer: BC Managed Care – PPO | Admitting: Family Medicine

## 2023-05-29 ENCOUNTER — Encounter: Payer: Self-pay | Admitting: Family Medicine

## 2023-05-29 VITALS — BP 104/68 | HR 92 | Ht 79.0 in | Wt 213.0 lb

## 2023-05-29 DIAGNOSIS — R918 Other nonspecific abnormal finding of lung field: Secondary | ICD-10-CM | POA: Diagnosis not present

## 2023-05-29 DIAGNOSIS — R079 Chest pain, unspecified: Secondary | ICD-10-CM | POA: Diagnosis not present

## 2023-05-29 DIAGNOSIS — J4531 Mild persistent asthma with (acute) exacerbation: Secondary | ICD-10-CM

## 2023-05-29 DIAGNOSIS — J157 Pneumonia due to Mycoplasma pneumoniae: Secondary | ICD-10-CM

## 2023-05-29 DIAGNOSIS — R0781 Pleurodynia: Secondary | ICD-10-CM | POA: Diagnosis not present

## 2023-05-29 DIAGNOSIS — R0602 Shortness of breath: Secondary | ICD-10-CM | POA: Diagnosis not present

## 2023-05-29 MED ORDER — AMBULATORY NON FORMULARY MEDICATION: 0 refills | Status: DC

## 2023-05-29 MED ORDER — METHYLPREDNISOLONE SODIUM SUCC 125 MG IJ SOLR
125.0000 mg | Freq: Once | INTRAMUSCULAR | Status: AC
Start: 2023-05-29 — End: 2023-05-29
  Administered 2023-05-29: 125 mg via INTRAMUSCULAR

## 2023-05-29 MED ORDER — ACETAMINOPHEN-CODEINE 300-30 MG PO TABS: 1.0000 | ORAL_TABLET | Freq: Four times a day (QID) | ORAL | 0 refills | Status: DC | PRN

## 2023-05-29 MED ORDER — DIAZEPAM 5 MG PO TABS: 2.5000 mg | ORAL_TABLET | Freq: Two times a day (BID) | ORAL | 0 refills | Status: DC | PRN

## 2023-05-29 NOTE — Progress Notes (Signed)
Travis Li - 42 y.o. male MRN [id number]  Date of birth: 1980/12/18  Subjective Chief Complaint  Patient presents with   Hospitalization Follow-up    HPI Travis Li is a 42 y.o. male here today for hospital follow up.   He was seen initially at urgent care and diagnosed with pneumonia.  Initially started on  amoxicillin.  Symptoms continued to worsen and seen at ED at [facility] on 6/10.  Due to hypoxemia and worsening symptoms with multilobar pneumonia on CT chest,  he was admitted and given supplemental O2 and IV antibiotics.  Respiratory panel positive for mycoplasma pneumoniae.  He was treated with cefepime and azithromycin.  He was feeling better but started feeling bad again earlier this week and was seen at Urgent care in our building.  His antibiotic was changed to Avelox.  Steroid were avoided due to increased risk of tendon injury.  He was seen in the ED once again last night due to continued severe cough, wheezing and R sided chest pain.  CT negative for PE/Rib fracture. He has had continued pain and cough despite use of tussionex.  Given flexeril this morning in the ED without much relief.  He has not received any steroids during the duration of his illness and does have a history of asthma.  He does continue albuterol nebs every 4-6 hours.    ROS:  A comprehensive ROS was completed and negative except as noted per HPI  Allergies  Allergen Reactions   Minocycline Hives    Lips swell   Lisinopril Other (See Comments)    Fatigue    Past Medical History:  Diagnosis Date   Abnormal colonoscopy 08/06/2021   Colon polyp    Dyslipidemia 01/30/2018   Environmental allergies 06/22/2012   Epistaxis 06/14/2020   Essential hypertension 01/12/2019   Exercise-induced asthma 01/30/2018   Hemorrhoids 08/06/2021   History of migraine headaches 06/22/2012   Hx of adenomatous colonic polyps 08/06/2021   Influenza    Influenza-like illness 01/11/2022   Lower abdominal pain 09/12/2020    Metatarsalgia of both feet 06/27/2021   Migraines    Mitral valve prolapse 09/05/2016   PVC's (premature ventricular contractions) 09/05/2016   Rectal bleeding 08/06/2021   Tinea pedis 05/09/2020   Tubular adenoma of rectum 08/06/2021    Past Surgical History:  Procedure Laterality Date   BIOPSY  10/03/2021   Procedure: BIOPSY;  Surgeon: Lemar Lofty., MD;  Location: [facility];  Service: Gastroenterology;;   COLONOSCOPY     COLONOSCOPY WITH PROPOFOL N/A 10/03/2021   Procedure: COLONOSCOPY WITH PROPOFOL;  Surgeon: Lemar Lofty., MD;  Location: [facility];  Service: Gastroenterology;  Laterality: N/A;   ENDOSCOPIC MUCOSAL RESECTION N/A 10/03/2021   Procedure: ENDOSCOPIC MUCOSAL RESECTION;  Surgeon: Meridee Score Netty Starring., MD;  Location: [facility];  Service: Gastroenterology;  Laterality: N/A;   WISDOM TOOTH EXTRACTION      Social History   Socioeconomic History   Marital status: Married    Spouse name: Not on file   Number of children: 1   Years of education: Not on file   Highest education level: Master's degree (e.g., MA, MS, MEng, MEd, MSW, MBA)  Occupational History   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  Tobacco Use   Smoking status: Never   Smokeless tobacco: Never  Vaping Use   Vaping Use: Never used  Substance and Sexual Activity   Alcohol use: Yes    Comment: 2 per week   Drug use: No  Sexual activity: Not on file  Other Topics Concern   Not on file  Social History Narrative   Not on file   Social Determinants of Health   Financial Resource Strain: Low Risk  (03/01/2023)   Overall Financial Resource Strain (CARDIA)    Difficulty of Paying Living Expenses: Not hard at all  Food Insecurity: No Food Insecurity (05/23/2023)   Hunger Vital Sign    Worried About Running Out of Food in the Last Year: Never true    Ran Out of Food in the Last Year: Never true  Transportation Needs: No Transportation Needs (05/23/2023)    PRAPARE - Administrator, Civil Service (Medical): No    Lack of Transportation (Non-Medical): No  Physical Activity: Unknown (03/01/2023)   Exercise Vital Sign    Days of Exercise per Week: 0 days    Minutes of Exercise per Session: Not on file  Stress: No Stress Concern Present (03/01/2023)   [employer] of Occupational Health - Occupational Stress Questionnaire    Feeling of Stress : Not at all  Social Connections: Moderately Integrated (03/01/2023)   Social Connection and Isolation Panel [NHANES]    Frequency of Communication with Friends and Family: More than three times a week    Frequency of Social Gatherings with Friends and Family: Three times a week    Attends Religious Services: Never    Active Member of Clubs or Organizations: Yes    Attends Engineer, structural: More than 4 times per year    Marital Status: Married    Family History  Problem Relation Age of Onset   Hypercholesterolemia Mother    Cancer Mother        Endometrial cancer   Hypertension Mother    Diabetes Father    Colon polyps Father    Irritable bowel syndrome Father    Heart disease Paternal Grandfather    Diabetes Paternal Grandfather        borderline   Colon cancer Neg Hx    Esophageal cancer Neg Hx    Pancreatic cancer Neg Hx    Stomach cancer Neg Hx    Inflammatory bowel disease Neg Hx    Liver disease Neg Hx    Rectal cancer Neg Hx     Health Maintenance  Topic Date Due   Hepatitis C Screening  Never done   DTaP/Tdap/Td (2 - Td or Tdap) 07/15/2021   COVID-19 Vaccine (3 - Moderna risk series) 03/08/2024 (Originally 04/09/2020)   INFLUENZA VACCINE  07/10/2023   Colonoscopy  10/03/2024   HIV Screening  Completed   HPV VACCINES  Aged Out     ----------------------------------------------------------------------------------------------------------------------------------------------------------------------------------------------------------------- Physical  Exam BP 104/68 (BP Location: Right Arm, Patient Position: Sitting, Cuff Size: Large)   Pulse 92   Ht 6\' 7"  (2.007 m)   Wt 213 lb (96.6 kg)   SpO2 92%   BMI 24.00 kg/m   Physical Exam Constitutional:      Appearance: Normal appearance. He is ill-appearing.  HENT:     Head: Normocephalic and atraumatic.  Eyes:     General: No scleral icterus. Cardiovascular:     Rate and Rhythm: Normal rate and regular rhythm.  Pulmonary:     Breath sounds: Wheezing and rhonchi present.  Chest:     Chest wall: Tenderness (R sided) present.  Neurological:     General: No focal deficit present.     Mental Status: He is alert.  Psychiatric:  Mood and Affect: Mood normal.        Behavior: Behavior normal.     ------------------------------------------------------------------------------------------------------------------------------------------------------------------------------------------------------------------- Assessment and Plan  Pneumonia of left upper lobe due to Mycoplasma pneumoniae Slight improvement since starting Avelox. He will complete course of this.  He has not received any steroids during his illness and given his wheezing, oxygen desaturation and continued symptoms I had a discussion with him that I think the benefits that this would provide would outweigh the risks at this point.  Given solu-medrol 125mg  today.  May need additional burst depending on response.   Continue scheduled albuterol for the next 48 hours. Adding diazepam to help with intercostal spasm and tylenol #3 to help with pain and see if this is better as a cough Supressant.  His O2 saturations did drop to a nadir of 84% on room air during ambulation.  I do think he would benefit from supplemental oxygen at home for now.  Sending orders for DME company to deliver O2.  Return early next week if still not improving.  Seek emergency care if having worsening pain.    Meds ordered this encounter  Medications    acetaminophen-codeine (TYLENOL #3) 300-30 MG tablet    Sig: Take 1-2 tablets by mouth every 6 (six) hours as needed for moderate pain.    Dispense:  30 tablet    Refill:  0   diazepam (VALIUM) 5 MG tablet    Sig: Take 0.5-1 tablets (2.5-5 mg total) by mouth every 12 (twelve) hours as needed for muscle spasms.    Dispense:  15 tablet    Refill:  0   AMBULATORY NON FORMULARY MEDICATION    Sig: Please provide Home Oxygen Delivery via nasal cannula  Flow: 2-3 L per minute Duration 3 months    Dispense:  1 Units    Refill:  0   methylPREDNISolone sodium succinate (SOLU-MEDROL) 125 mg/2 mL injection 125 mg    No follow-ups on file.    This visit occurred during the SARS-CoV-2 public health emergency.  Safety protocols were in place, including screening questions prior to the visit, additional usage of staff PPE, and extensive cleaning of exam room while observing appropriate contact time as indicated for disinfecting solutions.

## 2023-05-29 NOTE — Assessment & Plan Note (Signed)
Slight improvement since starting Avelox. He will complete course of this.  He has not received any steroids during his illness and given his wheezing, oxygen desaturation and continued symptoms I had a discussion with him that I think the benefits that this would provide would outweigh the risks at this point.  Given solu-medrol 125mg  today.  May need additional burst depending on response.   Continue scheduled albuterol for the next 48 hours. Adding diazepam to help with intercostal spasm and tylenol #3 to help with pain and see if this is better as a cough Supressant.  His O2 saturations did drop to a nadir of 84% on room air during ambulation.  I do think he would benefit from supplemental oxygen at home for now.  Sending orders for DME company to deliver O2.  Return early next week if still not improving.  Seek emergency care if having worsening pain.

## 2023-05-29 NOTE — Progress Notes (Signed)
Walking O2 & Pulse:  0 min = 88%; 90 bpm 1 min = 90%; 100 bpm 2 min = 85%; 94 bpm 3 min = 84%; 91 bpm Stop = 85%; 95 bpm

## 2023-05-29 NOTE — Patient Instructions (Signed)
You were given an injection of solu-medrol today-A steroid  Do not use cyclobenzaprine- Try diazepam 1/2-1 tab every 12 hours for muscle spasm  Try tylenol #3 for pain and cough-  You can use this every 6 hours.    Contact clinic if having new/worsening symptoms.

## 2023-05-30 ENCOUNTER — Telehealth: Payer: Self-pay

## 2023-05-30 DIAGNOSIS — J4531 Mild persistent asthma with (acute) exacerbation: Secondary | ICD-10-CM

## 2023-05-30 MED ORDER — AMBULATORY NON FORMULARY MEDICATION
0 refills | Status: DC
Start: 2023-05-30 — End: 2023-06-02

## 2023-05-30 NOTE — Telephone Encounter (Signed)
[employer] called and state they need the frequency for the oxygen order faxed to them.   [phone]

## 2023-05-30 NOTE — Telephone Encounter (Signed)
Faxed updated Rx with Dr. Ashley Royalty instructions.

## 2023-05-31 ENCOUNTER — Inpatient Hospital Stay (HOSPITAL_COMMUNITY)
Admission: EM | Admit: 2023-05-31 | Discharge: 2023-06-02 | DRG: 202 | Disposition: A | Discharge status: Home / Self Care | Payer: BC Managed Care – PPO | Attending: Internal Medicine | Admitting: Internal Medicine

## 2023-05-31 ENCOUNTER — Other Ambulatory Visit: Payer: Self-pay

## 2023-05-31 ENCOUNTER — Encounter (HOSPITAL_COMMUNITY): Payer: Self-pay | Admitting: Internal Medicine

## 2023-05-31 ENCOUNTER — Emergency Department (HOSPITAL_COMMUNITY): Payer: BC Managed Care – PPO

## 2023-05-31 ENCOUNTER — Ambulatory Visit
Admission: EM | Admit: 2023-05-31 | Discharge: 2023-05-31 | Disposition: A | Discharge status: Home / Self Care | Payer: BC Managed Care – PPO

## 2023-05-31 DIAGNOSIS — R059 Cough, unspecified: Secondary | ICD-10-CM | POA: Diagnosis not present

## 2023-05-31 DIAGNOSIS — Z83438 Family history of other disorder of lipoprotein metabolism and other lipidemia: Secondary | ICD-10-CM

## 2023-05-31 DIAGNOSIS — Z8719 Personal history of other diseases of the digestive system: Secondary | ICD-10-CM | POA: Diagnosis not present

## 2023-05-31 DIAGNOSIS — J9601 Acute respiratory failure with hypoxia: Principal | ICD-10-CM

## 2023-05-31 DIAGNOSIS — E785 Hyperlipidemia, unspecified: Secondary | ICD-10-CM | POA: Diagnosis not present

## 2023-05-31 DIAGNOSIS — J4551 Severe persistent asthma with (acute) exacerbation: Secondary | ICD-10-CM | POA: Diagnosis not present

## 2023-05-31 DIAGNOSIS — Z888 Allergy status to other drugs, medicaments and biological substances status: Secondary | ICD-10-CM

## 2023-05-31 DIAGNOSIS — Z8619 Personal history of other infectious and parasitic diseases: Secondary | ICD-10-CM | POA: Diagnosis not present

## 2023-05-31 DIAGNOSIS — J157 Pneumonia due to Mycoplasma pneumoniae: Secondary | ICD-10-CM | POA: Diagnosis present

## 2023-05-31 DIAGNOSIS — J45901 Unspecified asthma with (acute) exacerbation: Secondary | ICD-10-CM | POA: Diagnosis not present

## 2023-05-31 DIAGNOSIS — Z8249 Family history of ischemic heart disease and other diseases of the circulatory system: Secondary | ICD-10-CM | POA: Diagnosis not present

## 2023-05-31 DIAGNOSIS — R0603 Acute respiratory distress: Secondary | ICD-10-CM | POA: Diagnosis not present

## 2023-05-31 DIAGNOSIS — Z83719 Family history of colon polyps, unspecified: Secondary | ICD-10-CM

## 2023-05-31 DIAGNOSIS — I341 Nonrheumatic mitral (valve) prolapse: Secondary | ICD-10-CM | POA: Diagnosis not present

## 2023-05-31 DIAGNOSIS — G43909 Migraine, unspecified, not intractable, without status migrainosus: Secondary | ICD-10-CM | POA: Diagnosis not present

## 2023-05-31 DIAGNOSIS — Z881 Allergy status to other antibiotic agents status: Secondary | ICD-10-CM | POA: Diagnosis not present

## 2023-05-31 DIAGNOSIS — Z79899 Other long term (current) drug therapy: Secondary | ICD-10-CM | POA: Diagnosis not present

## 2023-05-31 DIAGNOSIS — J180 Bronchopneumonia, unspecified organism: Secondary | ICD-10-CM | POA: Diagnosis not present

## 2023-05-31 DIAGNOSIS — I10 Essential (primary) hypertension: Secondary | ICD-10-CM | POA: Diagnosis not present

## 2023-05-31 DIAGNOSIS — K429 Umbilical hernia without obstruction or gangrene: Secondary | ICD-10-CM | POA: Diagnosis present

## 2023-05-31 DIAGNOSIS — R0902 Hypoxemia: Secondary | ICD-10-CM | POA: Diagnosis not present

## 2023-05-31 DIAGNOSIS — J4541 Moderate persistent asthma with (acute) exacerbation: Secondary | ICD-10-CM | POA: Diagnosis not present

## 2023-05-31 DIAGNOSIS — Z86010 Personal history of colonic polyps: Secondary | ICD-10-CM

## 2023-05-31 LAB — CBC WITH DIFFERENTIAL/PLATELET
Abs Immature Granulocytes: 0.06 10*3/uL (ref 0.00–0.07)
Basophils Absolute: 0 10*3/uL (ref 0.0–0.1)
Basophils Relative: 0 %
Eosinophils Absolute: 0.3 10*3/uL (ref 0.0–0.5)
Eosinophils Relative: 3 %
HCT: 39.9 % (ref 39.0–52.0)
Hemoglobin: 13.4 g/dL (ref 13.0–17.0)
Immature Granulocytes: 1 %
Lymphocytes Relative: 15 %
Lymphs Abs: 1.5 10*3/uL (ref 0.7–4.0)
MCH: 32.7 pg (ref 26.0–34.0)
MCHC: 33.6 g/dL (ref 30.0–36.0)
MCV: 97.3 fL (ref 80.0–100.0)
Monocytes Absolute: 0.6 10*3/uL (ref 0.1–1.0)
Monocytes Relative: 6 %
Neutro Abs: 7.4 10*3/uL (ref 1.7–7.7)
Neutrophils Relative %: 75 %
Platelets: 363 10*3/uL (ref 150–400)
RBC: 4.1 MIL/uL — ABNORMAL LOW (ref 4.22–5.81)
RDW: 11.9 % (ref 11.5–15.5)
WBC: 9.9 10*3/uL (ref 4.0–10.5)
nRBC: 0 % (ref 0.0–0.2)

## 2023-05-31 LAB — RESPIRATORY PANEL BY PCR

## 2023-05-31 LAB — COMPREHENSIVE METABOLIC PANEL
ALT: 41 U/L (ref 0–44)
AST: 15 U/L (ref 15–41)
Albumin: 3.6 g/dL (ref 3.5–5.0)
Alkaline Phosphatase: 50 U/L (ref 38–126)
Anion gap: 10 (ref 5–15)
BUN: 22 mg/dL — ABNORMAL HIGH (ref 6–20)
CO2: 26 mmol/L (ref 22–32)
Calcium: 8.8 mg/dL — ABNORMAL LOW (ref 8.9–10.3)
Chloride: 101 mmol/L (ref 98–111)
Creatinine, Ser: 1.17 mg/dL (ref 0.61–1.24)
GFR, Estimated: >60 mL/min (ref >60)
Glucose, Bld: 88 mg/dL (ref 70–99)
Potassium: 3.6 mmol/L (ref 3.5–5.1)
Sodium: 137 mmol/L (ref 135–145)
Total Bilirubin: 0.6 mg/dL (ref 0.3–1.2)
Total Protein: 7 g/dL (ref 6.5–8.1)

## 2023-05-31 MED ORDER — REVEFENACIN 175 MCG/3ML IN SOLN
175.0000 ug | Freq: Every day | RESPIRATORY_TRACT | Status: DC
  Administered 2023-06-01 – 2023-06-02 (×2): 175 ug via RESPIRATORY_TRACT
  Filled 2023-05-31 (×2): qty 3

## 2023-05-31 MED ORDER — SODIUM CHLORIDE 0.9% FLUSH
3.0000 mL | Freq: Two times a day (BID) | INTRAVENOUS | Status: DC
  Administered 2023-05-31 – 2023-06-02 (×4): 3 mL via INTRAVENOUS

## 2023-05-31 MED ORDER — ACETAMINOPHEN 325 MG PO TABS: 650.0000 mg | ORAL_TABLET | Freq: Four times a day (QID) | ORAL | Status: DC | PRN

## 2023-05-31 MED ORDER — IPRATROPIUM-ALBUTEROL 0.5-2.5 (3) MG/3ML IN SOLN: 3.0000 mL | RESPIRATORY_TRACT | Status: DC | PRN

## 2023-05-31 MED ORDER — DEXTROMETHORPHAN POLISTIREX ER 30 MG/5ML PO SUER
60.0000 mg | Freq: Two times a day (BID) | ORAL | Status: DC
  Administered 2023-05-31 – 2023-06-02 (×5): 60 mg via ORAL
  Filled 2023-05-31 (×5): qty 10

## 2023-05-31 MED ORDER — METHYLPREDNISOLONE SODIUM SUCC 125 MG IJ SOLR
125.0000 mg | Freq: Once | INTRAMUSCULAR | Status: AC
  Administered 2023-05-31: 125 mg via INTRAVENOUS
  Filled 2023-05-31: qty 2

## 2023-05-31 MED ORDER — BUDESONIDE 0.25 MG/2ML IN SUSP
0.2500 mg | Freq: Two times a day (BID) | RESPIRATORY_TRACT | Status: DC
  Administered 2023-05-31 – 2023-06-02 (×5): 0.25 mg via RESPIRATORY_TRACT
  Filled 2023-05-31 (×5): qty 2

## 2023-05-31 MED ORDER — SODIUM CHLORIDE 0.9 % IV BOLUS
1000.0000 mL | Freq: Once | INTRAVENOUS | Status: AC
  Administered 2023-05-31: 1000 mL via INTRAVENOUS

## 2023-05-31 MED ORDER — METHYLPREDNISOLONE SODIUM SUCC 125 MG IJ SOLR
125.0000 mg | Freq: Every day | INTRAMUSCULAR | Status: DC
  Administered 2023-06-01 – 2023-06-02 (×2): 125 mg via INTRAVENOUS
  Filled 2023-05-31 (×2): qty 2

## 2023-05-31 MED ORDER — IPRATROPIUM-ALBUTEROL 0.5-2.5 (3) MG/3ML IN SOLN
3.0000 mL | Freq: Once | RESPIRATORY_TRACT | Status: AC
  Administered 2023-05-31: 3 mL via RESPIRATORY_TRACT
  Filled 2023-05-31: qty 3

## 2023-05-31 MED ORDER — ACETAMINOPHEN 650 MG RE SUPP: 650.0000 mg | Freq: Four times a day (QID) | RECTAL | Status: DC | PRN

## 2023-05-31 MED ORDER — LEVOFLOXACIN 750 MG PO TABS
750.0000 mg | ORAL_TABLET | Freq: Every day | ORAL | Status: AC
  Administered 2023-05-31 – 2023-06-01 (×2): 750 mg via ORAL
  Filled 2023-05-31 (×2): qty 1

## 2023-05-31 MED ORDER — HYDROCOD POLI-CHLORPHE POLI ER 10-8 MG/5ML PO SUER
5.0000 mL | Freq: Two times a day (BID) | ORAL | Status: DC
  Administered 2023-05-31 – 2023-06-02 (×5): 5 mL via ORAL
  Filled 2023-05-31 (×5): qty 5

## 2023-05-31 MED ORDER — ARFORMOTEROL TARTRATE 15 MCG/2ML IN NEBU
15.0000 ug | INHALATION_SOLUTION | Freq: Two times a day (BID) | RESPIRATORY_TRACT | Status: DC
  Administered 2023-05-31 – 2023-06-02 (×5): 15 ug via RESPIRATORY_TRACT
  Filled 2023-05-31 (×5): qty 2

## 2023-05-31 NOTE — Assessment & Plan Note (Addendum)
-   may be having some exacerbation of asthma now in setting of post-infectious cough and recent Mycoplasma infection - difficulty with symptomatic control outpatient esp now with some hypoxia due to exacerbation -Follow-up RVP -Pulmonology also consulted from the ER - Continue pupillary, Brovana, budesonide - DuoNebs as needed - Also adding on Tussionex and Delsym for some cough suppression -Continuing IV steroids per pulmonology recommendations then prednisone taper at discharge

## 2023-05-31 NOTE — ED Notes (Signed)
While talking to pt during assessment, pts 02 dropped to 89%. Pt was placed on 2 liters [place].

## 2023-05-31 NOTE — Hospital Course (Signed)
Travis Li is a 42 yo male with exercise induced asthma, HLD, HTN, MVP, recent hernia repair (s/p robotic B/L inguinal hernia repair w/ mesh and open primary umbilical hernia repair) on 04/29/23.  He had recent follow-up with general surgery on 05/29/2023 and was considered doing well.  Ongoing weight lifting restriction was continued otherwise was doing well from a postop standpoint.  He has also been recently hospitalized from 05/19/2023 until 05/22/2023 due to mycoplasma pneumonia of LUL.  He was discharged on the 13th with ongoing course of azithromycin and cefdinir for 5 more days. He has had difficulty recovering with lingering severe cough and intermittent oxygen desaturations into the upper 80s due to severe coughing. He presented to urgent care on 05/26/2023 and was prescribed 7-day course of Avelox.  He then followed up with primary care on 05/29/2023 and also given Solu-Medrol 125 mg along with course of Tylenol 3 and Valium. He has continued to have ongoing coughing with intermittent mild yellow sputum production.  He denies any fevers.  Due to persistent cough, he presented for further evaluation.  In the ER he was found to have some mild desaturation into the upper 80s on room air during his coughing spells. He was treated with repeat dose of Solu-Medrol and nebulizers and recommended for admission for further evaluation.  Pulmonology was also consulted.

## 2023-05-31 NOTE — Assessment & Plan Note (Signed)
-   recent hospitalization 6/10 - 6/13 with LUL pna; discharged on Rocephin and azithromycin for 5 days; then was started on 7-day course of Avelox on 05/26/2023 -Continue on 2 more days of Levaquin to complete 7-day course which should be more than sufficient for the infection; course completed inpatient

## 2023-05-31 NOTE — ED Provider Notes (Signed)
[facility] EM[facility]ENC[facility] DEPARTMENT AT [facility] Provider Note   CSN: [id number] Arrival date & time: 05/31/23  1003     History  Chief Complaint  Patient presents with   Shortness of Breath    Travis Li is a 42 y.o. male.  HPI 42 year old male with a exercise-induced asthma presents with cough and shortness of breath.  Patient originally started doing poorly with cough a little over 2 weeks ago.  Ultimately went to the hospital and was admitted for multiple days with pneumonia and was treated with azithromycin upon discharge.  Was also given nebulizer treatments.  Had to go back to urgent care 5 days ago and was put on moxifloxacin for continued cough and shortness of breath.  Over the past couple days he has developed right-sided pleuritic chest pain that seems to be a muscular pain according to him and wife as they went to [facility] and had a CTA that showed no PE.  He was given a dose of steroids the next morning by his doctor and that was the best he felt but then now is having further worsening of symptoms and went to urgent care again this morning and was referred here.  He states he is having wheezing and cough but the primary concern is how much cough he is having with no sputum production as well as some shortness of breath.  The pain is there but not as bad currently.  Home Medications Prior to Admission medications   Medication Sig Start Date End Date Taking? Authorizing Provider  acetaminophen-codeine (TYLENOL #3) 300-30 MG tablet Take 1-2 tablets by mouth every 6 (six) hours as needed for moderate pain. 05/29/23  Yes Everrett Coombe, DO  albuterol (PROVENTIL) (2.5 MG/3ML) 0.083% nebulizer solution Take 3 mLs (2.5 mg total) by nebulization every 4 (four) hours as needed for wheezing or shortness of breath. 05/26/23  Yes Crain, Whitney L, PA  atorvastatin (LIPITOR) 10 MG tablet Take 1 tablet (10 mg total) by mouth daily. 01/21/23 01/16/24 Yes Revankar, Aundra Dubin, MD  diazepam  (VALIUM) 5 MG tablet Take 0.5-1 tablets (2.5-5 mg total) by mouth every 12 (twelve) hours as needed for muscle spasms. 05/29/23  Yes Everrett Coombe, DO  ipratropium-albuterol (DUONEB) 0.5-2.5 (3) MG/3ML SOLN Take 3 mLs by nebulization every 6 (six) hours as needed (wheezing/shortness of breath). 05/22/23  Yes [provider]  moxifloxacin (AVELOX) 400 MG tablet Take 1 tablet (400 mg total) by mouth daily at 8 pm for 7 days. 05/26/23 06/02/23 Yes Crain, Whitney L, PA  Multiple Vitamin (MULTIVITAMIN WITH MINERALS) TABS tablet Take 1 tablet by mouth daily.   Yes [provider]  Omega-3 Fatty Acids (FISH OIL) 1000 MG CAPS Take 1,000 mg by mouth daily.   Yes [provider]  AMBULATORY NON FORMULARY MEDICATION Please provide Home Oxygen Delivery via nasal cannula  Flow: 2-3 L per minute Frequency: Continuous Duration 3 months 05/30/23   Everrett Coombe, DO  amoxicillin (AMOXIL) 500 MG capsule Take by mouth. Patient not taking: Reported on 05/31/2023 05/18/23   [provider]  meloxicam (MOBIC) 15 MG tablet One tab PO every 24 hours with a meal for 2 weeks, then once every 24 hours prn pain. Patient not taking: Reported on 05/31/2023 02/13/23   Monica Becton, MD  methocarbamol (ROBAXIN) 500 MG tablet Take 500 mg by mouth 3 (three) times daily. Patient not taking: Reported on 05/31/2023 04/29/23   [provider]  oxyCODONE (OXY IR/ROXICODONE) 5 MG immediate release  tablet Take by mouth. Patient not taking: Reported on 05/31/2023 04/29/23   [provider]      Allergies    Minocycline and Lisinopril    Review of Systems   Review of Systems  Constitutional:  Negative for fever (earlier in the course, none now).  Respiratory:  Positive for cough, shortness of breath and wheezing.   Cardiovascular:  Positive for chest pain.  Gastrointestinal:  Negative for abdominal pain.    Physical Exam Updated Vital Signs BP 119/71 (BP Location: Right Arm)    Pulse 88   Temp 98.2 F (36.8 C) (Oral)   Resp 20   Ht 6\' 7"  (2.007 m)   Wt 96.6 kg   SpO2 98%   BMI 24.00 kg/m  Physical Exam Vitals and nursing note reviewed.  Constitutional:      Appearance: He is well-developed. He is not ill-appearing or diaphoretic.  HENT:     Head: Normocephalic and atraumatic.  Cardiovascular:     Rate and Rhythm: Normal rate and regular rhythm.     Heart sounds: Normal heart sounds.  Pulmonary:     Effort: Pulmonary effort is normal. No accessory muscle usage or respiratory distress.     Breath sounds: Wheezing (diffuse, expiratory) present.  Chest:     Chest wall: Tenderness (right chest) present.  Abdominal:     Palpations: Abdomen is soft.     Tenderness: There is no abdominal tenderness.  Skin:    General: Skin is warm and dry.  Neurological:     Mental Status: He is alert.     ED Results / Procedures / Treatments   Labs (all labs ordered are listed, but only abnormal results are displayed) Labs Reviewed  COMPREHENSIVE METABOLIC PANEL - Abnormal; Notable for the following components:      Result Value   BUN 22 (*)    Calcium 8.8 (*)    All other components within normal limits  CBC WITH DIFFERENTIAL/PLATELET - Abnormal; Notable for the following components:   RBC 4.10 (*)    All other components within normal limits  RESPIRATORY PANEL BY PCR    EKG None  Radiology DG Chest 2 View  Result Date: 05/31/2023 CLINICAL DATA:  Cough. EXAM: CHEST - 2 VIEW COMPARISON:  03/07/2022 FINDINGS: The heart size and mediastinal contours are within normal limits. Both lungs are clear. The visualized skeletal structures are unremarkable. IMPRESSION: No active cardiopulmonary disease. Electronically Signed   By: Signa Kell M.D.   On: 05/31/2023 11:38    Procedures Procedures    Medications Ordered in ED Medications  revefenacin (YUPELRI) nebulizer solution 175 mcg (175 mcg Nebulization Not Given 05/31/23 1313)  arformoterol (BROVANA)  nebulizer solution 15 mcg (15 mcg Nebulization Given 05/31/23 1308)  budesonide (PULMICORT) nebulizer solution 0.25 mg (0.25 mg Nebulization Given 05/31/23 1313)  ipratropium-albuterol (DUONEB) 0.5-2.5 (3) MG/3ML nebulizer solution 3 mL (has no administration in time range)  chlorpheniramine-HYDROcodone (TUSSIONEX) 10-8 MG/5ML suspension 5 mL (has no administration in time range)  dextromethorphan (DELSYM) 30 MG/5ML liquid 60 mg (has no administration in time range)  sodium chloride 0.9 % bolus 1,000 mL (1,000 mLs Intravenous New Bag/Given 05/31/23 1130)  methylPREDNISolone sodium succinate (SOLU-MEDROL) 125 mg/2 mL injection 125 mg (125 mg Intravenous Given 05/31/23 1130)  ipratropium-albuterol (DUONEB) 0.5-2.5 (3) MG/3ML nebulizer solution 3 mL (3 mLs Nebulization Given 05/31/23 1111)    ED Course/ Medical Decision Making/ A&P  Medical Decision Making Amount and/or Complexity of Data Reviewed External Data Reviewed: labs, radiology and notes. Labs: ordered.    Details: Normal WBC Radiology: ordered and independent interpretation performed.    Details: No PNA  Risk Prescription drug management. Decision regarding hospitalization.   Patient has significant bronchospasm and was given albuterol and a dose of Solu-Medrol.  He is feeling a lot better though still wheezy and still requiring 2 L oxygen for mild hypoxia.  Given this I think you will need admission.  Discussed with Dr. Judeth Horn of pulmonology who will consult and add recommendations.  Feels this is most likely just asthma and he will need titration of his meds.  Given he is hypoxic, will need admission for supportive care.  Discussed with Dr. Frederick Peers.        Final Clinical Impression(s) / ED Diagnoses Final diagnoses:  Acute respiratory failure with hypoxia (HCC)  Asthma with acute exacerbation, unspecified asthma severity, unspecified whether persistent    Rx / DC Orders ED Discharge Orders      None         Pricilla Loveless, MD 05/31/23 [phone]

## 2023-05-31 NOTE — Assessment & Plan Note (Addendum)
-   Saturations down into the upper 80s on room air with coughing spells -See workup above.  Continue oxygen and weaning as able -Underwent walk test on 06/01/2023 with no further desaturation

## 2023-05-31 NOTE — ED Triage Notes (Signed)
Patient reports shobr D/c from hospital one week ago for pneumonia Went to UC and was told to come to ED for low sat Work of breathing increased in triage Temp 99.2 Reports muscle pain in chest 7/10

## 2023-05-31 NOTE — ED Triage Notes (Addendum)
Pt returns here for a prednisone dose pack ' Pt was seen by Dr Ashley Royalty on Thursday & was told to return if the steroids helped Pt here w/ wife  Dry productive cough  O2 sats 90% in the waiting room  Pt states O2 is coming to house  Provider in room  Abbreviated triage due to patient status

## 2023-05-31 NOTE — Consult Note (Signed)
NAME:  Travis Li, MRN:  [id number], DOB:  1981-06-10, LOS: 0 ADMISSION DATE:  05/31/2023, CONSULTATION DATE:  05/31/23  REFERRING MD:  ED, CHIEF COMPLAINT: Cough  History of Present Illness:  42 year old man history of exercise-induced asthma and recent hospitalization for mycoplasma pneumonia community-acquired pneumonia is admitted with severe cough.  Recent H&P and discharge summary reviewed.  Multiple ED notes reviewed.  Usually asthma well-controlled.  Exercise-induced.  Has risk inhaler but rarely uses.  Often expires.  Management for many years.  Acute onset of cough and shortness of breath.  Went to urgent care.  Was diagnosed with pneumonia.  Given antibiotics.  Had worsening symptoms ultimately presented to the emergency room was admitted earlier 05/2023.  CT scan revealed scattered groundglass opacities concerning for atypical pneumonia.  He was diagnosed with mycoplasma pneumonia.  Treated.  Discharged.  Since then has had severe cough.  With deep breaths.  Associated right-sided pain with coughing, muscle soreness.  Got a dose of what sounds like IM steroids at his PCP.  Symptoms much improved for a day.  Then recurred.  He is not on a maintenance inhaler.  Given ongoing symptoms he presented to the ED today.  Chest x-ray obtained in the ED on my review interpretation reveals clear lungs bilaterally.  Reviewed CTA PE protocol 2 days prior ED visit, which reports improving groundglass opacities, no acute or worsening findings, no PE.  Pertinent  Medical History  Exercise-induced asthma  Significant Hospital Events: Including procedures, antibiotic start and stop dates in addition to other pertinent events   Admitted to the hospital 6/22 with worsening bronchitis symptoms  Interim History / Subjective:    Objective   Blood pressure 119/71, pulse 88, temperature 99.2 F (37.3 C), temperature source Oral, resp. rate 20, height 6\' 7"  (2.007 m), weight 96.6 kg, SpO2 99 %.        No intake or output data in the 24 hours ending 05/31/23 1240 Filed Weights   05/31/23 1014  Weight: 96.6 kg    Examination: General: Sitting in stretcher, in no acute distress Eyes: EOMI, no icterus Neck: Supple, JVP Lungs: Clear, frequently coughing spasms with deep breath, normal work of breathing on 2 L nasal cannula Cardiovascular: Warm, no edema Abdomen: Nondistended, bowel sounds present MSK: No synovitis, no joint effusion Neuro: No focal deficits, sensation appears intact Psych: Normal mood, full affect  Resolved Hospital Problem list     Assessment & Plan:  Severe persistent asthma with exacerbation: Prolonged symptoms after recent illness, mycoplasma pneumonia community acquired pneumonia.  CT scan recently showed improving infiltrates which is reassuring, no PE.  Sounds like he had poorly controlled symptoms for some time even preceding the CAP diagnosis. -- Roxy Manns, arformoterol for triple inhaled therapy via nebulizer -- DuoNebs as needed -- IV steroids until signs of clinical improvement then recommend a prednisone taper of 40 mg for 5 days then 20 mg for 5 days then 10 mg for 5 days then stop --Recommend discharge on Breztri 2 puffs twice daily as new maintenance inhalers given recurrent exacerbations triple inhaled therapy is warranted -- Absolute eosinophil 300 05/29/2023 at outside hospital, consider Biologics in the outpatient setting if remains poorly controlled despite initiation of controller medications  Best Practice (right click and "Reselect all SmartList Selections" daily)   Per primary  Labs   CBC: Recent Labs  Lab 05/26/23 1039 05/31/23 1105  WBC 10.3 9.9  NEUTROABS 7.9* 7.4  HGB 14.6 13.4  HCT 44.1 39.9  MCV  92 97.3  PLT 423 363    Basic Metabolic Panel: Recent Labs  Lab 05/26/23 1039 05/31/23 1105  NA 140 137  K 4.9 3.6  CL 101 101  CO2 21 26  GLUCOSE 90 88  BUN 22 22*  CREATININE 1.12 1.17  CALCIUM 9.8 8.8*    GFR: Estimated Creatinine Clearance: 107.9 mL/min (by C-G formula based on SCr of 1.17 mg/dL). Recent Labs  Lab 05/26/23 1039 05/31/23 1105  WBC 10.3 9.9    Liver Function Tests: Recent Labs  Lab 05/31/23 1105  AST 15  ALT 41  ALKPHOS 50  BILITOT 0.6  PROT 7.0  ALBUMIN 3.6   No results for input(s): "LIPASE", "AMYLASE" in the last 168 hours. No results for input(s): "AMMONIA" in the last 168 hours.  ABG No results found for: "PHART", "PCO2ART", "PO2ART", "HCO3", "TCO2", "ACIDBASEDEF", "O2SAT"   Coagulation Profile: No results for input(s): "INR", "PROTIME" in the last 168 hours.  Cardiac Enzymes: No results for input(s): "CKTOTAL", "CKMB", "CKMBINDEX", "TROPONINI" in the last 168 hours.  HbA1C: Hgb A1c MFr Bld  Date/Time Value Ref Range Status  10/23/2022 08:26 AM 5.4 4.8 - 5.6 % Final    Comment:             Prediabetes: 5.7 - 6.4          Diabetes: >6.4          Glycemic control for adults with diabetes: <7.0   10/22/2021 09:05 AM 5.4 4.8 - 5.6 % Final    Comment:             Prediabetes: 5.7 - 6.4          Diabetes: >6.4          Glycemic control for adults with diabetes: <7.0     CBG: No results for input(s): "GLUCAP" in the last 168 hours.  Review of Systems:   No orthopnea or PND.  Comprehensive review of systems otherwise negative.  Past Medical History:  He,  has a past medical history of Abnormal colonoscopy (08/06/2021), Colon polyp, Dyslipidemia (01/30/2018), Environmental allergies (06/22/2012), Epistaxis (06/14/2020), Essential hypertension (01/12/2019), Exercise-induced asthma (01/30/2018), Hemorrhoids (08/06/2021), History of migraine headaches (06/22/2012), adenomatous colonic polyps (08/06/2021), Influenza, Influenza-like illness (01/11/2022), Lower abdominal pain (09/12/2020), Metatarsalgia of both feet (06/27/2021), Migraines, Mitral valve prolapse (09/05/2016), PVC's (premature ventricular contractions) (09/05/2016), Rectal bleeding  (08/06/2021), Tinea pedis (05/09/2020), and Tubular adenoma of rectum (08/06/2021).   Surgical History:   Past Surgical History:  Procedure Laterality Date   BIOPSY  10/03/2021   Procedure: BIOPSY;  Surgeon: Meridee Score Netty Starring., MD;  Location: [facility];  Service: Gastroenterology;;   COLONOSCOPY     COLONOSCOPY WITH PROPOFOL N/A 10/03/2021   Procedure: COLONOSCOPY WITH PROPOFOL;  Surgeon: Lemar Lofty., MD;  Location: [facility];  Service: Gastroenterology;  Laterality: N/A;   ENDOSCOPIC MUCOSAL RESECTION N/A 10/03/2021   Procedure: ENDOSCOPIC MUCOSAL RESECTION;  Surgeon: Meridee Score Netty Starring., MD;  Location: [facility];  Service: Gastroenterology;  Laterality: N/A;   WISDOM TOOTH EXTRACTION       Social History:   reports that he has never smoked. He has never used smokeless tobacco. He reports current alcohol use. He reports that he does not use drugs.   Family History:  His family history includes Cancer in his mother; Colon polyps in his father; Diabetes in his father and paternal grandfather; Heart disease in his paternal grandfather; Hypercholesterolemia in his mother; Hypertension in his mother; Irritable bowel syndrome in his father. There  is no history of Colon cancer, Esophageal cancer, Pancreatic cancer, Stomach cancer, Inflammatory bowel disease, Liver disease, or Rectal cancer.   Allergies Allergies  Allergen Reactions   Minocycline Hives    Lips swell   Lisinopril Other (See Comments)    Fatigue     Home Medications  Prior to Admission medications   Medication Sig Start Date End Date Taking? Authorizing Provider  acetaminophen-codeine (TYLENOL #3) 300-30 MG tablet Take 1-2 tablets by mouth every 6 (six) hours as needed for moderate pain. 05/29/23   Everrett Coombe, DO  albuterol (PROVENTIL) (2.5 MG/3ML) 0.083% nebulizer solution Take 3 mLs (2.5 mg total) by nebulization every 4 (four) hours as needed for wheezing or shortness of breath. 05/26/23    Crain, Whitney L, PA  AMBULATORY NON FORMULARY MEDICATION Please provide Home Oxygen Delivery via nasal cannula  Flow: 2-3 L per minute Frequency: Continuous Duration 3 months 05/30/23   Everrett Coombe, DO  amoxicillin (AMOXIL) 500 MG capsule Take by mouth. 05/18/23   [provider]  atorvastatin (LIPITOR) 10 MG tablet Take 1 tablet (10 mg total) by mouth daily. 01/21/23 01/16/24  Revankar, Aundra Dubin, MD  diazepam (VALIUM) 5 MG tablet Take 0.5-1 tablets (2.5-5 mg total) by mouth every 12 (twelve) hours as needed for muscle spasms. 05/29/23   Everrett Coombe, DO  ipratropium-albuterol (DUONEB) 0.5-2.5 (3) MG/3ML SOLN Take 3 mLs by nebulization every 6 (six) hours as needed. 05/22/23   [provider]  meloxicam (MOBIC) 15 MG tablet One tab PO every 24 hours with a meal for 2 weeks, then once every 24 hours prn pain. 02/13/23   Monica Becton, MD  methocarbamol (ROBAXIN) 500 MG tablet Take 500 mg by mouth 3 (three) times daily. 04/29/23   [provider]  moxifloxacin (AVELOX) 400 MG tablet Take 1 tablet (400 mg total) by mouth daily at 8 pm for 7 days. 05/26/23 06/02/23  Guy Sandifer L, PA  Multiple Vitamin (MULTIVITAMIN WITH MINERALS) TABS tablet Take 1 tablet by mouth daily.    [provider]  Omega-3 Fatty Acids (FISH OIL) 1000 MG CAPS Take 1,000 mg by mouth daily.    [provider]  oxyCODONE (OXY IR/ROXICODONE) 5 MG immediate release tablet Take by mouth. 04/29/23   [provider]     Critical care time: n/a    Karren Burly, MD See Loretha Stapler

## 2023-05-31 NOTE — ED Provider Notes (Signed)
[facility]    CSN: [id number] Arrival date & time: 05/31/23  0901      History   Chief Complaint Chief Complaint  Patient presents with   Cough    Entered by patient    HPI Travis Li is a 42 y.o. male.   Pleasant 42 year old male presents today due to concerns of continued cough.  Patient was seen initially in emergency room and admitted on June 10.  He had a 3-day stay where he received IV antibiotics.  He was discharged home on Zithromax and cefdinir.  He then came in and saw me personally on 6/17, which was 4 days after being discharged from the hospital.  He was 1 day short of completing his antibiotics at that time, but stated at that time that his cough continued and he continued to have some discomfort in his chest.  We gave him a ceftriaxone injection and sent him home with moxifloxacin.  Patient was then seen in the emergency room at [facility] on 6/20 in which a CTA was performed.  This did not show a pulmonary embolism but showed diffuse pneumonia. A venous blood gas also showed a low O2 in the 47% range. Pt had a follow up on 6/20 with his PCP in which a steroid shot was performed. Pt states he felt okay for about 24 hours after the steroid shot, but woke up today with severe R sided rib pain, tachypnea and inability to take a deep breath. Pt states he is very short of breath. His last neb treatment was at 6am this morning. He is coughing profusely and states he is having muscle spasms due to the cough. Pt's pulse rate initially upon eval in the waiting room was close to 200. Pts O2 has been ranging between 89-95%. Pt does have a hx of asthma. His pneumonia was found to be due to Mycoplasma during his workup at his first hospitalization. Pt's son had similar sx initially, pt also recently returned from [place].    Cough   Past Medical History:  Diagnosis Date   Abnormal colonoscopy 08/06/2021   Colon polyp    Dyslipidemia 01/30/2018   Environmental  allergies 06/22/2012   Epistaxis 06/14/2020   Essential hypertension 01/12/2019   Exercise-induced asthma 01/30/2018   Hemorrhoids 08/06/2021   History of migraine headaches 06/22/2012   Hx of adenomatous colonic polyps 08/06/2021   Influenza    Influenza-like illness 01/11/2022   Lower abdominal pain 09/12/2020   Metatarsalgia of both feet 06/27/2021   Migraines    Mitral valve prolapse 09/05/2016   PVC's (premature ventricular contractions) 09/05/2016   Rectal bleeding 08/06/2021   Tinea pedis 05/09/2020   Tubular adenoma of rectum 08/06/2021    Patient Active Problem List   Diagnosis Date Noted   Pneumonia of left upper lobe due to Mycoplasma pneumoniae 05/19/2023   Hyponatremia 05/19/2023   Hypokalemia 05/19/2023   Acute respiratory failure with hypoxia (HCC) 05/19/2023   Umbilical hernia without obstruction and without gangrene 03/27/2023   Left inguinal hernia 02/23/2023   Skin tag of anus 02/23/2023   Influenza 10/04/2022   Influenza-like illness 01/11/2022   Colon polyp 10/18/2021   Rectal bleeding 08/06/2021   Hemorrhoids 08/06/2021   Abnormal colonoscopy 08/06/2021   Hx of adenomatous colonic polyps 08/06/2021   Tubular adenoma of rectum 08/06/2021   Metatarsalgia of both feet 06/27/2021   Migraines    Lower abdominal pain 09/12/2020   Epistaxis 06/14/2020   Tinea pedis 05/09/2020  Exercise-induced asthma 01/30/2018   Dyslipidemia 01/30/2018   PVC's (premature ventricular contractions) 09/05/2016   Mitral valve prolapse 09/05/2016   Environmental allergies 06/22/2012   History of migraine headaches 06/22/2012    Past Surgical History:  Procedure Laterality Date   BIOPSY  10/03/2021   Procedure: BIOPSY;  Surgeon: Lemar Lofty., MD;  Location: [facility];  Service: Gastroenterology;;   COLONOSCOPY     COLONOSCOPY WITH PROPOFOL N/A 10/03/2021   Procedure: COLONOSCOPY WITH PROPOFOL;  Surgeon: Lemar Lofty., MD;  Location: [facility];   Service: Gastroenterology;  Laterality: N/A;   ENDOSCOPIC MUCOSAL RESECTION N/A 10/03/2021   Procedure: ENDOSCOPIC MUCOSAL RESECTION;  Surgeon: Meridee Score Netty Starring., MD;  Location: [facility];  Service: Gastroenterology;  Laterality: N/A;   WISDOM TOOTH EXTRACTION         Home Medications    Prior to Admission medications   Medication Sig Start Date End Date Taking? Authorizing Provider  acetaminophen-codeine (TYLENOL #3) 300-30 MG tablet Take 1-2 tablets by mouth every 6 (six) hours as needed for moderate pain. 05/29/23   Everrett Coombe, DO  albuterol (PROVENTIL) (2.5 MG/3ML) 0.083% nebulizer solution Take 3 mLs (2.5 mg total) by nebulization every 4 (four) hours as needed for wheezing or shortness of breath. 05/26/23   Emilianna Barlowe L, PA  AMBULATORY NON FORMULARY MEDICATION Please provide Home Oxygen Delivery via nasal cannula  Flow: 2-3 L per minute Frequency: Continuous Duration 3 months 05/30/23   Everrett Coombe, DO  amoxicillin (AMOXIL) 500 MG capsule Take by mouth. 05/18/23   [provider]  atorvastatin (LIPITOR) 10 MG tablet Take 1 tablet (10 mg total) by mouth daily. 01/21/23 01/16/24  Revankar, Aundra Dubin, MD  diazepam (VALIUM) 5 MG tablet Take 0.5-1 tablets (2.5-5 mg total) by mouth every 12 (twelve) hours as needed for muscle spasms. 05/29/23   Everrett Coombe, DO  ipratropium-albuterol (DUONEB) 0.5-2.5 (3) MG/3ML SOLN Take 3 mLs by nebulization every 6 (six) hours as needed. 05/22/23   [provider]  meloxicam (MOBIC) 15 MG tablet One tab PO every 24 hours with a meal for 2 weeks, then once every 24 hours prn pain. 02/13/23   Monica Becton, MD  methocarbamol (ROBAXIN) 500 MG tablet Take 500 mg by mouth 3 (three) times daily. 04/29/23   [provider]  moxifloxacin (AVELOX) 400 MG tablet Take 1 tablet (400 mg total) by mouth daily at 8 pm for 7 days. 05/26/23 06/02/23  Guy Sandifer L, PA  Multiple Vitamin (MULTIVITAMIN WITH MINERALS) TABS tablet  Take 1 tablet by mouth daily.    [provider]  Omega-3 Fatty Acids (FISH OIL) 1000 MG CAPS Take 1,000 mg by mouth daily.    [provider]  oxyCODONE (OXY IR/ROXICODONE) 5 MG immediate release tablet Take by mouth. 04/29/23   [provider]    Family History Family History  Problem Relation Age of Onset   Hypercholesterolemia Mother    Cancer Mother        Endometrial cancer   Hypertension Mother    Diabetes Father    Colon polyps Father    Irritable bowel syndrome Father    Heart disease Paternal Grandfather    Diabetes Paternal Grandfather        borderline   Colon cancer Neg Hx    Esophageal cancer Neg Hx    Pancreatic cancer Neg Hx    Stomach cancer Neg Hx    Inflammatory bowel disease Neg Hx    Liver disease Neg Hx  Rectal cancer Neg Hx     Social History Social History   Tobacco Use   Smoking status: Never   Smokeless tobacco: Never  Vaping Use   Vaping Use: Never used  Substance Use Topics   Alcohol use: Yes    Comment: 2 per week   Drug use: No     Allergies   Minocycline and Lisinopril   Review of Systems Review of Systems  Respiratory:  Positive for cough.   As per HPI   Physical Exam Triage Vital Signs ED Triage Vitals [05/31/23 0920]  Enc Vitals Group     BP 124/77     Pulse Rate 94     Resp (!) 30     Temp 98.4 F (36.9 C)     Temp Source Oral     SpO2 95 %     Weight      Height      Head Circumference      Peak Flow      Pain Score      Pain Loc      Pain Edu?      Excl. in GC?    No data found.  Updated Vital Signs BP 124/77 (BP Location: Left Arm)   Pulse 94   Temp 98.4 F (36.9 C) (Oral)   Resp (!) 30   SpO2 95%   Visual Acuity Right Eye Distance:   Left Eye Distance:   Bilateral Distance:    Right Eye Near:   Left Eye Near:    Bilateral Near:     Physical Exam Vitals and nursing note reviewed. Exam conducted with a chaperone present.  Constitutional:      General: He is in  acute distress.     Appearance: He is ill-appearing, toxic-appearing and diaphoretic.  HENT:     Head: Normocephalic and atraumatic.  Cardiovascular:     Rate and Rhythm: Tachycardia present.  Pulmonary:     Effort: Tachypnea, respiratory distress and retractions present.  Skin:    General: Skin is warm.     Coloration: Skin is not jaundiced.  Neurological:     Mental Status: He is alert and oriented to person, place, and time.      UC Treatments / Results  Labs (all labs ordered are listed, but only abnormal results are displayed) Labs Reviewed - No data to display  EKG   Radiology No results found.  Procedures Procedures (including critical care time)  Medications Ordered in UC Medications - No data to display  Initial Impression / Assessment and Plan / UC Course  I have reviewed the triage vital signs and the nursing notes.  Pertinent labs & imaging results that were available during my care of the patient were reviewed by me and considered in my medical decision making (see chart for details).     Acute respiratory distress - pt is splinting in triage, unable to take a deep breath. His vitals are showing fluctuating O2 levels between 89-95%. HR upon initial eval was close to 200, dropped down to 94. Pt with tachypnea. Review of recent venous O2 levels and worsening CT scan results suggests inpatient treatment indicated. Will send to [facility] for further workup and intervention.    Final Clinical Impressions(s) / UC Diagnoses   Final diagnoses:  Acute respiratory distress  Diffuse pneumonia     Discharge Instructions      Pt was sent to [facility] ER for concern for developing ARDs. Pt hypoxic, tachycardic,  and tachypneic upon evaluation.  I called and spoke with ER physician notifying him of patients arrival via personal vehicle.      ED Prescriptions   None    PDMP not reviewed this encounter.   Maretta Bees, [place] 05/31/23 1057

## 2023-05-31 NOTE — Assessment & Plan Note (Signed)
-   s/p robotic B/L inguinal hernia repair w/ mesh and open primary umbilical hernia repair) on 04/29/23. -Follow-up visit just completed on 05/29/2023 with general surgery.  To continue weight lifting restrictions otherwise has been recovering well

## 2023-05-31 NOTE — Discharge Instructions (Addendum)
Pt was sent to [facility] ER for concern for developing ARDs. Pt hypoxic, tachycardic, and tachypneic upon evaluation.  I called and spoke with ER physician notifying him of patients arrival via personal vehicle.

## 2023-05-31 NOTE — ED Notes (Signed)
Patient is being discharged from the Urgent Care and sent to the Emergency Department via POV w/ his wife . Per Collins Scotland, PA, patient is in need of higher level of care due to tachypnea. . Patient is aware and verbalizes understanding of plan of care.  Vitals:   05/31/23 0920  BP: 124/77  Pulse: 94  Resp: (!) 30  Temp: 98.4 F (36.9 C)  SpO2: 95%   Report called to [facility] ED physician line by Collins Scotland, PA

## 2023-05-31 NOTE — ED Notes (Signed)
ED TO INPATIENT HANDOFF REPORT  Name/Age/Gender Travis Li Score 42 y.o. male  Code Status    Code Status Orders  (From admission, onward)           Start     Ordered   05/31/23 1425  Full code  Continuous       Question:  By:  Answer:  Consent: discussion documented in EHR   05/31/23 1424           Code Status History     This patient has a current code status but no historical code status.       Home/SNF/Other Home  Chief Complaint Acute asthma exacerbation [J45.901]  Level of Care/Admitting Diagnosis ED Disposition     ED Disposition  Admit   Condition  --   Comment  Hospital Area: [facility] [[id number]]  Level of Care: Med-Surg [16]  May admit patient to [facility] or [facility] if equivalent level of care is available:: No  Covid Evaluation: Asymptomatic - no recent exposure (last 10 days) testing not required  Diagnosis: Acute asthma exacerbation [829562]  Admitting Physician: Lewie Chamber [phone]  Attending Physician: Lewie Chamber [phone]  Certification:: I certify this patient will need inpatient services for at least 2 midnights  Estimated Length of Stay: 2          Medical History Past Medical History:  Diagnosis Date   Abnormal colonoscopy 08/06/2021   Colon polyp    Dyslipidemia 01/30/2018   Environmental allergies 06/22/2012   Epistaxis 06/14/2020   Essential hypertension 01/12/2019   Exercise-induced asthma 01/30/2018   Hemorrhoids 08/06/2021   History of migraine headaches 06/22/2012   Hx of adenomatous colonic polyps 08/06/2021   Influenza    Influenza-like illness 01/11/2022   Lower abdominal pain 09/12/2020   Metatarsalgia of both feet 06/27/2021   Migraines    Mitral valve prolapse 09/05/2016   PVC's (premature ventricular contractions) 09/05/2016   Rectal bleeding 08/06/2021   Tinea pedis 05/09/2020   Tubular adenoma of rectum 08/06/2021    Allergies Allergies  Allergen Reactions    Minocycline Hives    Lips swell   Lisinopril Other (See Comments)    Fatigue    IV Location/Drains/Wounds Patient Lines/Drains/Airways Status     Active Line/Drains/Airways     Name Placement date Placement time Site Days   Peripheral IV 05/31/23 20 G 1" Right Antecubital 05/31/23  1111  Antecubital  less than 1            Labs/Imaging Results for orders placed or performed during the hospital encounter of 05/31/23 (from the past 48 hour(s))  Comprehensive metabolic panel     Status: Abnormal   Collection Time: 05/31/23 11:05 AM  Result Value Ref Range   Sodium 137 135 - 145 mmol/L   Potassium 3.6 3.5 - 5.1 mmol/L   Chloride 101 98 - 111 mmol/L   CO2 26 22 - 32 mmol/L   Glucose, Bld 88 70 - 99 mg/dL    Comment: Glucose reference range applies only to samples taken after fasting for at least 8 hours.   BUN 22 (H) 6 - 20 mg/dL   Creatinine, Ser 4.69 0.61 - 1.24 mg/dL   Calcium 8.8 (L) 8.9 - 10.3 mg/dL   Total Protein 7.0 6.5 - 8.1 g/dL   Albumin 3.6 3.5 - 5.0 g/dL   AST 15 15 - 41 U/L   ALT 41 0 - 44 U/L   Alkaline Phosphatase 50 38 - 126 U/L  Total Bilirubin 0.6 0.3 - 1.2 mg/dL   GFR, Estimated >16 >10 mL/min    Comment: (NOTE) Calculated using the CKD-EPI Creatinine Equation (2021)    Anion gap 10 5 - 15    Comment: Performed at [facility], [address], [place], [place] [address]  CBC with Differential     Status: Abnormal   Collection Time: 05/31/23 11:05 AM  Result Value Ref Range   WBC 9.9 4.0 - 10.5 K/uL   RBC 4.10 (L) 4.22 - 5.81 MIL/uL   Hemoglobin 13.4 13.0 - 17.0 g/dL   HCT 40.9 81.1 - 91.4 %   MCV 97.3 80.0 - 100.0 fL   MCH 32.7 26.0 - 34.0 pg   MCHC 33.6 30.0 - 36.0 g/dL   RDW 78.2 95.6 - 21.3 %   Platelets 363 150 - 400 K/uL   nRBC 0.0 0.0 - 0.2 %   Neutrophils Relative % 75 %   Neutro Abs 7.4 1.7 - 7.7 K/uL   Lymphocytes Relative 15 %   Lymphs Abs 1.5 0.7 - 4.0 K/uL   Monocytes Relative 6 %   Monocytes Absolute 0.6  0.1 - 1.0 K/uL   Eosinophils Relative 3 %   Eosinophils Absolute 0.3 0.0 - 0.5 K/uL   Basophils Relative 0 %   Basophils Absolute 0.0 0.0 - 0.1 K/uL   Immature Granulocytes 1 %   Abs Immature Granulocytes 0.06 0.00 - 0.07 K/uL    Comment: Performed at [facility], [address] [place], [place] [address]   DG Chest 2 View  Result Date: 05/31/2023 CLINICAL DATA:  Cough. EXAM: CHEST - 2 VIEW COMPARISON:  03/07/2022 FINDINGS: The heart size and mediastinal contours are within normal limits. Both lungs are clear. The visualized skeletal structures are unremarkable. IMPRESSION: No active cardiopulmonary disease. Electronically Signed   By: Signa Kell M.D.   On: 05/31/2023 11:38    Pending Labs Unresulted Labs (From admission, onward)     Start     Ordered   06/01/23 0500  Basic metabolic panel  Daily,   R     Question:  Specimen collection method  Answer:  Lab=Lab collect   05/31/23 1443   06/01/23 0500  CBC with Differential/Platelet  Daily,   R     Question:  Specimen collection method  Answer:  Lab=Lab collect   05/31/23 1443   06/01/23 0500  Magnesium  Daily,   R     Question:  Specimen collection method  Answer:  Lab=Lab collect   05/31/23 1443   05/31/23 1238  Respiratory (~20 pathogens) panel by PCR  (Respiratory panel by PCR (~20 pathogens, ~24 hr TAT)  w precautions)  Once,   URGENT        05/31/23 1237   Signed and Held  HIV Antibody (routine testing w rflx)  (HIV Antibody (Routine testing w reflex) panel)  Add-on,   R        Signed and Held            Vitals/Pain Today's Vitals   05/31/23 1112 05/31/23 1115 05/31/23 1308 05/31/23 1403  BP:  119/71    Pulse:  88    Resp:  20    Temp:    98.2 F (36.8 C)  TempSrc:    Oral  SpO2: 95% 99% 98%   Weight:      Height:      PainSc:        Isolation Precautions Droplet precaution  Medications Medications  revefenacin (YUPELRI) nebulizer solution 175 mcg (175 mcg Nebulization Not Given  05/31/23 1313)  arformoterol (BROVANA) nebulizer solution 15 mcg (15 mcg Nebulization Given 05/31/23 1308)  budesonide (PULMICORT) nebulizer solution 0.25 mg (0.25 mg Nebulization Given 05/31/23 1313)  ipratropium-albuterol (DUONEB) 0.5-2.5 (3) MG/3ML nebulizer solution 3 mL (has no administration in time range)  chlorpheniramine-HYDROcodone (TUSSIONEX) 10-8 MG/5ML suspension 5 mL (5 mLs Oral Given 05/31/23 1421)  dextromethorphan (DELSYM) 30 MG/5ML liquid 60 mg (60 mg Oral Given 05/31/23 1420)  methylPREDNISolone sodium succinate (SOLU-MEDROL) 125 mg/2 mL injection 125 mg (has no administration in time range)  sodium chloride 0.9 % bolus 1,000 mL (1,000 mLs Intravenous New Bag/Given 05/31/23 1130)  methylPREDNISolone sodium succinate (SOLU-MEDROL) 125 mg/2 mL injection 125 mg (125 mg Intravenous Given 05/31/23 1130)  ipratropium-albuterol (DUONEB) 0.5-2.5 (3) MG/3ML nebulizer solution 3 mL (3 mLs Nebulization Given 05/31/23 1111)    Mobility walks

## 2023-05-31 NOTE — H&P (Signed)
History and Physical    Travis Li  ZOX:[id number]  DOB: [birth year]  DOA: 05/31/2023  PCP: Everrett Coombe, DO Patient coming from: Home  Chief Complaint: Cough, SOB  HPI:  [name] is a 42 yo male with exercise induced asthma, HLD, HTN, MVP, recent hernia repair (s/p robotic B/L inguinal hernia repair w/ mesh and open primary umbilical hernia repair) on 04/29/23.  He had recent follow-up with general surgery on 05/29/2023 and was considered doing well.  Ongoing weight lifting restriction was continued otherwise was doing well from a postop standpoint.  He has also been recently hospitalized from 05/19/2023 until 05/22/2023 due to mycoplasma pneumonia of LUL.  He was discharged on the 13th with ongoing course of azithromycin and cefdinir for 5 more days. He has had difficulty recovering with lingering severe cough and intermittent oxygen desaturations into the upper 80s due to severe coughing. He presented to urgent care on 05/26/2023 and was prescribed 7-day course of Avelox.  He then followed up with primary care on 05/29/2023 and also given Solu-Medrol 125 mg along with course of Tylenol 3 and Valium. He has continued to have ongoing coughing with intermittent mild yellow sputum production.  He denies any fevers.  Due to persistent cough, he presented for further evaluation.  In the ER he was found to have some mild desaturation into the upper 80s on room air during his coughing spells. He was treated with repeat dose of Solu-Medrol and nebulizers and recommended for admission for further evaluation.  Pulmonology was also consulted.  I have personally briefly reviewed patient's old medical records in [facility] [place] [facility] and discussed patient with the ER provider when appropriate/indicated.  Assessment and Plan: * Acute asthma exacerbation - may be having some exacerbation of asthma now in setting of post-infectious cough and recent Mycoplasma infection - difficulty with symptomatic control  outpatient esp now with some hypoxia due to exacerbation -Follow-up [facility] -Pulmonology also consulted from the ER - Continue pupillary, Brovana, budesonide - DuoNebs as needed - Also adding on Tussionex and Delsym for some cough suppression -Continuing IV steroids per pulmonology recommendations then prednisone taper at discharge  Pneumonia of left upper lobe due to Mycoplasma pneumoniae - recent hospitalization 6/10 - 6/13 with LUL pna; discharged on Rocephin and azithromycin for 5 days; then was started on 7-day course of Avelox on 05/26/2023 -Continue on 2 more days of Levaquin to complete 7-day course which should be more than sufficient for the infection  Hypoxia - Saturations down into the upper 80s on room air with coughing spells -See workup above.  Continue oxygen and weaning as able - Walk test prior to discharge  Umbilical hernia without obstruction and without gangrene - s/p robotic B/L inguinal hernia repair w/ mesh and open primary umbilical hernia repair) on 04/29/23. -Follow-up visit just completed on 05/29/2023 with general surgery.  To continue weight lifting restrictions otherwise has been recovering well    Code Status:     Code Status: Full Code  DVT Prophylaxis: PADUA<4     Anticipated disposition is to: Home  History: Past Medical History:  Diagnosis Date   Abnormal colonoscopy 08/06/2021   Colon polyp    Dyslipidemia 01/30/2018   Environmental allergies 06/22/2012   Epistaxis 06/14/2020   Essential hypertension 01/12/2019   Exercise-induced asthma 01/30/2018   Hemorrhoids 08/06/2021   History of migraine headaches 06/22/2012   Hx of adenomatous colonic polyps 08/06/2021   Influenza    Influenza-like illness 01/11/2022   Lower abdominal pain 09/12/2020  Metatarsalgia of both feet 06/27/2021   Migraines    Mitral valve prolapse 09/05/2016   PVC's (premature ventricular contractions) 09/05/2016   Rectal bleeding 08/06/2021   Tinea pedis 05/09/2020    Tubular adenoma of rectum 08/06/2021    Past Surgical History:  Procedure Laterality Date   BIOPSY  10/03/2021   Procedure: BIOPSY;  Surgeon: Lemar Lofty., MD;  Location: [facility];  Service: Gastroenterology;;   COLONOSCOPY     COLONOSCOPY WITH PROPOFOL N/A 10/03/2021   Procedure: COLONOSCOPY WITH PROPOFOL;  Surgeon: Lemar Lofty., MD;  Location: [facility];  Service: Gastroenterology;  Laterality: N/A;   ENDOSCOPIC MUCOSAL RESECTION N/A 10/03/2021   Procedure: ENDOSCOPIC MUCOSAL RESECTION;  Surgeon: Meridee Score Netty Starring., MD;  Location: [facility];  Service: Gastroenterology;  Laterality: N/A;   WISDOM TOOTH EXTRACTION       reports that he has never smoked. He has never used smokeless tobacco. He reports current alcohol use. He reports that he does not use drugs.  Allergies  Allergen Reactions   Minocycline Hives    Lips swell   Lisinopril Other (See Comments)    Fatigue    Family History  Problem Relation Age of Onset   Hypercholesterolemia Mother    Cancer Mother        Endometrial cancer   Hypertension Mother    Diabetes Father    Colon polyps Father    Irritable bowel syndrome Father    Heart disease Paternal Grandfather    Diabetes Paternal Grandfather        borderline   Colon cancer Neg Hx    Esophageal cancer Neg Hx    Pancreatic cancer Neg Hx    Stomach cancer Neg Hx    Inflammatory bowel disease Neg Hx    Liver disease Neg Hx    Rectal cancer Neg Hx    Home Medications: Prior to Admission medications   Medication Sig Start Date End Date Taking? Authorizing Provider  acetaminophen-codeine (TYLENOL #3) 300-30 MG tablet Take 1-2 tablets by mouth every 6 (six) hours as needed for moderate pain. 05/29/23  Yes Everrett Coombe, DO  albuterol (PROVENTIL) (2.5 MG/3ML) 0.083% nebulizer solution Take 3 mLs (2.5 mg total) by nebulization every 4 (four) hours as needed for wheezing or shortness of breath. 05/26/23  Yes Crain, Whitney L, PA   atorvastatin (LIPITOR) 10 MG tablet Take 1 tablet (10 mg total) by mouth daily. 01/21/23 01/16/24 Yes Revankar, Aundra Dubin, MD  diazepam (VALIUM) 5 MG tablet Take 0.5-1 tablets (2.5-5 mg total) by mouth every 12 (twelve) hours as needed for muscle spasms. 05/29/23  Yes Everrett Coombe, DO  ipratropium-albuterol (DUONEB) 0.5-2.5 (3) MG/3ML SOLN Take 3 mLs by nebulization every 6 (six) hours as needed (wheezing/shortness of breath). 05/22/23  Yes [provider]  moxifloxacin (AVELOX) 400 MG tablet Take 1 tablet (400 mg total) by mouth daily at 8 pm for 7 days. 05/26/23 06/02/23 Yes Crain, Whitney L, PA  Multiple Vitamin (MULTIVITAMIN WITH MINERALS) TABS tablet Take 1 tablet by mouth daily.   Yes [provider]  Omega-3 Fatty Acids (FISH OIL) 1000 MG CAPS Take 1,000 mg by mouth daily.   Yes [provider]  AMBULATORY NON FORMULARY MEDICATION Please provide Home Oxygen Delivery via nasal cannula  Flow: 2-3 L per minute Frequency: Continuous Duration 3 months 05/30/23   Everrett Coombe, DO  amoxicillin (AMOXIL) 500 MG capsule Take by mouth. Patient not taking: Reported on 05/31/2023 05/18/23   [provider]  meloxicam [facility])  15 MG tablet One tab PO every 24 hours with a meal for 2 weeks, then once every 24 hours prn pain. Patient not taking: Reported on 05/31/2023 02/13/23   Monica Becton, MD  methocarbamol (ROBAXIN) 500 MG tablet Take 500 mg by mouth 3 (three) times daily. Patient not taking: Reported on 05/31/2023 04/29/23   [provider]  oxyCODONE (OXY IR/ROXICODONE) 5 MG immediate release tablet Take by mouth. Patient not taking: Reported on 05/31/2023 04/29/23   [provider]    Review of Systems:  Review of Systems  Constitutional: Negative.   HENT: Negative.    Eyes: Negative.   Respiratory:  Positive for cough, sputum production, shortness of breath and wheezing.   Cardiovascular: Negative.   Gastrointestinal: Negative.    Genitourinary: Negative.   Musculoskeletal: Negative.   Skin: Negative.   Neurological: Negative.   Endo/Heme/Allergies: Negative.   Psychiatric/Behavioral: Negative.      Physical Exam:  Vitals:   05/31/23 1112 05/31/23 1115 05/31/23 1308 05/31/23 1403  BP:  119/71    Pulse:  88    Resp:  20    Temp:    98.2 F (36.8 C)  TempSrc:    Oral  SpO2: 95% 99% 98%   Weight:      Height:       Physical Exam Constitutional:      General: He is not in acute distress.    Appearance: Normal appearance.  HENT:     Head: Normocephalic and atraumatic.     Mouth/Throat:     Mouth: Mucous membranes are moist.  Eyes:     Extraocular Movements: Extraocular movements intact.  Cardiovascular:     Rate and Rhythm: Normal rate and regular rhythm.  Pulmonary:     Effort: Pulmonary effort is normal. No respiratory distress.     Breath sounds: Rhonchi (diffuse) present. No wheezing.  Abdominal:     General: Bowel sounds are normal. There is no distension.     Palpations: Abdomen is soft.     Tenderness: There is no abdominal tenderness.     Comments: Recent surgical incisions noted and healing well  Musculoskeletal:        General: Normal range of motion.     Cervical back: Normal range of motion and neck supple.  Skin:    General: Skin is warm and dry.  Neurological:     General: No focal deficit present.     Mental Status: He is alert.  Psychiatric:        Mood and Affect: Mood normal.        Behavior: Behavior normal.      Labs on Admission:  I have personally reviewed following labs and imaging studies Results for orders placed or performed during the hospital encounter of 05/31/23 (from the past 24 hour(s))  Comprehensive metabolic panel     Status: Abnormal   Collection Time: 05/31/23 11:05 AM  Result Value Ref Range   Sodium 137 135 - 145 mmol/L   Potassium 3.6 3.5 - 5.1 mmol/L   Chloride 101 98 - 111 mmol/L   CO2 26 22 - 32 mmol/L   Glucose, Bld 88 70 - 99 mg/dL    BUN 22 (H) 6 - 20 mg/dL   Creatinine, Ser 3.24 0.61 - 1.24 mg/dL   Calcium 8.8 (L) 8.9 - 10.3 mg/dL   Total Protein 7.0 6.5 - 8.1 g/dL   Albumin 3.6 3.5 - 5.0 g/dL   AST 15 15 - 41 U/L  ALT 41 0 - 44 U/L   Alkaline Phosphatase 50 38 - 126 U/L   Total Bilirubin 0.6 0.3 - 1.2 mg/dL   GFR, Estimated >30 >86 mL/min   Anion gap 10 5 - 15  CBC with Differential     Status: Abnormal   Collection Time: 05/31/23 11:05 AM  Result Value Ref Range   WBC 9.9 4.0 - 10.5 K/uL   RBC 4.10 (L) 4.22 - 5.81 MIL/uL   Hemoglobin 13.4 13.0 - 17.0 g/dL   HCT 57.8 46.9 - 62.9 %   MCV 97.3 80.0 - 100.0 fL   MCH 32.7 26.0 - 34.0 pg   MCHC 33.6 30.0 - 36.0 g/dL   RDW 52.8 41.3 - 24.4 %   Platelets 363 150 - 400 K/uL   nRBC 0.0 0.0 - 0.2 %   Neutrophils Relative % 75 %   Neutro Abs 7.4 1.7 - 7.7 K/uL   Lymphocytes Relative 15 %   Lymphs Abs 1.5 0.7 - 4.0 K/uL   Monocytes Relative 6 %   Monocytes Absolute 0.6 0.1 - 1.0 K/uL   Eosinophils Relative 3 %   Eosinophils Absolute 0.3 0.0 - 0.5 K/uL   Basophils Relative 0 %   Basophils Absolute 0.0 0.0 - 0.1 K/uL   Immature Granulocytes 1 %   Abs Immature Granulocytes 0.06 0.00 - 0.07 K/uL     Radiological Exams on Admission: DG Chest 2 View  Result Date: 05/31/2023 CLINICAL DATA:  Cough. EXAM: CHEST - 2 VIEW COMPARISON:  03/07/2022 FINDINGS: The heart size and mediastinal contours are within normal limits. Both lungs are clear. The visualized skeletal structures are unremarkable. IMPRESSION: No active cardiopulmonary disease. Electronically Signed   By: Signa Kell M.D.   On: 05/31/2023 11:38   DG Chest 2 View  Final Result      Consults called:  Pulmonology    EKG: Independently reviewed. NSR   Lewie Chamber, MD [facility] 05/31/2023, 2:40 PM

## 2023-06-01 ENCOUNTER — Telehealth: Payer: Self-pay | Admitting: Pulmonary Disease

## 2023-06-01 DIAGNOSIS — J157 Pneumonia due to Mycoplasma pneumoniae: Secondary | ICD-10-CM | POA: Diagnosis not present

## 2023-06-01 DIAGNOSIS — J4541 Moderate persistent asthma with (acute) exacerbation: Secondary | ICD-10-CM

## 2023-06-01 DIAGNOSIS — R0902 Hypoxemia: Secondary | ICD-10-CM

## 2023-06-01 LAB — VITAMIN B12: Vitamin B-12: 1144 pg/mL — ABNORMAL HIGH (ref 180–914)

## 2023-06-01 LAB — CBC WITH DIFFERENTIAL/PLATELET
Abs Immature Granulocytes: 0.06 10*3/uL (ref 0.00–0.07)
Basophils Absolute: 0 10*3/uL (ref 0.0–0.1)
Basophils Relative: 0 %
Eosinophils Absolute: 0 10*3/uL (ref 0.0–0.5)
Eosinophils Relative: 0 %
HCT: 37.3 % — ABNORMAL LOW (ref 39.0–52.0)
Hemoglobin: 13.1 g/dL (ref 13.0–17.0)
Immature Granulocytes: 1 %
Lymphocytes Relative: 11 %
Lymphs Abs: 1.1 10*3/uL (ref 0.7–4.0)
MCH: 35 pg — ABNORMAL HIGH (ref 26.0–34.0)
MCHC: 35.1 g/dL (ref 30.0–36.0)
MCV: 99.7 fL (ref 80.0–100.0)
Monocytes Absolute: 0.5 10*3/uL (ref 0.1–1.0)
Monocytes Relative: 5 %
Neutro Abs: 8.2 10*3/uL — ABNORMAL HIGH (ref 1.7–7.7)
Neutrophils Relative %: 83 %
Platelets: 358 10*3/uL (ref 150–400)
RBC: 3.74 MIL/uL — ABNORMAL LOW (ref 4.22–5.81)
RDW: 12 % (ref 11.5–15.5)
WBC: 9.8 10*3/uL (ref 4.0–10.5)
nRBC: 0 % (ref 0.0–0.2)

## 2023-06-01 LAB — BASIC METABOLIC PANEL
Anion gap: 10 (ref 5–15)
BUN: 17 mg/dL (ref 6–20)
CO2: 26 mmol/L (ref 22–32)
Calcium: 9.1 mg/dL (ref 8.9–10.3)
Chloride: 102 mmol/L (ref 98–111)
Creatinine, Ser: 1.11 mg/dL (ref 0.61–1.24)
GFR, Estimated: >60 mL/min (ref >60)
Glucose, Bld: 122 mg/dL — ABNORMAL HIGH (ref 70–99)
Potassium: 4.1 mmol/L (ref 3.5–5.1)
Sodium: 138 mmol/L (ref 135–145)

## 2023-06-01 LAB — HIV ANTIBODY (ROUTINE TESTING W REFLEX): HIV Screen 4th Generation wRfx: NONREACTIVE

## 2023-06-01 LAB — FOLATE: Folate: 13 ng/mL (ref >5.9)

## 2023-06-01 LAB — MAGNESIUM: Magnesium: 2.3 mg/dL (ref 1.7–2.4)

## 2023-06-01 NOTE — Progress Notes (Signed)
SATURATION QUALIFICATIONS: (This note is used to comply with regulatory documentation for home oxygen)  Patient Saturations on Room Air at Rest = 96%  Patient Saturations on Room Air while Ambulating = 95%  Patient Saturations on 2 Liters of oxygen while Ambulating = 97%  Please briefly explain why patient needs home oxygen:  

## 2023-06-01 NOTE — Telephone Encounter (Signed)
Please schedule hospital follow-up with Dr. Judeth Horn in the next 3 to 6 weeks.  Thank you!

## 2023-06-01 NOTE — Progress Notes (Signed)
Progress Note    Travis Li   VFI:[id number]  DOB: 1981-02-29  DOA: 05/31/2023     1 PCP: Travis Coombe, DO  Initial CC: SOB, cough, hypoxia   Hospital Course: Travis Li is a 43 yo male with exercise induced asthma, HLD, HTN, MVP, recent hernia repair (s/p robotic B/L inguinal hernia repair w/ mesh and open primary umbilical hernia repair) on 04/29/23.  He had recent follow-up with general surgery on 05/29/2023 and was considered doing well.  Ongoing weight lifting restriction was continued otherwise was doing well from a postop standpoint.  He has also been recently hospitalized from 05/19/2023 until 05/22/2023 due to mycoplasma pneumonia of LUL.  He was discharged on the 13th with ongoing course of azithromycin and cefdinir for 5 more days. He has had difficulty recovering with lingering severe cough and intermittent oxygen desaturations into the upper 80s due to severe coughing. He presented to urgent care on 05/26/2023 and was prescribed 7-day course of Avelox.  He then followed up with primary care on 05/29/2023 and also given Solu-Medrol 125 mg along with course of Tylenol 3 and Valium. He has continued to have ongoing coughing with intermittent mild yellow sputum production.  He denies any fevers.  Due to persistent cough, he presented for further evaluation.  In the ER he was found to have some mild desaturation into the upper 80s on room air during his coughing spells. He was treated with repeat dose of Solu-Medrol and nebulizers and recommended for admission for further evaluation.  Pulmonology was also consulted.  Interval History:  No events overnight.  Still having some lingering cough but overall breathing much better today.  Also able to move better air.  Combination of multiple treatments has been helping.  Assessment and Plan: * Acute asthma exacerbation - may be having some exacerbation of asthma now in setting of post-infectious cough and recent Mycoplasma infection -  difficulty with symptomatic control outpatient esp now with some hypoxia due to exacerbation - RVP negative as well -Pulmonology also consulted from the ER - Continue pupillary, Brovana, budesonide - DuoNebs as needed - Also adding on Tussionex and Delsym for some cough suppression -Continuing IV steroids per pulmonology recommendations then prednisone taper at discharge  Pneumonia of left upper lobe due to Mycoplasma pneumoniae - recent hospitalization 6/10 - 6/13 with LUL pna; discharged on Rocephin and azithromycin for 5 days; then was started on 7-day course of Avelox on 05/26/2023 -Continue on 2 more days of Levaquin to complete 7-day course which should be more than sufficient for the infection  Hypoxia-resolved as of 06/01/2023 - Saturations down into the upper 80s on room air with coughing spells -See workup above.  Continue oxygen and weaning as able -Underwent walk test on 06/01/2023 with no further desaturation  Umbilical hernia without obstruction and without gangrene - s/p robotic B/L inguinal hernia repair w/ mesh and open primary umbilical hernia repair) on 04/29/23. -Follow-up visit just completed on 05/29/2023 with general surgery.  To continue weight lifting restrictions otherwise has been recovering well   Old records reviewed in assessment of this patient  Antimicrobials: Levaquin  DVT prophylaxis:  PADUA<4   Code Status:   Code Status: Full Code  Mobility Assessment (last 72 hours)     Mobility Assessment     Row Name 06/01/23 1200 05/31/23 2300         Does patient have an order for bedrest or is patient medically unstable No - Continue assessment No - Continue assessment  What is the highest level of mobility based on the progressive mobility assessment? Level 6 (Walks independently in room and hall) - Balance while walking in room without assist - Complete Level 6 (Walks independently in room and hall) - Balance while walking in room without assist -  Complete               Barriers to discharge: none Disposition Plan:  Home  Status is: Inpt  Objective: Blood pressure 134/77, pulse 73, temperature 97.6 F (36.4 C), temperature source Oral, resp. rate 18, height 6\' 7"  (2.007 m), weight 96.6 kg, SpO2 96 %.  Examination:  Physical Exam Constitutional:      General: He is not in acute distress.    Appearance: Normal appearance.  HENT:     Head: Normocephalic and atraumatic.     Mouth/Throat:     Mouth: Mucous membranes are moist.  Eyes:     Extraocular Movements: Extraocular movements intact.  Cardiovascular:     Rate and Rhythm: Normal rate and regular rhythm.  Pulmonary:     Effort: Pulmonary effort is normal. No respiratory distress.     Breath sounds: Rhonchi (much improved with better air movement) present. No wheezing.  Abdominal:     General: Bowel sounds are normal. There is no distension.     Palpations: Abdomen is soft.     Tenderness: There is no abdominal tenderness.     Comments: Recent surgical incisions noted and healing well  Musculoskeletal:        General: Normal range of motion.     Cervical back: Normal range of motion and neck supple.  Skin:    General: Skin is warm and dry.  Neurological:     General: No focal deficit present.     Mental Status: He is alert.  Psychiatric:        Mood and Affect: Mood normal.        Behavior: Behavior normal.      Consultants:  Pulmonology   Procedures:    Data Reviewed: Results for orders placed or performed during the hospital encounter of 05/31/23 (from the past 24 hour(s))  Respiratory (~20 pathogens) panel by PCR     Status: None   Collection Time: 05/31/23  7:03 PM   Specimen: Nasopharyngeal Swab; Respiratory  Result Value Ref Range   Adenovirus NOT DETECTED NOT DETECTED   Coronavirus 229E NOT DETECTED NOT DETECTED   Coronavirus HKU1 NOT DETECTED NOT DETECTED   Coronavirus NL63 NOT DETECTED NOT DETECTED   Coronavirus OC43 NOT DETECTED NOT  DETECTED   Metapneumovirus NOT DETECTED NOT DETECTED   Rhinovirus / Enterovirus NOT DETECTED NOT DETECTED   Influenza A NOT DETECTED NOT DETECTED   Influenza B NOT DETECTED NOT DETECTED   Parainfluenza Virus 1 NOT DETECTED NOT DETECTED   Parainfluenza Virus 2 NOT DETECTED NOT DETECTED   Parainfluenza Virus 3 NOT DETECTED NOT DETECTED   Parainfluenza Virus 4 NOT DETECTED NOT DETECTED   Respiratory Syncytial Virus NOT DETECTED NOT DETECTED   Bordetella pertussis NOT DETECTED NOT DETECTED   Bordetella Parapertussis NOT DETECTED NOT DETECTED   Chlamydophila pneumoniae NOT DETECTED NOT DETECTED   Mycoplasma pneumoniae NOT DETECTED NOT DETECTED  Basic metabolic panel     Status: Abnormal   Collection Time: 06/01/23  6:38 AM  Result Value Ref Range   Sodium 138 135 - 145 mmol/L   Potassium 4.1 3.5 - 5.1 mmol/L   Chloride 102 98 - 111 mmol/L   CO2 26  22 - 32 mmol/L   Glucose, Bld 122 (H) 70 - 99 mg/dL   BUN 17 6 - 20 mg/dL   Creatinine, Ser 1.02 0.61 - 1.24 mg/dL   Calcium 9.1 8.9 - 72.5 mg/dL   GFR, Estimated >36 >64 mL/min   Anion gap 10 5 - 15  CBC with Differential/Platelet     Status: Abnormal   Collection Time: 06/01/23  6:38 AM  Result Value Ref Range   WBC 9.8 4.0 - 10.5 K/uL   RBC 3.74 (L) 4.22 - 5.81 MIL/uL   Hemoglobin 13.1 13.0 - 17.0 g/dL   HCT 40.3 (L) 47.4 - 25.9 %   MCV 99.7 80.0 - 100.0 fL   MCH 35.0 (H) 26.0 - 34.0 pg   MCHC 35.1 30.0 - 36.0 g/dL   RDW 56.3 87.5 - 64.3 %   Platelets 358 150 - 400 K/uL   nRBC 0.0 0.0 - 0.2 %   Neutrophils Relative % 83 %   Neutro Abs 8.2 (H) 1.7 - 7.7 K/uL   Lymphocytes Relative 11 %   Lymphs Abs 1.1 0.7 - 4.0 K/uL   Monocytes Relative 5 %   Monocytes Absolute 0.5 0.1 - 1.0 K/uL   Eosinophils Relative 0 %   Eosinophils Absolute 0.0 0.0 - 0.5 K/uL   Basophils Relative 0 %   Basophils Absolute 0.0 0.0 - 0.1 K/uL   Immature Granulocytes 1 %   Abs Immature Granulocytes 0.06 0.00 - 0.07 K/uL  Magnesium     Status: None    Collection Time: 06/01/23  6:38 AM  Result Value Ref Range   Magnesium 2.3 1.7 - 2.4 mg/dL  HIV Antibody (routine testing w rflx)     Status: None   Collection Time: 06/01/23  6:38 AM  Result Value Ref Range   HIV Screen 4th Generation wRfx Non Reactive Non Reactive  Folate     Status: None   Collection Time: 06/01/23 10:22 AM  Result Value Ref Range   Folate 13.0 >5.9 ng/mL  Vitamin B12     Status: Abnormal   Collection Time: 06/01/23 10:22 AM  Result Value Ref Range   Vitamin B-12 1,144 (H) 180 - 914 pg/mL    I have reviewed pertinent nursing notes, vitals, labs, and images as necessary. I have ordered labwork to follow up on as indicated.  I have reviewed the last notes from staff over past 24 hours. I have discussed patient's care plan and test results with nursing staff, CM/SW, and other staff as appropriate.  Time spent: Greater than 50% of the 55 minute visit was spent in counseling/coordination of care for the patient as laid out in the A&P.   LOS: 1 day   Lewie Chamber, MD [facility] 06/01/2023, 1:13 PM

## 2023-06-01 NOTE — Progress Notes (Signed)
NAME:  Travis Li, MRN:  [id number], DOB:  [birth year], LOS: 1 ADMISSION DATE:  05/31/2023, CONSULTATION DATE:  06/01/23  REFERRING MD:  ED, CHIEF COMPLAINT: Cough  History of Present Illness:  42 year old man history of exercise-induced asthma and recent hospitalization for mycoplasma pneumonia community-acquired pneumonia is admitted with severe cough.  Recent H&P and discharge summary reviewed.  Multiple ED notes reviewed.  Usually asthma well-controlled.  Exercise-induced.  Has risk inhaler but rarely uses.  Often expires.  Management for many years.  Acute onset of cough and shortness of breath.  Went to urgent care.  Was diagnosed with pneumonia.  Given antibiotics.  Had worsening symptoms ultimately presented to the emergency room was admitted earlier 05/2023.  CT scan revealed scattered groundglass opacities concerning for atypical pneumonia.  He was diagnosed with mycoplasma pneumonia.  Treated.  Discharged.  Since then has had severe cough.  With deep breaths.  Associated right-sided pain with coughing, muscle soreness.  Got a dose of what sounds like IM steroids at his PCP.  Symptoms much improved for a day.  Then recurred.  He is not on a maintenance inhaler.  Given ongoing symptoms he presented to the ED today.  Chest x-ray obtained in the ED on my review interpretation reveals clear lungs bilaterally.  Reviewed CTA PE protocol 2 days prior ED visit, which reports improving groundglass opacities, no acute or worsening findings, no PE.  Pertinent  Medical History  Exercise-induced asthma  Significant Hospital Events: Including procedures, antibiotic start and stop dates in addition to other pertinent events   Admitted to the hospital 6/22 with worsening bronchitis symptoms  Interim History / Subjective:  NAEON. VSS. Feels a little improved.  Objective   Blood pressure 134/77, pulse 73, temperature 97.6 F (36.4 C), temperature source Oral, resp. rate 18, height 6\' 7"  (2.007 m),  weight 96.6 kg, SpO2 97 %.        Intake/Output Summary (Last 24 hours) at 06/01/2023 1002 Last data filed at 06/01/2023 0300 Gross per 24 hour  Intake 250 ml  Output --  Net 250 ml   Filed Weights   05/31/23 1014  Weight: 96.6 kg    Examination: General: Sitting in stretcher, in no acute distress Eyes: EOMI, no icterus Neck: Supple, JVP Lungs: Clear, frequently coughing spasms with deep breath, normal work of breathing on 2 L nasal cannula Cardiovascular: Warm, no edema Abdomen: Nondistended, bowel sounds present MSK: No synovitis, no joint effusion Neuro: No focal deficits, sensation appears intact Psych: Normal mood, full affect  Resolved Hospital Problem list     Assessment & Plan:  Severe persistent asthma with exacerbation: Prolonged symptoms after recent illness, mycoplasma pneumonia community acquired pneumonia.  CT scan recently showed improving infiltrates which is reassuring, no PE.  Sounds like he had poorly controlled symptoms for some time even preceding the CAP diagnosis. -- Roxy Manns, arformoterol for triple inhaled therapy via nebulizer -- DuoNebs as needed -- IV steroids until signs of clinical improvement then recommend a prednisone taper of 40 mg for 5 days then 20 mg for 5 days then 10 mg for 5 days then stop --Recommend discharge on Breztri 2 puffs twice daily as new maintenance inhalers given recurrent exacerbations triple inhaled therapy is warranted --Would likely benefit from a small supply of opiate cough syrup upon discharge -- Absolute eosinophil 300 05/29/2023 at outside hospital, consider Biologics in the outpatient setting if remains poorly controlled despite initiation of controller medications  Will arrange outpatient follow-up in pulmonary clinic  after discussion with patient.  PCCM will sign off.  Best Practice (right click and "Reselect all SmartList Selections" daily)   Per primary  Labs   CBC: Recent Labs  Lab 05/26/23 1039  05/31/23 1105 06/01/23 0638  WBC 10.3 9.9 9.8  NEUTROABS 7.9* 7.4 8.2*  HGB 14.6 13.4 13.1  HCT 44.1 39.9 37.3*  MCV 92 97.3 99.7  PLT 423 363 358     Basic Metabolic Panel: Recent Labs  Lab 05/26/23 1039 05/31/23 1105 06/01/23 0638  NA 140 137 138  K 4.9 3.6 4.1  CL 101 101 102  CO2 21 26 26   GLUCOSE 90 88 122*  BUN 22 22* 17  CREATININE 1.12 1.17 1.11  CALCIUM 9.8 8.8* 9.1  MG  --   --  2.3    GFR: Estimated Creatinine Clearance: 113.7 mL/min (by C-G formula based on SCr of 1.11 mg/dL). Recent Labs  Lab 05/26/23 1039 05/31/23 1105 06/01/23 0638  WBC 10.3 9.9 9.8     Liver Function Tests: Recent Labs  Lab 05/31/23 1105  AST 15  ALT 41  ALKPHOS 50  BILITOT 0.6  PROT 7.0  ALBUMIN 3.6    No results for input(s): "LIPASE", "AMYLASE" in the last 168 hours. No results for input(s): "AMMONIA" in the last 168 hours.  ABG No results found for: "PHART", "PCO2ART", "PO2ART", "HCO3", "TCO2", "ACIDBASEDEF", "O2SAT"   Coagulation Profile: No results for input(s): "INR", "PROTIME" in the last 168 hours.  Cardiac Enzymes: No results for input(s): "CKTOTAL", "CKMB", "CKMBINDEX", "TROPONINI" in the last 168 hours.  HbA1C: Hgb A1c MFr Bld  Date/Time Value Ref Range Status  10/23/2022 08:26 AM 5.4 4.8 - 5.6 % Final    Comment:             Prediabetes: 5.7 - 6.4          Diabetes: >6.4          Glycemic control for adults with diabetes: <7.0   10/22/2021 09:05 AM 5.4 4.8 - 5.6 % Final    Comment:             Prediabetes: 5.7 - 6.4          Diabetes: >6.4          Glycemic control for adults with diabetes: <7.0     CBG: No results for input(s): "GLUCAP" in the last 168 hours.  Review of Systems:   No orthopnea or PND.  Comprehensive review of systems otherwise negative.  Past Medical History:  He,  has a past medical history of Abnormal colonoscopy (08/06/2021), Colon polyp, Dyslipidemia (01/30/2018), Environmental allergies (06/22/2012), Epistaxis  (06/14/2020), Essential hypertension (01/12/2019), Exercise-induced asthma (01/30/2018), Hemorrhoids (08/06/2021), History of migraine headaches (06/22/2012), adenomatous colonic polyps (08/06/2021), Influenza, Influenza-like illness (01/11/2022), Lower abdominal pain (09/12/2020), Metatarsalgia of both feet (06/27/2021), Migraines, Mitral valve prolapse (09/05/2016), PVC's (premature ventricular contractions) (09/05/2016), Rectal bleeding (08/06/2021), Tinea pedis (05/09/2020), and Tubular adenoma of rectum (08/06/2021).   Surgical History:   Past Surgical History:  Procedure Laterality Date   BIOPSY  10/03/2021   Procedure: BIOPSY;  Surgeon: Meridee Score Netty Starring., MD;  Location: [facility];  Service: Gastroenterology;;   COLONOSCOPY     COLONOSCOPY WITH PROPOFOL N/A 10/03/2021   Procedure: COLONOSCOPY WITH PROPOFOL;  Surgeon: Lemar Lofty., MD;  Location: [facility];  Service: Gastroenterology;  Laterality: N/A;   ENDOSCOPIC MUCOSAL RESECTION N/A 10/03/2021   Procedure: ENDOSCOPIC MUCOSAL RESECTION;  Surgeon: Meridee Score Netty Starring., MD;  Location: [facility];  Service: Gastroenterology;  Laterality: N/A;   WISDOM TOOTH EXTRACTION       Social History:   reports that he has never smoked. He has never used smokeless tobacco. He reports current alcohol use. He reports that he does not use drugs.   Family History:  His family history includes Cancer in his mother; Colon polyps in his father; Diabetes in his father and paternal grandfather; Heart disease in his paternal grandfather; Hypercholesterolemia in his mother; Hypertension in his mother; Irritable bowel syndrome in his father. There is no history of Colon cancer, Esophageal cancer, Pancreatic cancer, Stomach cancer, Inflammatory bowel disease, Liver disease, or Rectal cancer.   Allergies Allergies  Allergen Reactions   Minocycline Hives    Lips swell   Lisinopril Other (See Comments)    Fatigue     Home  Medications  Prior to Admission medications   Medication Sig Start Date End Date Taking? Authorizing Provider  acetaminophen-codeine (TYLENOL #3) 300-30 MG tablet Take 1-2 tablets by mouth every 6 (six) hours as needed for moderate pain. 05/29/23   Everrett Coombe, DO  albuterol (PROVENTIL) (2.5 MG/3ML) 0.083% nebulizer solution Take 3 mLs (2.5 mg total) by nebulization every 4 (four) hours as needed for wheezing or shortness of breath. 05/26/23   Crain, Whitney L, PA  AMBULATORY NON FORMULARY MEDICATION Please provide Home Oxygen Delivery via nasal cannula  Flow: 2-3 L per minute Frequency: Continuous Duration 3 months 05/30/23   Everrett Coombe, DO  amoxicillin (AMOXIL) 500 MG capsule Take by mouth. 05/18/23   [provider]  atorvastatin (LIPITOR) 10 MG tablet Take 1 tablet (10 mg total) by mouth daily. 01/21/23 01/16/24  Revankar, Aundra Dubin, MD  diazepam (VALIUM) 5 MG tablet Take 0.5-1 tablets (2.5-5 mg total) by mouth every 12 (twelve) hours as needed for muscle spasms. 05/29/23   Everrett Coombe, DO  ipratropium-albuterol (DUONEB) 0.5-2.5 (3) MG/3ML SOLN Take 3 mLs by nebulization every 6 (six) hours as needed. 05/22/23   [provider]  meloxicam (MOBIC) 15 MG tablet One tab PO every 24 hours with a meal for 2 weeks, then once every 24 hours prn pain. 02/13/23   Monica Becton, MD  methocarbamol (ROBAXIN) 500 MG tablet Take 500 mg by mouth 3 (three) times daily. 04/29/23   [provider]  moxifloxacin (AVELOX) 400 MG tablet Take 1 tablet (400 mg total) by mouth daily at 8 pm for 7 days. 05/26/23 06/02/23  Guy Sandifer L, PA  Multiple Vitamin (MULTIVITAMIN WITH MINERALS) TABS tablet Take 1 tablet by mouth daily.    [provider]  Omega-3 Fatty Acids (FISH OIL) 1000 MG CAPS Take 1,000 mg by mouth daily.    [provider]  oxyCODONE (OXY IR/ROXICODONE) 5 MG immediate release tablet Take by mouth. 04/29/23   [provider]     Critical care  time: n/a    Karren Burly, MD See Loretha Stapler

## 2023-06-02 DIAGNOSIS — R0902 Hypoxemia: Secondary | ICD-10-CM | POA: Diagnosis not present

## 2023-06-02 DIAGNOSIS — J4541 Moderate persistent asthma with (acute) exacerbation: Secondary | ICD-10-CM | POA: Diagnosis not present

## 2023-06-02 MED ORDER — DEXTROMETHORPHAN POLISTIREX ER 30 MG/5ML PO SUER: 60.0000 mg | Freq: Three times a day (TID) | ORAL | 0 refills | Status: DC | PRN

## 2023-06-02 MED ORDER — BREZTRI AEROSPHERE 160-9-4.8 MCG/ACT IN AERO: 2.0000 | INHALATION_SPRAY | Freq: Two times a day (BID) | RESPIRATORY_TRACT | 3 refills | Status: DC

## 2023-06-02 MED ORDER — PREDNISONE 10 MG PO TABS: ORAL_TABLET | ORAL | 0 refills | Status: DC

## 2023-06-02 MED ORDER — HYDROCOD POLI-CHLORPHE POLI ER 10-8 MG/5ML PO SUER: 5.0000 mL | Freq: Two times a day (BID) | ORAL | 0 refills | Status: DC | PRN

## 2023-06-02 NOTE — Discharge Summary (Signed)
Physician Discharge Summary   Travis Li ION:[id number] DOB: July 11, 1981 DOA: 05/31/2023  PCP: Everrett Coombe, DO  Admit date: 05/31/2023 Discharge date: 06/02/2023   Admitted From: Home Disposition:  Home Discharging physician: Lewie Chamber, MD Barriers to discharge: none  Recommendations at discharge: Follow up with pulmonology    Discharge Condition: stable CODE STATUS: Full Diet recommendation:  Diet Orders (From admission, onward)     Start     Ordered   06/02/23 0000  Diet general        06/02/23 0913   05/31/23 1723  Diet regular Room service appropriate? Yes; Fluid consistency: Thin  Diet effective now       Question Answer Comment  Room service appropriate? Yes   Fluid consistency: Thin      05/31/23 1722            Hospital Course: [name] is a 42 yo male with exercise induced asthma, HLD, HTN, MVP, recent hernia repair (s/p robotic B/L inguinal hernia repair w/ mesh and open primary umbilical hernia repair) on 04/29/23.  He had recent follow-up with general surgery on 05/29/2023 and was considered doing well.  Ongoing weight lifting restriction was continued otherwise was doing well from a postop standpoint.  He has also been recently hospitalized from 05/19/2023 until 05/22/2023 due to mycoplasma pneumonia of LUL.  He was discharged on the 13th with ongoing course of azithromycin and cefdinir for 5 more days. He has had difficulty recovering with lingering severe cough and intermittent oxygen desaturations into the upper 80s due to severe coughing. He presented to urgent care on 05/26/2023 and was prescribed 7-day course of Avelox.  He then followed up with primary care on 05/29/2023 and also given Solu-Medrol 125 mg along with course of Tylenol 3 and Valium. He has continued to have ongoing coughing with intermittent mild yellow sputum production.  He denies any fevers.  Due to persistent cough, he presented for further evaluation.  In the ER he was found  to have some mild desaturation into the upper 80s on room air during his coughing spells. He was treated with repeat dose of Solu-Medrol and nebulizers and recommended for admission for further evaluation.  Pulmonology was also consulted.  Assessment and Plan: * Acute asthma exacerbation - may be having some exacerbation of asthma now in setting of post-infectious cough and recent Mycoplasma infection - difficulty with symptomatic control outpatient esp now with some hypoxia due to exacerbation - RVP negative as well -Pulmonology also consulted; planning for outpatient follow-up as well -Continued on Breztri at discharge - Also continued on prednisone taper - Continue Tussionex  Pneumonia of left upper lobe due to Mycoplasma pneumoniae - recent hospitalization 6/10 - 6/13 with LUL pna; discharged on Rocephin and azithromycin for 5 days; then was started on 7-day course of Avelox on 05/26/2023 -Continue on 2 more days of Levaquin to complete 7-day course which should be more than sufficient for the infection; course completed inpatient  Hypoxia-resolved as of 06/01/2023 - Saturations down into the upper 80s on room air with coughing spells -See workup above.  Continue oxygen and weaning as able -Underwent walk test on 06/01/2023 with no further desaturation  Umbilical hernia without obstruction and without gangrene - s/p robotic B/L inguinal hernia repair w/ mesh and open primary umbilical hernia repair) on 04/29/23. -Follow-up visit just completed on 05/29/2023 with general surgery.  To continue weight lifting restrictions otherwise has been recovering well   The patient's chronic medical conditions were treated  accordingly per the patient's home medication regimen except as noted.  On day of discharge, patient was felt deemed stable for discharge. Patient/family member advised to call PCP or come back to ER if needed.   Principal Diagnosis: Acute asthma exacerbation  Discharge  Diagnoses: Active Hospital Problems   Diagnosis Date Noted   Acute asthma exacerbation 05/31/2023    Priority: 1.   Pneumonia of left upper lobe due to Mycoplasma pneumoniae 05/19/2023    Priority: 2.   Umbilical hernia without obstruction and without gangrene 03/27/2023    Resolved Hospital Problems   Diagnosis Date Noted Date Resolved   Hypoxia 05/31/2023 06/01/2023    Priority: 3.     Discharge Instructions     Diet general   Complete by: As directed    Increase activity slowly   Complete by: As directed       Allergies as of 06/02/2023       Reactions   Minocycline Hives   Lips swell   Lisinopril Other (See Comments)   Fatigue        Medication List     STOP taking these medications    acetaminophen-codeine 300-30 MG tablet Commonly known as: TYLENOL #3   AMBULATORY NON FORMULARY MEDICATION   amoxicillin 500 MG capsule Commonly known as: AMOXIL   diazepam 5 MG tablet Commonly known as: VALIUM   ipratropium-albuterol 0.5-2.5 (3) MG/3ML Soln Commonly known as: DUONEB   meloxicam 15 MG tablet Commonly known as: MOBIC   methocarbamol 500 MG tablet Commonly known as: ROBAXIN   moxifloxacin 400 MG tablet Commonly known as: AVELOX   oxyCODONE 5 MG immediate release tablet Commonly known as: Oxy IR/ROXICODONE       TAKE these medications    albuterol (2.5 MG/3ML) 0.083% nebulizer solution Commonly known as: PROVENTIL Take 3 mLs (2.5 mg total) by nebulization every 4 (four) hours as needed for wheezing or shortness of breath.   atorvastatin 10 MG tablet Commonly known as: LIPITOR Take 1 tablet (10 mg total) by mouth daily.   Breztri Aerosphere 160-9-4.8 MCG/ACT Aero Generic drug: Budeson-Glycopyrrol-Formoterol Inhale 2 puffs into the lungs in the morning and at bedtime.   chlorpheniramine-HYDROcodone 10-8 MG/5ML Commonly known as: TUSSIONEX Take 5 mLs by mouth every 12 (twelve) hours as needed for cough.   dextromethorphan 30 MG/5ML  liquid Commonly known as: DELSYM Take 10 mLs (60 mg total) by mouth 3 (three) times daily as needed for cough.   Fish Oil 1000 MG Caps Take 1,000 mg by mouth daily.   multivitamin with minerals Tabs tablet Take 1 tablet by mouth daily.   predniSONE 10 MG tablet Commonly known as: DELTASONE Take 4 tablets daily for 5 days then 2 tablets daily for 5 days then 1 tablet daily for 5 days        Follow-up Information     Hunsucker, Lesia Sago, MD. Schedule an appointment as soon as possible for a visit in 3 week(s).   Specialty: Pulmonary Disease Contact information: [address] [place] [address] [phone]                Allergies  Allergen Reactions   Minocycline Hives    Lips swell   Lisinopril Other (See Comments)    Fatigue    Consultations: Pulmonology   Procedures:   Discharge Exam: BP 129/78 (BP Location: Left Arm)   Pulse 67   Temp 98.1 F (36.7 C) (Oral)   Resp 16   Ht 6'  7" (2.007 m)   Wt 96.6 kg   SpO2 95%   BMI 24.00 kg/m  Physical Exam Constitutional:      General: He is not in acute distress.    Appearance: Normal appearance.  HENT:     Head: Normocephalic and atraumatic.     Mouth/Throat:     Mouth: Mucous membranes are moist.  Eyes:     Extraocular Movements: Extraocular movements intact.  Cardiovascular:     Rate and Rhythm: Normal rate and regular rhythm.  Pulmonary:     Effort: Pulmonary effort is normal. No respiratory distress.     Breath sounds: No wheezing.  Abdominal:     General: Bowel sounds are normal. There is no distension.     Palpations: Abdomen is soft.     Tenderness: There is no abdominal tenderness.     Comments: Recent surgical incisions noted and healing well  Musculoskeletal:        General: Normal range of motion.     Cervical back: Normal range of motion and neck supple.  Skin:    General: Skin is warm and dry.  Neurological:     General: No focal deficit present.      Mental Status: He is alert.  Psychiatric:        Mood and Affect: Mood normal.        Behavior: Behavior normal.      The results of significant diagnostics from this hospitalization (including imaging, microbiology, ancillary and laboratory) are listed below for reference.   Microbiology: Recent Results (from the past 240 hour(s))  Respiratory (~20 pathogens) panel by PCR     Status: None   Collection Time: 05/31/23  7:03 PM   Specimen: Nasopharyngeal Swab; Respiratory  Result Value Ref Range Status   Adenovirus NOT DETECTED NOT DETECTED Final   Coronavirus 229E NOT DETECTED NOT DETECTED Final    Comment: (NOTE) The Coronavirus on the Respiratory Panel, DOES NOT test for the novel  Coronavirus (2019 nCoV)    Coronavirus HKU1 NOT DETECTED NOT DETECTED Final   Coronavirus NL63 NOT DETECTED NOT DETECTED Final   Coronavirus OC43 NOT DETECTED NOT DETECTED Final   Metapneumovirus NOT DETECTED NOT DETECTED Final   Rhinovirus / Enterovirus NOT DETECTED NOT DETECTED Final   Influenza A NOT DETECTED NOT DETECTED Final   Influenza B NOT DETECTED NOT DETECTED Final   Parainfluenza Virus 1 NOT DETECTED NOT DETECTED Final   Parainfluenza Virus 2 NOT DETECTED NOT DETECTED Final   Parainfluenza Virus 3 NOT DETECTED NOT DETECTED Final   Parainfluenza Virus 4 NOT DETECTED NOT DETECTED Final   Respiratory Syncytial Virus NOT DETECTED NOT DETECTED Final   Bordetella pertussis NOT DETECTED NOT DETECTED Final   Bordetella Parapertussis NOT DETECTED NOT DETECTED Final   Chlamydophila pneumoniae NOT DETECTED NOT DETECTED Final   Mycoplasma pneumoniae NOT DETECTED NOT DETECTED Final    Comment: Performed at [facility], [address] [place], [place] [address]     Labs: BNP (last 3 results) No results for input(s): "BNP" in the last 8760 hours. Basic Metabolic Panel: Recent Labs  Lab 05/31/23 1105 06/01/23 0638  NA 137 138  K 3.6 4.1  CL 101 102  CO2 26 26  GLUCOSE 88 122*  BUN  22* 17  CREATININE 1.17 1.11  CALCIUM 8.8* 9.1  MG  --  2.3   Liver Function Tests: Recent Labs  Lab 05/31/23 1105  AST 15  ALT 41  ALKPHOS 50  BILITOT 0.6  PROT 7.0  ALBUMIN 3.6   No results for input(s): "LIPASE", "AMYLASE" in the last 168 hours. No results for input(s): "AMMONIA" in the last 168 hours. CBC: Recent Labs  Lab 05/31/23 1105 06/01/23 0638  WBC 9.9 9.8  NEUTROABS 7.4 8.2*  HGB 13.4 13.1  HCT 39.9 37.3*  MCV 97.3 99.7  PLT 363 358   Cardiac Enzymes: No results for input(s): "CKTOTAL", "CKMB", "CKMBINDEX", "TROPONINI" in the last 168 hours. BNP: Invalid input(s): "POCBNP" CBG: No results for input(s): "GLUCAP" in the last 168 hours. D-Dimer No results for input(s): "DDIMER" in the last 72 hours. Hgb A1c No results for input(s): "HGBA1C" in the last 72 hours. Lipid Profile No results for input(s): "CHOL", "HDL", "LDLCALC", "TRIG", "CHOLHDL", "LDLDIRECT" in the last 72 hours. Thyroid function studies No results for input(s): "TSH", "T4TOTAL", "T3FREE", "THYROIDAB" in the last 72 hours.  Invalid input(s): "FREET3" Anemia work up Recent Labs    06/01/23 1022  VITAMINB12 1,144*  FOLATE 13.0   Urinalysis    Component Value Date/Time   COLORURINE YELLOW 05/21/2009 2204   APPEARANCEUR CLEAR 05/21/2009 2204   LABSPEC 1.022 05/21/2009 2204   PHURINE 6.0 05/21/2009 2204   GLUCOSEU NEGATIVE 05/21/2009 2204   HGBUR NEGATIVE 05/21/2009 2204   BILIRUBINUR negative 09/25/2020 0941   BILIRUBINUR negative 03/11/2019 1608   KETONESUR negative 09/25/2020 0941   KETONESUR 15 (A) 05/21/2009 2204   PROTEINUR Negative 03/11/2019 1608   PROTEINUR NEGATIVE 05/21/2009 2204   UROBILINOGEN 0.2 09/25/2020 0941   UROBILINOGEN 1.0 05/21/2009 2204   NITRITE Negative 09/25/2020 0941   NITRITE negative 03/11/2019 1608   NITRITE NEGATIVE 05/21/2009 2204   LEUKOCYTESUR Negative 09/25/2020 0941   Sepsis Labs Recent Labs  Lab 05/31/23 1105 06/01/23 0638  WBC 9.9  9.8   Microbiology Recent Results (from the past 240 hour(s))  Respiratory (~20 pathogens) panel by PCR     Status: None   Collection Time: 05/31/23  7:03 PM   Specimen: Nasopharyngeal Swab; Respiratory  Result Value Ref Range Status   Adenovirus NOT DETECTED NOT DETECTED Final   Coronavirus 229E NOT DETECTED NOT DETECTED Final    Comment: (NOTE) The Coronavirus on the Respiratory Panel, DOES NOT test for the novel  Coronavirus (2019 nCoV)    Coronavirus HKU1 NOT DETECTED NOT DETECTED Final   Coronavirus NL63 NOT DETECTED NOT DETECTED Final   Coronavirus OC43 NOT DETECTED NOT DETECTED Final   Metapneumovirus NOT DETECTED NOT DETECTED Final   Rhinovirus / Enterovirus NOT DETECTED NOT DETECTED Final   Influenza A NOT DETECTED NOT DETECTED Final   Influenza B NOT DETECTED NOT DETECTED Final   Parainfluenza Virus 1 NOT DETECTED NOT DETECTED Final   Parainfluenza Virus 2 NOT DETECTED NOT DETECTED Final   Parainfluenza Virus 3 NOT DETECTED NOT DETECTED Final   Parainfluenza Virus 4 NOT DETECTED NOT DETECTED Final   Respiratory Syncytial Virus NOT DETECTED NOT DETECTED Final   Bordetella pertussis NOT DETECTED NOT DETECTED Final   Bordetella Parapertussis NOT DETECTED NOT DETECTED Final   Chlamydophila pneumoniae NOT DETECTED NOT DETECTED Final   Mycoplasma pneumoniae NOT DETECTED NOT DETECTED Final    Comment: Performed at [facility], [address] [place], [place] [address]    Procedures/Studies: DG Chest 2 View  Result Date: 05/31/2023 CLINICAL DATA:  Cough. EXAM: CHEST - 2 VIEW COMPARISON:  03/07/2022 FINDINGS: The heart size and mediastinal contours are within normal limits. Both lungs are clear. The visualized skeletal structures are unremarkable.  IMPRESSION: No active cardiopulmonary disease. Electronically Signed   By: Signa Kell M.D.   On: 05/31/2023 11:38     Time coordinating discharge: Over 30 minutes    Lewie Chamber, MD  [facility] 06/02/2023,  2:38 PM

## 2023-06-02 NOTE — TOC CM/SW Note (Signed)
Transition of Care [facility]) - Inpatient Brief Assessment   Patient Details  Name: Travis Li MRN: [id number] Date of Birth: [birth year]  Transition of Care [facility] CM/SW Contact:    Otelia Santee, LCSW Phone Number: 06/02/2023, 10:30 AM   Clinical Narrative: Chart reviewed. No TOC needs identified.    Transition of Care Asessment: Insurance and Status: Insurance coverage has been reviewed Patient has primary care physician: Yes Home environment has been reviewed: Home w/ spouse Prior level of function:: Independent Prior/Current Home Services: No current home services Social Determinants of Health Reivew: SDOH reviewed no interventions necessary Readmission risk has been reviewed: Yes Transition of care needs: no transition of care needs at this time

## 2023-06-04 ENCOUNTER — Telehealth: Payer: Self-pay | Admitting: *Deleted

## 2023-06-04 ENCOUNTER — Encounter: Payer: Self-pay | Admitting: *Deleted

## 2023-06-04 NOTE — Transitions of Care (Post Inpatient/ED Visit) (Signed)
   06/04/2023  Name: Travis Li MRN: [id number] DOB: December 28, 1980  Today's TOC FU Call Status: Today's TOC FU Call Status:: Unsuccessul Call (1st Attempt) Unsuccessful Call (1st Attempt) Date: 06/04/23  Attempted to reach the patient regarding the most recent Inpatient visit; left HIPAA compliant voice message requesting call back  Follow Up Plan: Additional outreach attempts will be made to reach the patient to complete the Transitions of Care (Post Inpatient visit) call.   Caryl Pina, RN, BSN, CCRN Alumnus RN CM Care Coordination/ Transition of Care- [facility] Care Management [phone]: direct office

## 2023-06-05 ENCOUNTER — Encounter: Payer: Self-pay | Admitting: *Deleted

## 2023-06-05 ENCOUNTER — Telehealth: Payer: Self-pay | Admitting: *Deleted

## 2023-06-05 NOTE — Transitions of Care (Post Inpatient/ED Visit) (Signed)
   06/05/2023  Name: Travis Li MRN: [id number] DOB: November 23, 1981  Today's TOC FU Call Status: Today's TOC FU Call Status:: Unsuccessful Call (2nd Attempt) Unsuccessful Call (2nd Attempt) Date: 06/05/23  Attempted to reach the patient regarding the most recent Inpatient visit; left HIPAA compliant voice message requesting call back  Follow Up Plan: Additional outreach attempts will be made to reach the patient to complete the Transitions of Care (Post Inpatient visit) call.   Caryl Pina, RN, BSN, CCRN Alumnus RN CM Care Coordination/ Transition of Care- [facility] Care Management [phone]: direct office

## 2023-06-06 ENCOUNTER — Encounter: Payer: Self-pay | Admitting: *Deleted

## 2023-06-06 ENCOUNTER — Telehealth: Payer: Self-pay | Admitting: *Deleted

## 2023-06-06 NOTE — Transitions of Care (Post Inpatient/ED Visit) (Signed)
   06/06/2023  Name: Travis Li MRN: [id number] DOB: 1981-01-05  Today's TOC FU Call Status: Today's TOC FU Call Status:: Unsuccessful Call (3rd Attempt) Unsuccessful Call (3rd Attempt) Date: 06/06/23  Attempted to reach the patient regarding the most recent Inpatient visit; left HIPAA compliant voice message requesting call back  Follow Up Plan: No further outreach attempts will be made at this time. We have been unable to contact the patient.  Caryl Pina, RN, BSN, CCRN Alumnus RN CM Care Coordination/ Transition of Care- [facility] Care Management [phone]: direct office

## 2023-07-14 ENCOUNTER — Inpatient Hospital Stay: Payer: BC Managed Care – PPO | Admitting: Pulmonary Disease

## 2023-08-14 ENCOUNTER — Telehealth: Payer: Self-pay | Admitting: Family Medicine

## 2023-08-14 NOTE — Telephone Encounter (Addendum)
Raynelle Fanning, primary care nurse case manager called. She states that patient has a primary care nurse program. They offer care groups and they support plan of care. Contact number for Raynelle Fanning: [phone] Ext #[phone]

## 2023-08-27 ENCOUNTER — Encounter: Payer: Self-pay | Admitting: Pulmonary Disease

## 2023-08-27 ENCOUNTER — Ambulatory Visit (INDEPENDENT_AMBULATORY_CARE_PROVIDER_SITE_OTHER): Payer: BC Managed Care – PPO | Admitting: Pulmonary Disease

## 2023-08-27 VITALS — BP 118/70 | HR 82 | Ht >= 80 in | Wt 230.0 lb

## 2023-08-27 DIAGNOSIS — J45990 Exercise induced bronchospasm: Secondary | ICD-10-CM

## 2023-08-27 NOTE — Progress Notes (Signed)
@Patient  ID: Travis Li, male    DOB: [birth year], 42 y.o.   MRN: [id number]  Chief Complaint  Patient presents with   Follow-up    Pt is here for Hospital F/U visit. Pt was admitted 05/31/23-06/02/2023 due to Acute Respiratory Failure with Hypoxia. Pt states he has no problems today.    Referring provider: Everrett Coombe, DO  HPI:   42 y.o. man history of exercise-induced asthma and hospitalization for mycoplasma pneumonia community-acquired pneumonia 05/2023 with subsequent is admission later 05/2023 at [facility] long with severe cough, low oxygen.  Discharge summary reviewed.  Doing much better.  Exercise tolerance improving.  Not back to baseline but overall improving.  Notes his heart rate with exertion is slowly improving over time.  No wheeze.  No cough.  Finished prednisone taper.  Was using Breztri for a while.  Has now stopped this.  Markus Daft was causing him to have difficulty sleeping.  Using as needed.  Rare use in the last couple months.  Has not used albuterol in a very long time.  Overall symptoms very well-controlled.  Back at work.  Has resumed international travel.  History at time of consultation in hospital 05/2023: Usually asthma well-controlled.  Exercise-induced.  Has risk inhaler but rarely uses.  Often expires.  Management for many years.  Acute onset of cough and shortness of breath.  Went to urgent care.  Was diagnosed with pneumonia.  Given antibiotics.  Had worsening symptoms ultimately presented to the emergency room was admitted earlier 05/2023.  CT scan revealed scattered groundglass opacities concerning for atypical pneumonia.  He was diagnosed with mycoplasma pneumonia.  Treated.  Discharged.  Since then has had severe cough.  With deep breaths.  Associated right-sided pain with coughing, muscle soreness.  Got a dose of what sounds like IM steroids at his PCP.  Symptoms much improved for a day.  Then recurred.  He is not on a maintenance inhaler.  Given ongoing  symptoms he presented to the ED today.   Chest x-ray obtained in the ED on my review interpretation reveals clear lungs bilaterally.  Reviewed CTA PE protocol 2 days prior ED visit, which reports improving groundglass opacities, no acute or worsening findings, no PE.    Questionaires / Pulmonary Flowsheets:   ACT:      No data to display          MMRC:     No data to display          Epworth:      No data to display          Tests:   FENO:  No results found for: "NITRICOXIDE"  PFT:     No data to display          WALK:      No data to display          Imaging: Personally reviewed and as per EMR and discussion in this note No results found.  Lab Results: Personally reviewed CBC    Component Value Date/Time   WBC 9.8 06/01/2023 0638   RBC 3.74 (L) 06/01/2023 0638   HGB 13.1 06/01/2023 0638   HGB 14.6 05/26/2023 1039   HCT 37.3 (L) 06/01/2023 0638   HCT 44.1 05/26/2023 1039   PLT 358 06/01/2023 0638   PLT 423 05/26/2023 1039   MCV 99.7 06/01/2023 0638   MCV 92 05/26/2023 1039   MCH 35.0 (H) 06/01/2023 0638   MCHC 35.1 06/01/2023 0638   RDW  12.0 06/01/2023 0638   RDW 11.8 05/26/2023 1039   LYMPHSABS 1.1 06/01/2023 0638   LYMPHSABS 1.3 05/26/2023 1039   MONOABS 0.5 06/01/2023 0638   EOSABS 0.0 06/01/2023 0638   EOSABS 0.3 05/26/2023 1039   BASOSABS 0.0 06/01/2023 0638   BASOSABS 0.1 05/26/2023 1039    BMET    Component Value Date/Time   NA 138 06/01/2023 0638   NA 140 05/26/2023 1039   K 4.1 06/01/2023 0638   CL 102 06/01/2023 0638   CO2 26 06/01/2023 0638   GLUCOSE 122 (H) 06/01/2023 0638   BUN 17 06/01/2023 0638   BUN 22 05/26/2023 1039   CREATININE 1.11 06/01/2023 0638   CREATININE 1.35 09/12/2020 1640   CALCIUM 9.1 06/01/2023 0638   GFRNONAA >60 06/01/2023 0638   GFRNONAA 65 09/12/2020 1640   GFRAA 76 09/12/2020 1640    BNP No results found for: "BNP"  ProBNP No results found for: "PROBNP"  Specialty Problems        Pulmonary Problems   Exercise-induced asthma   Epistaxis   Influenza   Acute respiratory failure with hypoxia (HCC)   Pneumonia of left upper lobe due to Mycoplasma pneumoniae   Acute asthma exacerbation    Allergies  Allergen Reactions   Minocycline Hives    Lips swell   Lisinopril Other (See Comments)    Fatigue    Immunization History  Administered Date(s) Administered   Influenza,inj,Quad PF,6+ Mos 09/05/2016, 08/29/2021   Influenza-Unspecified 09/08/2017, 09/19/2020   Moderna Sars-Covid-2 Vaccination 02/12/2020, 03/12/2020   Tdap 07/16/2011    Past Medical History:  Diagnosis Date   Abnormal colonoscopy 08/06/2021   Colon polyp    Dyslipidemia 01/30/2018   Environmental allergies 06/22/2012   Epistaxis 06/14/2020   Essential hypertension 01/12/2019   Exercise-induced asthma 01/30/2018   Hemorrhoids 08/06/2021   History of migraine headaches 06/22/2012   Hx of adenomatous colonic polyps 08/06/2021   Influenza    Influenza-like illness 01/11/2022   Lower abdominal pain 09/12/2020   Metatarsalgia of both feet 06/27/2021   Migraines    Mitral valve prolapse 09/05/2016   PVC's (premature ventricular contractions) 09/05/2016   Rectal bleeding 08/06/2021   Tinea pedis 05/09/2020   Tubular adenoma of rectum 08/06/2021    Tobacco History: Social History   Tobacco Use  Smoking Status Never  Smokeless Tobacco Never   Counseling given: Not Answered   Continue to not smoke  Outpatient Encounter Medications as of 08/27/2023  Medication Sig   atorvastatin (LIPITOR) 10 MG tablet Take 1 tablet (10 mg total) by mouth daily.   Multiple Vitamin (MULTIVITAMIN WITH MINERALS) TABS tablet Take 1 tablet by mouth daily.   Omega-3 Fatty Acids (FISH OIL) 1000 MG CAPS Take 1,000 mg by mouth daily.   [DISCONTINUED] albuterol (PROVENTIL) (2.5 MG/3ML) 0.083% nebulizer solution Take 3 mLs (2.5 mg total) by nebulization every 4 (four) hours as needed for wheezing or  shortness of breath.   [DISCONTINUED] Budeson-Glycopyrrol-Formoterol (BREZTRI AEROSPHERE) 160-9-4.8 MCG/ACT AERO Inhale 2 puffs into the lungs in the morning and at bedtime.   [DISCONTINUED] chlorpheniramine-HYDROcodone (TUSSIONEX) 10-8 MG/5ML Take 5 mLs by mouth every 12 (twelve) hours as needed for cough.   [DISCONTINUED] dextromethorphan (DELSYM) 30 MG/5ML liquid Take 10 mLs (60 mg total) by mouth 3 (three) times daily as needed for cough.   [DISCONTINUED] predniSONE (DELTASONE) 10 MG tablet Take 4 tablets daily for 5 days then 2 tablets daily for 5 days then 1 tablet daily for 5 days   No facility-administered  encounter medications on file as of 08/27/2023.     Review of Systems  Review of Systems  No chest pain with exertion.  No orthopnea or PND.  Comprehensive review of systems otherwise negative. Physical Exam  BP 118/70 (BP Location: Left Arm, Cuff Size: Large)   Pulse 82   Ht 6\' 8"  (2.032 m)   Wt 230 lb (104.3 kg)   SpO2 97%   BMI 25.27 kg/m   Wt Readings from Last 5 Encounters:  08/27/23 230 lb (104.3 kg)  05/31/23 213 lb (96.6 kg)  05/29/23 213 lb (96.6 kg)  05/26/23 213 lb (96.6 kg)  02/21/23 225 lb (102.1 kg)    BMI Readings from Last 5 Encounters:  08/27/23 25.27 kg/m  05/31/23 24.00 kg/m  05/29/23 24.00 kg/m  05/26/23 24.00 kg/m  02/21/23 24.72 kg/m     Physical Exam General: Sitting in chair, no acute distress Eyes: EOMI, no icterus Neck: Supple, no JVP Pulmonary: Clear, normal work of breathing Cardiovascular: Warm, no edema Abdomen: Nondistended, bowel sounds present MSK: No synovitis, no joint effusion Neuro: Normal gait, no weakness Psych: Normal mood, full affect   Assessment & Plan:   Asthma: Preceding diagnosis.  Significant worsening symptoms in the setting of pneumonia, mycoplasma pneumonia a diagnosis and hospitalization.  With prolonged bronchitis symptoms thereafter.  Requiring additional hospitalization and prolonged steroid  taper.  Now much better.  Likely again triggered by infectious etiology.  Use Breztri as needed.  Symptoms are stable and improving off maintenance inhaler.  Can use albuterol as needed.  Rarely needs it at steady state.  Encouraged him to contact me for refills or if I can help in any way in the future.  Notably eosinophils were 300 05/2023.   Return if symptoms worsen or fail to improve.   Karren Burly, MD 08/27/2023   This appointment required 40 minutes of patient care (this includes precharting, chart review, review of results, face-to-face care, etc.).

## 2023-08-27 NOTE — Patient Instructions (Signed)
Glad you are doing well  You can use Breztri as needed, if symptoms become more constant we can resume 2 puffs twice a day every day  Let me know if any refills of this or albuterol in the future  Return to clinic as needed

## 2023-11-20 ENCOUNTER — Ambulatory Visit: Payer: BC Managed Care – PPO | Attending: Cardiology | Admitting: Cardiology

## 2023-11-20 ENCOUNTER — Encounter: Payer: Self-pay | Admitting: Cardiology

## 2023-11-20 VITALS — BP 128/70 | HR 70 | Ht 79.6 in | Wt 228.1 lb

## 2023-11-20 DIAGNOSIS — R011 Cardiac murmur, unspecified: Secondary | ICD-10-CM | POA: Diagnosis not present

## 2023-11-20 DIAGNOSIS — I341 Nonrheumatic mitral (valve) prolapse: Secondary | ICD-10-CM | POA: Diagnosis not present

## 2023-11-20 DIAGNOSIS — E785 Hyperlipidemia, unspecified: Secondary | ICD-10-CM

## 2023-11-20 NOTE — Patient Instructions (Signed)
Medication Instructions:  Your physician recommends that you continue on your current medications as directed. Please refer to the Current Medication list given to you today.  *If you need a refill on your cardiac medications before your next appointment, please call your pharmacy*   Lab Work: None ordered If you have labs (blood work) drawn today and your tests are completely normal, you will receive your results only by: MyChart Message (if you have MyChart) OR A paper copy in the mail If you have any lab test that is abnormal or we need to change your treatment, we will call you to review the results.   Testing/Procedures: Your physician has requested that you have an echocardiogram. Echocardiography is a painless test that uses sound waves to create images of your heart. It provides your doctor with information about the size and shape of your heart and how well your heart's chambers and valves are working. This procedure takes approximately one hour. There are no restrictions for this procedure. Please do NOT wear cologne, perfume, aftershave, or lotions (deodorant is allowed). Please arrive 15 minutes prior to your appointment time.     Follow-Up: At [facility], you and your health needs are our priority.  As part of our continuing mission to provide you with exceptional heart care, we have created designated Provider Care Teams.  These Care Teams include your primary Cardiologist (physician) and Advanced Practice Providers (APPs -  Physician Assistants and Nurse Practitioners) who all work together to provide you with the care you need, when you need it.  We recommend signing up for the patient portal called "MyChart".  Sign up information is provided on this After Visit Summary.  MyChart is used to connect with patients for Virtual Visits (Telemedicine).  Patients are able to view lab/test results, encounter notes, upcoming appointments, etc.  Non-urgent messages can be sent to  your provider as well.   To learn more about what you can do with MyChart, go to [email or web address].    Your next appointment:   12 month(s)  The format for your next appointment:   In Person  Provider:   Rajan Revankar, MD   Other Instructions Echocardiogram An echocardiogram is a test that uses sound waves (ultrasound) to produce images of the heart. Images from an echocardiogram can provide important information about: Heart size and shape. The size and thickness and movement of your heart's walls. Heart muscle function and strength. Heart valve function or if you have stenosis. Stenosis is when the heart valves are too narrow. If blood is flowing backward through the heart valves (regurgitation). A tumor or infectious growth around the heart valves. Areas of heart muscle that are not working well because of poor blood flow or injury from a heart attack. Aneurysm detection. An aneurysm is a weak or damaged part of an artery wall. The wall bulges out from the normal force of blood pumping through the body. Tell a health care provider about: Any allergies you have. All medicines you are taking, including vitamins, herbs, eye drops, creams, and over-the-counter medicines. Any blood disorders you have. Any surgeries you have had. Any medical conditions you have. Whether you are pregnant or may be pregnant. What are the risks? Generally, this is a safe test. However, problems may occur, including an allergic reaction to dye (contrast) that may be used during the test. What happens before the test? No specific preparation is needed. You may eat and drink normally. What happens during the test? You will   take off your clothes from the waist up and put on a hospital gown. Electrodes or electrocardiogram (ECG)patches may be placed on your chest. The electrodes or patches are then connected to a device that monitors your heart rate and rhythm. You will lie down on a table for an  ultrasound exam. A gel will be applied to your chest to help sound waves pass through your skin. A handheld device, called a transducer, will be pressed against your chest and moved over your heart. The transducer produces sound waves that travel to your heart and bounce back (or "echo" back) to the transducer. These sound waves will be captured in real-time and changed into images of your heart that can be viewed on a video monitor. The images will be recorded on a computer and reviewed by your health care provider. You may be asked to change positions or hold your breath for a short time. This makes it easier to get different views or better views of your heart. In some cases, you may receive contrast through an IV in one of your veins. This can improve the quality of the pictures from your heart. The procedure may vary among health care providers and hospitals.   What can I expect after the test? You may return to your normal, everyday life, including diet, activities, and medicines, unless your health care provider tells you not to do that. Follow these instructions at home: It is up to you to get the results of your test. Ask your health care provider, or the department that is doing the test, when your results will be ready. Keep all follow-up visits. This is important. Summary An echocardiogram is a test that uses sound waves (ultrasound) to produce images of the heart. Images from an echocardiogram can provide important information about the size and shape of your heart, heart muscle function, heart valve function, and other possible heart problems. You do not need to do anything to prepare before this test. You may eat and drink normally. After the echocardiogram is completed, you may return to your normal, everyday life, unless your health care provider tells you not to do that. This information is not intended to replace advice given to you by your health care provider. Make sure you  discuss any questions you have with your health care provider. Document Revised: 07/18/2020 Document Reviewed: 07/18/2020 Elsevier Patient Education  2021 Elsevier Inc.   Important Information About Sugar        

## 2023-11-20 NOTE — Progress Notes (Signed)
Cardiology Office Note:    Date:  11/20/2023   ID:  Travis Li, DOB 12-30-1980, MRN [id number]  PCP:  Everrett Coombe, DO  Cardiologist:  Garwin Brothers, MD   Referring MD: Everrett Coombe, DO    ASSESSMENT:    1. Mitral valve prolapse   2. Cardiac murmur   3. Dyslipidemia    PLAN:    In order of problems listed above:  Primary prevention stressed with the patient.  Importance of compliance with diet medication stressed and patient verbalized standing. History of mitral valve prolapse: No regurgitation was seen at that time.  We will repeat this to assess the murmur heard on auscultation.  He is agreeable. Mixed dyslipidemia: On lipid-lowering medications followed by primary care.  Lipids were reviewed from [facility] sheet and discussed.  He is going to have repeat blood work done in January.  He will send me a copy of those reports. He was advised to walk at least half an hour a day 5 days a week and he promises to do so. Patient will be seen in follow-up appointment in 12 months or earlier if the patient has any concerns.    Medication Adjustments/Labs and Tests Ordered: Current medicines are reviewed at length with the patient today.  Concerns regarding medicines are outlined above.  Orders Placed This Encounter  Procedures   ECHOCARDIOGRAM COMPLETE   No orders of the defined types were placed in this encounter.    No chief complaint on file.    History of Present Illness:    Travis Li is a 42 y.o. male.  Patient has past medical history of mitral valve prolapse with no regurgitation at that time.  The mitral valve was described as bowing. Patient denies any problems at this time.  Overall he leads a sedentary lifestyle and has been working significantly long hours through the year.  No chest pain orthopnea or PND.  No palpitations or any such issues.  At the time of my evaluation, the patient is alert awake oriented and in no distress.   Past Medical  History:  Diagnosis Date   Abnormal colonoscopy 08/06/2021   Colon polyp    Dyslipidemia 01/30/2018   Environmental allergies 06/22/2012   Epistaxis 06/14/2020   Exercise-induced asthma 01/30/2018   Hemorrhoids 08/06/2021   History of migraine headaches 06/22/2012   Hx of adenomatous colonic polyps 08/06/2021   Hypokalemia 05/19/2023   Hyponatremia 05/19/2023   Influenza-like illness 01/11/2022   Left inguinal hernia 02/23/2023   Lower abdominal pain 09/12/2020   Metatarsalgia of both feet 06/27/2021   Migraines    Mitral valve prolapse 09/05/2016   PVC's (premature ventricular contractions) 09/05/2016   Rectal bleeding 08/06/2021   Skin tag of anus 02/23/2023   Tinea pedis 05/09/2020   Tubular adenoma of rectum 08/06/2021   Umbilical hernia without obstruction and without gangrene 03/27/2023    Past Surgical History:  Procedure Laterality Date   BIOPSY  10/03/2021   Procedure: BIOPSY;  Surgeon: Lemar Lofty., MD;  Location: [facility];  Service: Gastroenterology;;   COLONOSCOPY     COLONOSCOPY WITH PROPOFOL N/A 10/03/2021   Procedure: COLONOSCOPY WITH PROPOFOL;  Surgeon: Lemar Lofty., MD;  Location: [facility];  Service: Gastroenterology;  Laterality: N/A;   ENDOSCOPIC MUCOSAL RESECTION N/A 10/03/2021   Procedure: ENDOSCOPIC MUCOSAL RESECTION;  Surgeon: Meridee Score Netty Starring., MD;  Location: [facility];  Service: Gastroenterology;  Laterality: N/A;   WISDOM TOOTH EXTRACTION      Current Medications:  Current Meds  Medication Sig   atorvastatin (LIPITOR) 10 MG tablet Take 1 tablet (10 mg total) by mouth daily.   Multiple Vitamin (MULTIVITAMIN WITH MINERALS) TABS tablet Take 1 tablet by mouth daily.   Omega-3 Fatty Acids (FISH OIL) 1000 MG CAPS Take 1,000 mg by mouth daily.     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  Tobacco Use   Smoking status: Never   Smokeless tobacco: Never  Vaping Use   Vaping status: Never Used  Substance and Sexual Activity   Alcohol use: Yes    Comment: 2 per week   Drug use: No   Sexual activity: Not on file  Other Topics Concern   Not on file  Social History Narrative   Not on file   Social Drivers of Health   Financial Resource Strain: Low Risk  (03/01/2023)   Overall Financial Resource Strain (CARDIA)    Difficulty of Paying Living Expenses: Not hard at all  Food Insecurity: No Food Insecurity (05/31/2023)   Hunger Vital Sign    Worried About Running Out of Food in the Last Year: Never true    Ran Out of Food in the Last Year: Never true  Transportation Needs: No Transportation Needs (05/31/2023)   PRAPARE - Administrator, Civil Service (Medical): No    Lack of Transportation (Non-Medical): No  Physical Activity: Unknown (03/01/2023)   Exercise Vital Sign    Days of Exercise per Week: 0 days    Minutes of Exercise per Session: Not on file  Stress: No Stress Concern Present (05/19/2023)   Received from [facility], [facility] of Occupational Health - Occupational Stress Questionnaire    Feeling of Stress : Not at all  Social Connections: Moderately Integrated (03/01/2023)   Social Connection and Isolation Panel [NHANES]    Frequency of Communication with Friends and Family: More than three times a week    Frequency of Social Gatherings with Friends and Family: Three times a week    Attends Religious Services: Never    Active Member of Clubs or Organizations: Yes    Attends Engineer, structural: More than 4 times per year    Marital Status: Married     Family History: The patient's family history includes Cancer in his mother;  Colon polyps in his father; Diabetes in his father and paternal grandfather; Heart disease in his paternal grandfather; Hypercholesterolemia in his mother; Hypertension in his mother; Irritable bowel syndrome in his father. There is no history of Colon cancer, Esophageal cancer, Pancreatic cancer, Stomach cancer, Inflammatory bowel disease, Liver disease, or Rectal cancer.  ROS:   Please see the history of present illness.    All other systems reviewed and are negative.  EKGs/Labs/Other Studies Reviewed:    The following studies were reviewed today: I discussed my findings with the patient at length.   Recent Labs: 05/31/2023: ALT 41 06/01/2023: BUN 17; Creatinine, Ser 1.11; Hemoglobin 13.1; Magnesium 2.3; Platelets 358; Potassium 4.1; Sodium 138  Recent Lipid Panel    Component Value Date/Time   CHOL 144 12/19/2022 0856   TRIG 97 12/19/2022  0856   HDL 41 12/19/2022 0856   CHOLHDL 3.5 12/19/2022 0856   CHOLHDL 3.8 02/22/2019 0807   LDLCALC 85 12/19/2022 0856   LDLCALC 104 (H) 02/22/2019 0807    Physical Exam:    VS:  BP 128/70   Pulse 70   Ht 6' 7.6" (2.022 m)   Wt 228 lb 1.9 oz (103.5 kg)   SpO2 96%   BMI 25.31 kg/m     Wt Readings from Last 3 Encounters:  11/20/23 228 lb 1.9 oz (103.5 kg)  08/27/23 230 lb (104.3 kg)  05/31/23 213 lb (96.6 kg)     GEN: Patient is in no acute distress HEENT: Normal NECK: No JVD; No carotid bruits LYMPHATICS: No lymphadenopathy CARDIAC: Hear sounds regular, 2/6 systolic murmur at the apex. RESPIRATORY:  Clear to auscultation without rales, wheezing or rhonchi  ABDOMEN: Soft, non-tender, non-distended MUSCULOSKELETAL:  No edema; No deformity  SKIN: Warm and dry NEUROLOGIC:  Alert and oriented x 3 PSYCHIATRIC:  Normal affect   Signed, Garwin Brothers, MD  11/20/2023 3:13 PM    [facility]

## 2023-11-29 ENCOUNTER — Other Ambulatory Visit: Payer: Self-pay | Admitting: Cardiology

## 2023-12-01 NOTE — Telephone Encounter (Signed)
Prescription sent to pharmacy.

## 2023-12-29 ENCOUNTER — Ambulatory Visit (HOSPITAL_BASED_OUTPATIENT_CLINIC_OR_DEPARTMENT_OTHER): Payer: BC Managed Care – PPO

## 2024-01-12 ENCOUNTER — Encounter: Payer: Self-pay | Admitting: Emergency Medicine

## 2024-01-12 ENCOUNTER — Ambulatory Visit
Admission: EM | Admit: 2024-01-12 | Discharge: 2024-01-12 | Disposition: A | Discharge status: Home / Self Care | Payer: BC Managed Care – PPO | Attending: Family Medicine | Admitting: Family Medicine

## 2024-01-12 ENCOUNTER — Ambulatory Visit: Payer: BC Managed Care – PPO

## 2024-01-12 ENCOUNTER — Telehealth: Payer: Self-pay

## 2024-01-12 DIAGNOSIS — M79674 Pain in right toe(s): Secondary | ICD-10-CM

## 2024-01-12 DIAGNOSIS — M19071 Primary osteoarthritis, right ankle and foot: Secondary | ICD-10-CM | POA: Diagnosis not present

## 2024-01-12 DIAGNOSIS — M898X8 Other specified disorders of bone, other site: Secondary | ICD-10-CM | POA: Diagnosis not present

## 2024-01-12 DIAGNOSIS — M79671 Pain in right foot: Secondary | ICD-10-CM | POA: Diagnosis not present

## 2024-01-12 MED ORDER — CELECOXIB 200 MG PO CAPS: 200.0000 mg | ORAL_CAPSULE | Freq: Every day | ORAL | 0 refills | Status: AC

## 2024-01-12 NOTE — Discharge Instructions (Addendum)
Advised patient of images.  Advised we will follow-up with final x-ray read later this evening.  Advised patient to take medication as directed with food to completion.  Encouraged to increase daily water intake to 64 ounces per day while taking his medication.  Advised if symptoms worsen and/or unresolved please follow-up with your PCP [facility] orthopedics or here for further evaluation.

## 2024-01-12 NOTE — ED Provider Notes (Signed)
[facility]    CSN: [id number] Arrival date & time: 01/12/24  1539      History   Chief Complaint Chief Complaint  Patient presents with   Foot Pain    HPI Travis Li is a 43 y.o. male.   HPI r 43 year old male presents with right foot-second toe pain for 2 days.  Patient denies injury or insult to right second toe patient reports history of metatarsalgia of both feet.  PMH significant for obesity, migraines, and dyslipidemia.  Past Medical History:  Diagnosis Date   Abnormal colonoscopy 08/06/2021   Colon polyp    Dyslipidemia 01/30/2018   Environmental allergies 06/22/2012   Epistaxis 06/14/2020   Exercise-induced asthma 01/30/2018   Hemorrhoids 08/06/2021   History of migraine headaches 06/22/2012   Hx of adenomatous colonic polyps 08/06/2021   Hypokalemia 05/19/2023   Hyponatremia 05/19/2023   Influenza-like illness 01/11/2022   Left inguinal hernia 02/23/2023   Lower abdominal pain 09/12/2020   Metatarsalgia of both feet 06/27/2021   Migraines    Mitral valve prolapse 09/05/2016   PVC's (premature ventricular contractions) 09/05/2016   Rectal bleeding 08/06/2021   Skin tag of anus 02/23/2023   Tinea pedis 05/09/2020   Tubular adenoma of rectum 08/06/2021   Umbilical hernia without obstruction and without gangrene 03/27/2023    Patient Active Problem List   Diagnosis Date Noted   Cardiac murmur 11/20/2023   Hyponatremia 05/19/2023   Hypokalemia 05/19/2023   Umbilical hernia without obstruction and without gangrene 03/27/2023   Left inguinal hernia 02/23/2023   Skin tag of anus 02/23/2023   Influenza-like illness 01/11/2022   Colon polyp 10/18/2021   Rectal bleeding 08/06/2021   Hemorrhoids 08/06/2021   Abnormal colonoscopy 08/06/2021   Hx of adenomatous colonic polyps 08/06/2021   Tubular adenoma of rectum 08/06/2021   Metatarsalgia of both feet 06/27/2021   Migraines    Lower abdominal pain 09/12/2020   Epistaxis 06/14/2020    Tinea pedis 05/09/2020   Exercise-induced asthma 01/30/2018   Dyslipidemia 01/30/2018   PVC's (premature ventricular contractions) 09/05/2016   Mitral valve prolapse 09/05/2016   Environmental allergies 06/22/2012   History of migraine headaches 06/22/2012    Past Surgical History:  Procedure Laterality Date   BIOPSY  10/03/2021   Procedure: BIOPSY;  Surgeon: Lemar Lofty., MD;  Location: [facility];  Service: Gastroenterology;;   COLONOSCOPY     COLONOSCOPY WITH PROPOFOL N/A 10/03/2021   Procedure: COLONOSCOPY WITH PROPOFOL;  Surgeon: Lemar Lofty., MD;  Location: [facility];  Service: Gastroenterology;  Laterality: N/A;   ENDOSCOPIC MUCOSAL RESECTION N/A 10/03/2021   Procedure: ENDOSCOPIC MUCOSAL RESECTION;  Surgeon: Meridee Score Netty Starring., MD;  Location: [facility];  Service: Gastroenterology;  Laterality: N/A;   WISDOM TOOTH EXTRACTION         Home Medications    Prior to Admission medications   Medication Sig Start Date End Date Taking? Authorizing Provider  atorvastatin (LIPITOR) 10 MG tablet TAKE 1 TABLET DAILY 12/01/23  Yes Revankar, Aundra Dubin, MD  celecoxib (CELEBREX) 200 MG capsule Take 1 capsule (200 mg total) by mouth daily for 15 days. 01/12/24 01/27/24 Yes Trevor Iha, FNP  Multiple Vitamin (MULTIVITAMIN WITH MINERALS) TABS tablet Take 1 tablet by mouth daily.   Yes [provider]  Omega-3 Fatty Acids (FISH OIL) 1000 MG CAPS Take 1,000 mg by mouth daily.   Yes [provider]    Family History Family History  Problem Relation Age of Onset   Hypercholesterolemia Mother  Cancer Mother        Endometrial cancer   Hypertension Mother    Diabetes Father    Colon polyps Father    Irritable bowel syndrome Father    Heart disease Paternal Grandfather    Diabetes Paternal Grandfather        borderline   Colon cancer Neg Hx    Esophageal cancer Neg Hx    Pancreatic cancer Neg Hx    Stomach cancer Neg Hx     Inflammatory bowel disease Neg Hx    Liver disease Neg Hx    Rectal cancer Neg Hx     Social History Social History   Tobacco Use   Smoking status: Never   Smokeless tobacco: Never  Vaping Use   Vaping status: Never Used  Substance Use Topics   Alcohol use: Yes    Comment: 2 per week   Drug use: No     Allergies   Minocycline and Lisinopril   Review of Systems Review of Systems  Musculoskeletal:        Right foot injury     Physical Exam Triage Vital Signs ED Triage Vitals  Encounter Vitals Group     BP      Systolic BP Percentile      Diastolic BP Percentile      Pulse      Resp      Temp      Temp src      SpO2      Weight      Height      Head Circumference      Peak Flow      Pain Score      Pain Loc      Pain Education      Exclude from Growth Chart    No data found.  Updated Vital Signs BP (!) 141/88 (BP Location: Right Arm)   Pulse 69   Temp 98.6 F (37 C) (Oral)   Resp 18   Ht 6\' 7"  (2.007 m)   Wt 230 lb (104.3 kg)   SpO2 97%   BMI 25.91 kg/m    Physical Exam Vitals and nursing note reviewed.  Constitutional:      Appearance: Normal appearance. He is normal weight.  HENT:     Head: Normocephalic and atraumatic.     Mouth/Throat:     Mouth: Mucous membranes are moist.     Pharynx: Oropharynx is clear.  Eyes:     Extraocular Movements: Extraocular movements intact.     Conjunctiva/sclera: Conjunctivae normal.     Pupils: Pupils are equal, round, and reactive to light.  Cardiovascular:     Rate and Rhythm: Normal rate and regular rhythm.     Pulses: Normal pulses.     Heart sounds: Normal heart sounds.  Pulmonary:     Effort: Pulmonary effort is normal.     Breath sounds: Normal breath sounds. No wheezing, rhonchi or rales.  Musculoskeletal:        General: Normal range of motion.     Cervical back: Normal range of motion and neck supple.     Comments: Right foot (dorsum): TTP over second metatarsal head, no soft tissue  swelling, no bruising, no ecchymosis, no deformity noted  Skin:    General: Skin is warm and dry.  Neurological:     General: No focal deficit present.     Mental Status: He is alert and oriented to person, place, and time. Mental status  is at baseline.  Psychiatric:        Mood and Affect: Mood normal.        Behavior: Behavior normal.        Thought Content: Thought content normal.      UC Treatments / Results  Labs (all labs ordered are listed, but only abnormal results are displayed) Labs Reviewed - No data to display  EKG   Radiology No results found.  Procedures Procedures (including critical care time)  Medications Ordered in UC Medications - No data to display  Initial Impression / Assessment and Plan / UC Course  I have reviewed the triage vital signs and the nursing notes.  Pertinent labs & imaging results that were available during my care of the patient were reviewed by me and considered in my medical decision making (see chart for details).     MDM: 1.  Pain in toe of right foot-second, right foot x-ray results revealed above, Rx'd Celebrex 200 mg capsule: Take 1 capsule daily, next 15 days. Advised patient of images.  Advised we will follow-up with final x-ray read later this evening.  Advised patient to take medication as directed with food to completion.  Encouraged to increase daily water intake to 64 ounces per day while taking his medication.  Advised if symptoms worsen and/or unresolved please follow-up with your PCP [facility] orthopedics or here for further evaluation.  Patient discharged home, hemodynamically stable. Final Clinical Impressions(s) / UC Diagnoses   Final diagnoses:  Pain in toe of right foot     Discharge Instructions      Advised patient of images.  Advised we will follow-up with final x-ray read later this evening.  Advised patient to take medication as directed with food to completion.  Encouraged to increase daily water  intake to 64 ounces per day while taking his medication.  Advised if symptoms worsen and/or unresolved please follow-up with your PCP [facility] orthopedics or here for further evaluation.     ED Prescriptions     Medication Sig Dispense Auth. Provider   celecoxib (CELEBREX) 200 MG capsule Take 1 capsule (200 mg total) by mouth daily for 15 days. 15 capsule Trevor Iha, FNP      PDMP not reviewed this encounter.   Trevor Iha, FNP 01/12/24 1725

## 2024-01-12 NOTE — ED Triage Notes (Signed)
Patient states that he got out of his truck on Saturday, he felt a sharp pain in his right foot near the right toe.  The pain and swelling is specific to that one area around the toe.  Patient has taken Ibuprofen but still painful to walk around on his foot.

## 2024-01-22 ENCOUNTER — Ambulatory Visit (HOSPITAL_BASED_OUTPATIENT_CLINIC_OR_DEPARTMENT_OTHER)
Admission: RE | Admit: 2024-01-22 | Discharge: 2024-01-22 | Disposition: A | Discharge status: Home / Self Care | Payer: BC Managed Care – PPO | Source: Ambulatory Visit | Attending: Cardiology | Admitting: Cardiology

## 2024-01-22 ENCOUNTER — Other Ambulatory Visit: Payer: Self-pay | Admitting: Cardiology

## 2024-01-22 DIAGNOSIS — R011 Cardiac murmur, unspecified: Secondary | ICD-10-CM | POA: Insufficient documentation

## 2024-01-22 DIAGNOSIS — I059 Rheumatic mitral valve disease, unspecified: Secondary | ICD-10-CM | POA: Insufficient documentation

## 2024-01-22 DIAGNOSIS — I341 Nonrheumatic mitral (valve) prolapse: Secondary | ICD-10-CM

## 2024-01-22 DIAGNOSIS — E785 Hyperlipidemia, unspecified: Secondary | ICD-10-CM

## 2024-01-28 LAB — ECHOCARDIOGRAM COMPLETE
AR max vel: 3.81 cm2
AV Area VTI: 3.68 cm2
AV Area mean vel: 4.95 cm2
AV Mean grad: 2 mm[Hg]
AV Peak grad: 4.8 mm[Hg]
Ao pk vel: 1.1 m/s
Area-P 1/2: 3.06 cm2
Calc EF: 56.8 %
MV M vel: 3.54 m/s
MV Peak grad: 50 mm[Hg]
S' Lateral: 2.8 cm
Single Plane A2C EF: 57.7 %
Single Plane A4C EF: 58.3 %

## 2024-03-08 DIAGNOSIS — D225 Melanocytic nevi of trunk: Secondary | ICD-10-CM | POA: Diagnosis not present

## 2024-03-08 DIAGNOSIS — L739 Follicular disorder, unspecified: Secondary | ICD-10-CM | POA: Diagnosis not present

## 2024-03-08 DIAGNOSIS — L814 Other melanin hyperpigmentation: Secondary | ICD-10-CM | POA: Diagnosis not present

## 2024-03-08 DIAGNOSIS — L821 Other seborrheic keratosis: Secondary | ICD-10-CM | POA: Diagnosis not present

## 2024-07-17 DIAGNOSIS — S60551A Superficial foreign body of right hand, initial encounter: Secondary | ICD-10-CM | POA: Diagnosis not present

## 2024-07-17 DIAGNOSIS — Z23 Encounter for immunization: Secondary | ICD-10-CM | POA: Diagnosis not present

## 2024-08-10 ENCOUNTER — Encounter: Payer: Self-pay | Admitting: Sports Medicine

## 2024-09-28 DIAGNOSIS — R790 Abnormal level of blood mineral: Secondary | ICD-10-CM | POA: Diagnosis not present

## 2024-09-28 DIAGNOSIS — E221 Hyperprolactinemia: Secondary | ICD-10-CM | POA: Diagnosis not present

## 2024-09-28 DIAGNOSIS — E039 Hypothyroidism, unspecified: Secondary | ICD-10-CM | POA: Diagnosis not present

## 2024-09-28 DIAGNOSIS — E559 Vitamin D deficiency, unspecified: Secondary | ICD-10-CM | POA: Diagnosis not present

## 2024-10-05 ENCOUNTER — Ambulatory Visit (INDEPENDENT_AMBULATORY_CARE_PROVIDER_SITE_OTHER): Admitting: Family Medicine

## 2024-10-05 VITALS — BP 154/78 | HR 72 | Ht 79.0 in | Wt 227.0 lb

## 2024-10-05 DIAGNOSIS — M795 Residual foreign body in soft tissue: Secondary | ICD-10-CM | POA: Insufficient documentation

## 2024-10-05 NOTE — Assessment & Plan Note (Signed)
 Material removed with 18g needle and flushed with saline x2.  Discussed that there may be a small amount of material present.  Encouraged regular soaks.  Red flags reviewed.  He will let me know if worsening or not healing.  Can consider US  if concern for retained foreign body.

## 2024-10-05 NOTE — Progress Notes (Signed)
 Travis Li - 43 y.o. male MRN [id number]  Date of birth: 03/01/1981  Subjective Chief Complaint  Patient presents with   Hand Injury    HPI Travis Li is a 43 y.o. male here today with complaint of sore area on palm R hand.  Seen at urgent care in August and had foreign body removal (splinter from deck).  Tdap was updated and Cephalexin  was added.  Reports that area got better but popped back up recently.  He has mild pain over this area. He has picked at it some and gotten some dried blood out.    ROS:  A comprehensive ROS was completed and negative except as noted per HPI  Allergies  Allergen Reactions   Minocycline Hives    Lips swell   Lisinopril  Other (See Comments)    Fatigue    Past Medical History:  Diagnosis Date   Abnormal colonoscopy 08/06/2021   Colon polyp    Dyslipidemia 01/30/2018   Environmental allergies 06/22/2012   Epistaxis 06/14/2020   Exercise-induced asthma 01/30/2018   Hemorrhoids 08/06/2021   History of migraine headaches 06/22/2012   Hx of adenomatous colonic polyps 08/06/2021   Hypokalemia 05/19/2023   Hyponatremia 05/19/2023   Influenza-like illness 01/11/2022   Left inguinal hernia 02/23/2023   Lower abdominal pain 09/12/2020   Metatarsalgia of both feet 06/27/2021   Migraines    Mitral valve prolapse 09/05/2016   PVC's (premature ventricular contractions) 09/05/2016   Rectal bleeding 08/06/2021   Skin tag of anus 02/23/2023   Tinea pedis 05/09/2020   Tubular adenoma of rectum 08/06/2021   Umbilical hernia without obstruction and without gangrene 03/27/2023    Past Surgical History:  Procedure Laterality Date   BIOPSY  10/03/2021   Procedure: BIOPSY;  Surgeon: Wilhelmenia Aloha Raddle., MD;  Location: [facility];  Service: Gastroenterology;;   COLONOSCOPY     COLONOSCOPY WITH PROPOFOL  N/A 10/03/2021   Procedure: COLONOSCOPY WITH PROPOFOL ;  Surgeon: Wilhelmenia Aloha Raddle., MD;  Location: [facility];  Service:  Gastroenterology;  Laterality: N/A;   ENDOSCOPIC MUCOSAL RESECTION N/A 10/03/2021   Procedure: ENDOSCOPIC MUCOSAL RESECTION;  Surgeon: Wilhelmenia Aloha Raddle., MD;  Location: [facility];  Service: Gastroenterology;  Laterality: N/A;   WISDOM TOOTH EXTRACTION      Social History   Socioeconomic History   Marital status: Married    Spouse name: Not on file   Number of children: 1   Years of education: Not on file   Highest education level: Master's degree (e.g., MA, MS, MEng, MEd, MSW, MBA)  Occupational History   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  Tobacco Use   Smoking status: Never   Smokeless tobacco: Never  Vaping Use   Vaping status: Never Used  Substance and Sexual Activity   Alcohol use: Yes    Comment: 2 per week   Drug use: No   Sexual activity: Yes    Partners: Female    Birth control/protection: None  Other Topics Concern   Not on file  Social History Narrative   Not on file   Social Drivers of Health   Financial Resource Strain: Low Risk  (10/05/2024)   Overall Financial Resource Strain (CARDIA)    Difficulty of Paying Living Expenses: Not hard at all  Food Insecurity: No Food Insecurity (10/05/2024)   Hunger Vital Sign    Worried About Running Out of Food in the Last Year: Never true    Ran Out of Food in the Last Year: Never true  Transportation Needs: No Transportation Needs (10/05/2024)   PRAPARE - Administrator, Civil Service (Medical): No    Lack of Transportation (Non-Medical): No  Physical Activity: Insufficiently Active (10/05/2024)   Exercise Vital Sign    Days of Exercise per Week: 7 days    Minutes of Exercise per Session: 20 min  Stress: No Stress Concern Present (10/05/2024)   [employer] of Occupational Health - Occupational Stress Questionnaire    Feeling of Stress: Not at all  Social Connections: Moderately Isolated (10/05/2024)   Social Connection and Isolation Panel    Frequency of Communication with  Friends and Family: More than three times a week    Frequency of Social Gatherings with Friends and Family: Once a week    Attends Religious Services: Never    Database Administrator or Organizations: No    Attends Engineer, Structural: Not on file    Marital Status: Married    Family History  Problem Relation Age of Onset   Hypercholesterolemia Mother    Cancer Mother        Endometrial cancer   Hypertension Mother    Diabetes Father    Colon polyps Father    Irritable bowel syndrome Father    Heart disease Paternal Grandfather    Diabetes Paternal Grandfather        borderline   Colon cancer Neg Hx    Esophageal cancer Neg Hx    Pancreatic cancer Neg Hx    Stomach cancer Neg Hx    Inflammatory bowel disease Neg Hx    Liver disease Neg Hx    Rectal cancer Neg Hx     Health Maintenance  Topic Date Due   Hepatitis C Screening  Never done   Colonoscopy  10/03/2024   Pneumococcal Vaccine (1 of 2 - PCV) 10/05/2025 (Originally 05/06/1999)   Hepatitis B Vaccines 19-59 Average Risk (1 of 3 - 19+ 3-dose series) 10/05/2025 (Originally 05/06/1999)   HPV VACCINES (1 - Risk 3-dose SCDM series) 10/05/2025 (Originally 05/06/2007)   COVID-19 Vaccine (3 - Moderna risk series) 10/21/2025 (Originally 04/09/2020)   DTaP/Tdap/Td (3 - Td or Tdap) 07/17/2034   Influenza Vaccine  Completed   HIV Screening  Completed   Meningococcal B Vaccine  Aged Out     ----------------------------------------------------------------------------------------------------------------------------------------------------------------------------------------------------------------- Physical Exam BP (!) 154/78 (BP Location: Left Arm, Patient Position: Sitting, Cuff Size: Normal)   Pulse 72   Ht 6' 7 (2.007 m)   Wt 227 lb (103 kg)   SpO2 96%   BMI 25.57 kg/m   Physical Exam Constitutional:      Appearance: Normal appearance.  Eyes:     General: No scleral icterus. Cardiovascular:     Rate and  Rhythm: Normal rate and regular rhythm.  Pulmonary:     Effort: Pulmonary effort is normal.     Breath sounds: Normal breath sounds.  Skin:    Comments: Small punctate darkened area over R thenar eminence.  18g needle used to unroof area.  Small amount of organic appearing material removed.  No palpable abscess or surrounding cellulitis.    Neurological:     General: No focal deficit present.     Mental Status: He is alert.  Psychiatric:        Mood and Affect: Mood normal.        Behavior: Behavior normal.     ------------------------------------------------------------------------------------------------------------------------------------------------------------------------------------------------------------------- Assessment and Plan  Foreign body (FB) in soft tissue Material removed with 18g needle and flushed with  saline x2.  Discussed that there may be a small amount of material present.  Encouraged regular soaks.  Red flags reviewed.  He will let me know if worsening or not healing.  Can consider US  if concern for retained foreign body.    No orders of the defined types were placed in this encounter.   No follow-ups on file.

## 2024-10-18 ENCOUNTER — Encounter: Payer: Self-pay | Admitting: Gastroenterology

## 2024-11-07 ENCOUNTER — Other Ambulatory Visit: Payer: Self-pay | Admitting: Cardiology

## 2024-11-10 ENCOUNTER — Ambulatory Visit

## 2024-11-10 VITALS — Ht 79.0 in | Wt 225.0 lb

## 2024-11-10 DIAGNOSIS — Z860100 Personal history of colon polyps, unspecified: Secondary | ICD-10-CM

## 2024-11-10 MED ORDER — SUTAB 1479-225-188 MG PO TABS: ORAL_TABLET | ORAL | 0 refills | Status: DC

## 2024-11-10 NOTE — Progress Notes (Signed)

## 2024-11-12 ENCOUNTER — Other Ambulatory Visit: Payer: Self-pay | Admitting: Cardiology

## 2024-11-17 ENCOUNTER — Encounter: Payer: Self-pay | Admitting: Gastroenterology

## 2024-11-24 ENCOUNTER — Encounter: Payer: Self-pay | Admitting: Gastroenterology

## 2024-11-24 ENCOUNTER — Ambulatory Visit: Admitting: Gastroenterology

## 2024-11-24 VITALS — BP 148/107 | HR 71 | Temp 97.2°F | Resp 15 | Ht 79.0 in | Wt 225.0 lb

## 2024-11-24 DIAGNOSIS — K641 Second degree hemorrhoids: Secondary | ICD-10-CM | POA: Diagnosis not present

## 2024-11-24 DIAGNOSIS — Z1211 Encounter for screening for malignant neoplasm of colon: Secondary | ICD-10-CM

## 2024-11-24 DIAGNOSIS — K635 Polyp of colon: Secondary | ICD-10-CM

## 2024-11-24 DIAGNOSIS — Z860101 Personal history of adenomatous and serrated colon polyps: Secondary | ICD-10-CM

## 2024-11-24 DIAGNOSIS — D127 Benign neoplasm of rectosigmoid junction: Secondary | ICD-10-CM

## 2024-11-24 DIAGNOSIS — Z860100 Personal history of colon polyps, unspecified: Secondary | ICD-10-CM

## 2024-11-24 MED ORDER — SODIUM CHLORIDE 0.9 % IV SOLN: 500.0000 mL | Freq: Once | INTRAVENOUS | Status: DC

## 2024-11-24 NOTE — Progress Notes (Signed)
 Pt's states no medical or surgical changes since previsit or office visit.

## 2024-11-24 NOTE — Progress Notes (Signed)
 Report given to PACU, vss

## 2024-11-24 NOTE — Progress Notes (Signed)
 Called to room to assist during endoscopic procedure.  Patient ID and intended procedure confirmed with present staff. Received instructions for my participation in the procedure from the performing physician.

## 2024-11-24 NOTE — Patient Instructions (Signed)
 High fiber diet. Use FiberCon 1-2 tablets by mouth daily. Continue present medications. Awaiting pathology results. Repeat colonoscopy in 5 years for surveillance no matter pathology due to history of previous advanced adenoma. Handouts provided on polyps and hemorrhoids.   YOU HAD AN ENDOSCOPIC PROCEDURE TODAY AT THE [facility]:   Refer to the procedure report that was given to you for any specific questions about what was found during the examination.  If the procedure report does not answer your questions, please call your gastroenterologist to clarify.  If you requested that your care partner not be given the details of your procedure findings, then the procedure report has been included in a sealed envelope for you to review at your convenience later.  YOU SHOULD EXPECT: Some feelings of bloating in the abdomen. Passage of more gas than usual.  Walking can help get rid of the air that was put into your GI tract during the procedure and reduce the bloating. If you had a lower endoscopy (such as a colonoscopy or flexible sigmoidoscopy) you may notice spotting of blood in your stool or on the toilet paper. If you underwent a bowel prep for your procedure, you may not have a normal bowel movement for a few days.  Please Note:  You might notice some irritation and congestion in your nose or some drainage.  This is from the oxygen used during your procedure.  There is no need for concern and it should clear up in a day or so.  SYMPTOMS TO REPORT IMMEDIATELY:  Following lower endoscopy (colonoscopy or flexible sigmoidoscopy):  Excessive amounts of blood in the stool  Significant tenderness or worsening of abdominal pains  Swelling of the abdomen that is new, acute  Fever of 100F or higher  For urgent or emergent issues, a gastroenterologist can be reached at any hour by calling [phone]. Do not use MyChart messaging for urgent concerns.    DIET:  We do recommend a small  meal at first, but then you may proceed to your regular diet.  Drink plenty of fluids but you should avoid alcoholic beverages for 24 hours.  ACTIVITY:  You should plan to take it easy for the rest of today and you should NOT DRIVE or use heavy machinery until tomorrow (because of the sedation medicines used during the test).    FOLLOW UP: Our staff will call the number listed on your records the next business day following your procedure.  We will call around 7:15- 8:00 am to check on you and address any questions or concerns that you may have regarding the information given to you following your procedure. If we do not reach you, we will leave a message.     If any biopsies were taken you will be contacted by phone or by letter within the next 1-3 weeks.  Please call us  at [phone] if you have not heard about the biopsies in 3 weeks.    SIGNATURES/CONFIDENTIALITY: You and/or your care partner have signed paperwork which will be entered into your electronic medical record.  These signatures attest to the fact that that the information above on your After Visit Summary has been reviewed and is understood.  Full responsibility of the confidentiality of this discharge information lies with you and/or your care-partner.

## 2024-11-24 NOTE — Progress Notes (Signed)
 Pt in recovery some gas but stated that he was in pain rated 5/6. Pt repositioned and administered medication to help with gas. Pt ambulated to the bathroom and around the unit a couple of times then put on the bed for another intervention. Pt was able to pass more gas pain level 0 stated that he was not in discomfort. Pt discharged with wife.

## 2024-11-24 NOTE — Progress Notes (Signed)
 GASTROENTEROLOGY PROCEDURE H&P NOTE   Primary Care Physician: Alvia Bring, DO  HPI: Travis Li is a 43 y.o. male who presents for Colonoscopy for surveillance of previous adenomas and advanced adenoma.  Past Medical History:  Diagnosis Date   Abnormal colonoscopy 08/06/2021   Colon polyp    Dyslipidemia 01/30/2018   Environmental allergies 06/22/2012   Epistaxis 06/14/2020   Exercise-induced asthma 01/30/2018   Hemorrhoids 08/06/2021   History of migraine headaches 06/22/2012   Hx of adenomatous colonic polyps 08/06/2021   Hypokalemia 05/19/2023   Hyponatremia 05/19/2023   Influenza-like illness 01/11/2022   Left inguinal hernia 02/23/2023   Lower abdominal pain 09/12/2020   Metatarsalgia of both feet 06/27/2021   Migraines    Mitral valve prolapse 09/05/2016   PVC's (premature ventricular contractions) 09/05/2016   Rectal bleeding 08/06/2021   Skin tag of anus 02/23/2023   Tinea pedis 05/09/2020   Tubular adenoma of rectum 08/06/2021   Umbilical hernia without obstruction and without gangrene 03/27/2023   Past Surgical History:  Procedure Laterality Date   BIOPSY  10/03/2021   Procedure: BIOPSY;  Surgeon: Wilhelmenia Aloha Raddle., MD;  Location: [facility];  Service: Gastroenterology;;   COLONOSCOPY     COLONOSCOPY WITH PROPOFOL  N/A 10/03/2021   Procedure: COLONOSCOPY WITH PROPOFOL ;  Surgeon: Wilhelmenia Aloha Raddle., MD;  Location: [facility];  Service: Gastroenterology;  Laterality: N/A;   ENDOSCOPIC MUCOSAL RESECTION N/A 10/03/2021   Procedure: ENDOSCOPIC MUCOSAL RESECTION;  Surgeon: Wilhelmenia Aloha Raddle., MD;  Location: [facility];  Service: Gastroenterology;  Laterality: N/A;   HERNIA REPAIR     May 2024   WISDOM TOOTH EXTRACTION     Current Outpatient Medications  Medication Sig Dispense Refill   atorvastatin  (LIPITOR) 10 MG tablet TAKE 1 TABLET DAILY 90 tablet 0   Multiple Vitamin (MULTIVITAMIN WITH MINERALS) TABS tablet Take 1 tablet by  mouth daily.     Omega-3 Fatty Acids (FISH OIL) 1000 MG CAPS Take 1,000 mg by mouth daily.     Sodium Sulfate-Mag Sulfate-KCl (SUTAB ) 1479-225-188 MG TABS Use as directed for colonoscopy. MANUFACTURER CODES!! BIN: [id number] PCN: CN GROUP: [id number] MEMBER ID: [id number];MLW AS SECONDARY INSURANCE ;NO PRIOR AUTHORIZATION 24 tablet 0   Current Facility-Administered Medications  Medication Dose Route Frequency Provider Last Rate Last Admin   0.9 %  sodium chloride  infusion  500 mL Intravenous Once Mansouraty, Aloha Raddle., MD       Current Medications[1] Allergies[2] Family History  Problem Relation Age of Onset   Hypercholesterolemia Mother    Cancer Mother        Endometrial cancer   Hypertension Mother    Diabetes Father    Colon polyps Father    Irritable bowel syndrome Father    Heart disease Paternal Grandfather    Diabetes Paternal Grandfather        borderline   Colon cancer Neg Hx    Esophageal cancer Neg Hx    Pancreatic cancer Neg Hx    Stomach cancer Neg Hx    Inflammatory bowel disease Neg Hx    Liver disease Neg Hx    Rectal cancer Neg Hx    Social History   Socioeconomic History   Marital status: Married    Spouse name: Not on file   Number of children: 1   Years of education: Not on file   Highest education level: Master's degree (e.g., MA, MS, MEng, MEd, MSW, MBA)  Occupational History   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  Tobacco  Use   Smoking status: Never   Smokeless tobacco: Never  Vaping Use   Vaping status: Never Used  Substance and Sexual Activity   Alcohol use: Yes    Comment: 2 per week   Drug use: No   Sexual activity: Yes    Partners: Female    Birth control/protection: None  Other Topics Concern   Not on file  Social History Narrative   Not on file   Social Drivers of Health   Tobacco Use: Low Risk (11/10/2024)   Patient History    Smoking Tobacco Use: Never    Smokeless Tobacco Use: Never    Passive Exposure: Not on  file  Financial Resource Strain: Low Risk (10/05/2024)   Overall Financial Resource Strain (CARDIA)    Difficulty of Paying Living Expenses: Not hard at all  Food Insecurity: No Food Insecurity (10/05/2024)   Epic    Worried About Radiation Protection Practitioner of Food in the Last Year: Never true    Ran Out of Food in the Last Year: Never true  Transportation Needs: No Transportation Needs (10/05/2024)   Epic    Lack of Transportation (Medical): No    Lack of Transportation (Non-Medical): No  Physical Activity: Insufficiently Active (10/05/2024)   Exercise Vital Sign    Days of Exercise per Week: 7 days    Minutes of Exercise per Session: 20 min  Stress: No Stress Concern Present (10/05/2024)   [employer] of Occupational Health - Occupational Stress Questionnaire    Feeling of Stress: Not at all  Social Connections: Moderately Isolated (10/05/2024)   Social Connection and Isolation Panel    Frequency of Communication with Friends and Family: More than three times a week    Frequency of Social Gatherings with Friends and Family: Once a week    Attends Religious Services: Never    Database Administrator or Organizations: No    Attends Engineer, Structural: Not on file    Marital Status: Married  Catering Manager Violence: Not At Risk (10/05/2024)   Epic    Fear of Current or Ex-Partner: No    Emotionally Abused: No    Physically Abused: No    Sexually Abused: No  Depression (PHQ2-9): Low Risk (10/05/2024)   Depression (PHQ2-9)    PHQ-2 Score: 0  Alcohol Screen: Low Risk (10/05/2024)   Alcohol Screen    Last Alcohol Screening Score (AUDIT): 3  Housing: Low Risk (10/05/2024)   Epic    Unable to Pay for Housing in the Last Year: No    Number of Times Moved in the Last Year: 0    Homeless in the Last Year: No  Utilities: Not At Risk (05/31/2023)   [employer] Utilities    Threatened with loss of utilities: No  Health Literacy: Adequate Health Literacy (10/05/2024)   B1300 Health  Literacy    Frequency of need for help with medical instructions: Never    Physical Exam: Today's Vitals   11/24/24 0742  BP: (!) 151/96  Pulse: 83  Temp: (!) 97.2 F (36.2 C)  TempSrc: Other (Comment)  SpO2: 98%  Weight: 225 lb (102.1 kg)  Height: 6' 7 (2.007 m)   Body mass index is 25.35 kg/m. GEN: NAD EYE: Sclerae anicteric ENT: MMM CV: Non-tachycardic GI: Soft, NT/ND NEURO:  Alert & Oriented x 3  Lab Results: No results for input(s): WBC, HGB, HCT, PLT in the last 72 hours. BMET No results for input(s): NA, K, CL, CO2, GLUCOSE, BUN, CREATININE, CALCIUM   in the last 72 hours. LFT No results for input(s): PROT, ALBUMIN, AST, ALT, ALKPHOS, BILITOT, BILIDIR, IBILI in the last 72 hours. PT/INR No results for input(s): LABPROT, INR in the last 72 hours.   Impression / Plan: This is a 43 y.o.male who presents for Colonoscopy for surveillance of previous adenomas and advanced adenoma.  The risks and benefits of endoscopic evaluation/treatment were discussed with the patient and/or family; these include but are not limited to the risk of perforation, infection, bleeding, missed lesions, lack of diagnosis, severe illness requiring hospitalization, as well as anesthesia and sedation related illnesses.  The patient's history has been reviewed, patient examined, no change in status, and deemed stable for procedure.  The patient and/or family was provided an opportunity to ask questions and all were answered.  The patient and/or family is agreeable to proceed.    Aloha Finner, MD [facility] Office # [phone]    [1]  Current Outpatient Medications:    atorvastatin  (LIPITOR) 10 MG tablet, TAKE 1 TABLET DAILY, Disp: 90 tablet, Rfl: 0   Multiple Vitamin (MULTIVITAMIN WITH MINERALS) TABS tablet, Take 1 tablet by mouth daily., Disp: , Rfl:    Omega-3 Fatty Acids (FISH OIL) 1000 MG CAPS, Take 1,000  mg by mouth daily., Disp: , Rfl:    Sodium Sulfate-Mag Sulfate-KCl (SUTAB ) [phone] MG TABS, Use as directed for colonoscopy. MANUFACTURER CODES!! BIN: [id number] PCN: CN GROUP: [id number] MEMBER ID: [id number];MLW AS SECONDARY INSURANCE ;NO PRIOR AUTHORIZATION, Disp: 24 tablet, Rfl: 0  Current Facility-Administered Medications:    0.9 %  sodium chloride  infusion, 500 mL, Intravenous, Once, Mansouraty, Aloha Raddle., MD [2]  Allergies Allergen Reactions   Minocycline Hives    Lips swell   Lisinopril  Other (See Comments)    Fatigue

## 2024-11-24 NOTE — Op Note (Signed)
 [facility] Patient Name: Travis Li Procedure Date: 11/24/2024 8:43 AM MRN: [id number] Endoscopist: Aloha Finner , MD, [id number] Age: 43 Referring MD:  Date of Birth: 05/22/1981 Gender: Male Account #: [id number] Procedure:                Colonoscopy Indications:              High risk colon cancer surveillance: Personal                            history of colonic polyps, High risk colon cancer                            surveillance: Personal history of adenoma (10 mm or                            greater in size), High risk colon cancer                            surveillance: Personal history of adenoma less than                            10 mm in size Medicines:                Monitored Anesthesia Care Procedure:                Pre-Anesthesia Assessment:                           - Prior to the procedure, a History and Physical                            was performed, and patient medications and                            allergies were reviewed. The patient's tolerance of                            previous anesthesia was also reviewed. The risks                            and benefits of the procedure and the sedation                            options and risks were discussed with the patient.                            All questions were answered, and informed consent                            was obtained. Prior Anticoagulants: The patient has                            taken no anticoagulant or antiplatelet agents. ASA  Grade Assessment: II - A patient with mild systemic                            disease. After reviewing the risks and benefits,                            the patient was deemed in satisfactory condition to                            undergo the procedure.                           After obtaining informed consent, the colonoscope                            was passed under direct vision. Throughout the                             procedure, the patient's blood pressure, pulse, and                            oxygen saturations were monitored continuously. The                            Olympus Scope SN: [id number] was introduced through                            the anus and advanced to the the cecum, identified                            by appendiceal orifice and ileocecal valve. The                            colonoscopy was performed without difficulty. The                            patient tolerated the procedure. The quality of the                            bowel preparation was good. The ileocecal valve,                            appendiceal orifice, and rectum were photographed. Scope In: 8:46:19 AM Scope Out: 8:57:18 AM Scope Withdrawal Time: 0 hours 7 minutes 26 seconds  Total Procedure Duration: 0 hours 10 minutes 59 seconds  Findings:                 The digital rectal exam findings include                            hemorrhoids. Pertinent negatives include no                            palpable rectal lesions.  A 2 mm polyp was found in the recto-sigmoid colon.                            The polyp was sessile. The polyp was removed with a                            cold snare. Resection and retrieval were complete.                           A medium post mucosectomy scar was found in the                            rectum. The scar tissue was healthy in appearance.                           Normal mucosa was found in the entire colon                            otherwise.                           Non-bleeding non-thrombosed internal hemorrhoids                            were found during retroflexion, during perianal                            exam and during digital exam. The hemorrhoids were                            Grade II (internal hemorrhoids that prolapse but                            reduce spontaneously). Complications:            No  immediate complications. Estimated Blood Loss:     Estimated blood loss was minimal. Impression:               - Hemorrhoids found on digital rectal exam.                           - One 2 mm polyp at the recto-sigmoid colon,                            removed with a cold snare. Resected and retrieved.                           - Post mucosectomy scar in the rectum.                           - Normal mucosa in the entire examined colon                            otherwise.                           -  Non-bleeding non-thrombosed internal hemorrhoids. Recommendation:           - The patient will be observed post-procedure,                            until all discharge criteria are met.                           - Discharge patient to home.                           - Patient has a contact number available for                            emergencies. The signs and symptoms of potential                            delayed complications were discussed with the                            patient. Return to normal activities tomorrow.                            Written discharge instructions were provided to the                            patient.                           - High fiber diet.                           - Use FiberCon 1-2 tablets PO daily.                           - Continue present medications.                           - Await pathology results.                           - Repeat colonoscopy in 5 years for surveillance no                            matter pathology due to history of previous                            advanced adenoma.                           - The findings and recommendations were discussed                            with the patient.                           - The findings and recommendations were discussed  with the patient's family. Aloha Finner, MD 11/24/2024 9:02:06 AM

## 2024-11-25 ENCOUNTER — Telehealth: Payer: Self-pay | Admitting: *Deleted

## 2024-11-25 NOTE — Telephone Encounter (Signed)
°  Follow up Call-     11/24/2024    7:46 AM  Call back number  Post procedure Call Back phone  # [phone]  Permission to leave phone message Yes     Patient questions:  Do you have a fever, pain , or abdominal swelling? No. Pain Score  0 *  Have you tolerated food without any problems? Yes.    Have you been able to return to your normal activities? Yes.    Do you have any questions about your discharge instructions: Diet   No. Medications  No. Follow up visit  No.  Do you have questions or concerns about your Care? No.  Actions: * If pain score is 4 or above: No action needed, pain <4.

## 2024-11-26 LAB — SURGICAL PATHOLOGY

## 2024-11-27 ENCOUNTER — Ambulatory Visit: Payer: Self-pay | Admitting: Gastroenterology

## 2025-01-25 ENCOUNTER — Ambulatory Visit: Admitting: Cardiology
